# Patient Record
Sex: Male | Born: 1960 | Race: White | Hispanic: No | Marital: Married | Smoking: Never smoker
Health system: Southern US, Community
[De-identification: ages and names within clinical notes are randomized; demographics above are authoritative.]

## PROBLEM LIST (undated history)

## (undated) DIAGNOSIS — F410 Panic disorder [episodic paroxysmal anxiety] without agoraphobia: Secondary | ICD-10-CM

## (undated) HISTORY — PX: OTHER SURGICAL HISTORY: SHX169

## (undated) HISTORY — PX: KNEE ARTHROSCOPY W/ ACL RECONSTRUCTION: SHX1858

---

## 2002-05-28 ENCOUNTER — Emergency Department (HOSPITAL_COMMUNITY): Admission: EM | Admit: 2002-05-28 | Discharge: 2002-05-28 | Payer: Self-pay | Admitting: *Deleted

## 2002-05-28 ENCOUNTER — Encounter: Payer: Self-pay | Admitting: *Deleted

## 2003-01-08 ENCOUNTER — Encounter: Payer: Self-pay | Admitting: Orthopaedic Surgery

## 2003-01-08 ENCOUNTER — Ambulatory Visit (HOSPITAL_COMMUNITY): Admission: RE | Admit: 2003-01-08 | Discharge: 2003-01-08 | Payer: Self-pay | Admitting: Orthopaedic Surgery

## 2003-02-05 ENCOUNTER — Inpatient Hospital Stay (HOSPITAL_COMMUNITY): Admission: EM | Admit: 2003-02-05 | Discharge: 2003-02-06 | Payer: Self-pay | Admitting: Emergency Medicine

## 2003-02-05 ENCOUNTER — Encounter: Payer: Self-pay | Admitting: Emergency Medicine

## 2003-02-06 ENCOUNTER — Encounter: Payer: Self-pay | Admitting: Cardiology

## 2003-02-06 ENCOUNTER — Encounter: Payer: Self-pay | Admitting: Internal Medicine

## 2003-06-12 ENCOUNTER — Inpatient Hospital Stay (HOSPITAL_COMMUNITY): Admission: RE | Admit: 2003-06-12 | Discharge: 2003-06-13 | Payer: Self-pay | Admitting: Orthopaedic Surgery

## 2003-07-16 ENCOUNTER — Encounter: Payer: Self-pay | Admitting: Family Medicine

## 2003-07-16 ENCOUNTER — Ambulatory Visit (HOSPITAL_COMMUNITY): Admission: RE | Admit: 2003-07-16 | Discharge: 2003-07-16 | Payer: Self-pay | Admitting: Family Medicine

## 2003-11-20 ENCOUNTER — Ambulatory Visit (HOSPITAL_COMMUNITY): Admission: RE | Admit: 2003-11-20 | Discharge: 2003-11-20 | Payer: Self-pay | Admitting: Orthopaedic Surgery

## 2005-03-18 ENCOUNTER — Ambulatory Visit (HOSPITAL_COMMUNITY): Admission: RE | Admit: 2005-03-18 | Discharge: 2005-03-18 | Payer: Self-pay | Admitting: Internal Medicine

## 2005-03-18 ENCOUNTER — Ambulatory Visit: Payer: Self-pay | Admitting: Internal Medicine

## 2009-09-25 ENCOUNTER — Ambulatory Visit (HOSPITAL_COMMUNITY): Admission: RE | Admit: 2009-09-25 | Discharge: 2009-09-25 | Payer: Self-pay | Admitting: Family Medicine

## 2011-04-24 NOTE — Consult Note (Signed)
NAME:  Mario Meyer, Mario Meyer                          ACCOUNT NO.:  [id number]   MEDICAL RECORD NO.:  [id number]                   PATIENT TYPE:  INP   LOCATION:  1829                                 FACILITY:  [facility]   PHYSICIAN:  Olga Millers, M.D. [facility]            DATE OF BIRTH:  04-02-1961   DATE OF CONSULTATION:  02/05/2003  DATE OF DISCHARGE:                                   CONSULTATION   The patient is a 50 year old male whom I am asked to evaluate for chest  pain.  He has no prior cardiac history.  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  He denies any exertional chest pain, dyspnea on  exertion, orthopnea, PND, pedal edema, palpitations, or syncope.  The  patient states that yesterday morning at approximately 3 a.m., he developed  left-sided chest pain in the left breast and shoulder area.  The pain was  described as a sharp sensation without shortness of breath or diaphoresis.  There was nausea but no vomiting.  The pain was not pleuritic or positional  and not related to food.  It was not related to exertion.  It has been  continuous since that time and is now 24 hours in duration.  Because of  these symptoms, he presented to the emergency room and was admitted by the  teaching service.  We were asked to further evaluate.   MEDICATIONS PRIOR TO ADMISSION:  None.   ALLERGIES:  No known drug allergies.   SOCIAL HISTORY:  He does not smoke, nor does he consume alcohol.   FAMILY HISTORY:  Negative for coronary artery disease.  His mother does have  colon cancer.   PAST MEDICAL HISTORY:  There is no diabetes mellitus or hypertension.  There  is a history of mild hyperlipidemia by his report, but he does not have  numbers.  He has had prior knee surgery.   REVIEW OF SYSTEMS:  He denies any headaches or fevers or chills.  There is  no productive cough or hemoptysis.  There is no dysphagia, odynophagia,  melena, or hematochezia.  There is no dysuria or  hematuria.  There is no  seizure activity.  There is no orthopnea, PND, or pedal edema.  The  remainder of the Review of Systems is negative.   PHYSICAL EXAMINATION:  VITAL SIGNS:  Blood pressure 123/63 in the right arm  with a pulse of 72.  In the left, it is 107/57 with a pulse of 77.  He is  afebrile.  GENERAL:  He is well developed, well nourished in no acute distress.  He  does not appear to be depressed.  He does not have peripheral clubbing.  HEENT:  Unremarkable with normal eyelids.  NECK:  Supple with normal upstroke bilaterally and no bruits noted.  There  is no jugular venous distention or thyromegaly.  CHEST:  Clear to auscultation.  Normal expansion.  CARDIOVASCULAR:  Regular rate and rhythm with a normal S1 and S2.  There are  no murmurs, gallops, or rubs noted.  Of note, he is tender to palpation over  the left chest and shoulder area, and he states this is reproducing the pain  that he presented with.  ABDOMEN:  Nontender, nondistended.  Positive bowel sounds.  No  hepatosplenomegaly and no mass appreciated.  There is no abdominal bruit.  He has 2+ femoral pulses bilaterally with no bruits.  EXTREMITIES:  No edema, and I can palpate no cords.  He has 2+ dorsalis  pedis pulses bilaterally.  NEUROLOGIC:  Grossly intact.   LABORATORY DATA:  Electrocardiogram shows a normal sinus rhythm at a rate of  71.  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   His chest x-ray shows no edema.   His D-dimer is negative.  His initial enzymes are negative.  His hemoglobin  and hematocrit are 16 and 46, respectively.  Potassium 3.9, BUN 18,  creatinine 1.2.   DIAGNOSES:  1. Atypical chest pain.  2. History of mild hyperlipidemia.   PLAN:  The patient presents with chest pain that is very atypical for  ischemia.  It was reproduced with palpation of the chest, and I think it is  most consistent with musculoskeletal pain.  His initial set of  enzymes are  negative, and we are awaiting followup enzymes.  If they are negative, we  will plan an outpatient Cardiolite for risk stratification.  He may also  benefit from a nonsteroidal.  As noted, his D-dimer is negative.                                                Olga Millers, M.D. [facility]    BC/MEDQ  D:  02/05/2003  T:  02/05/2003  Job:  [id number]

## 2011-04-24 NOTE — Op Note (Signed)
NAME:  Mario Meyer, Mario Meyer                          ACCOUNT NO.:  [id number]   MEDICAL RECORD NO.:  [id number]                   PATIENT TYPE:  AMB   LOCATION:  DAY                                  FACILITY:  [facility]   PHYSICIAN:  J. Darreld Mclean, M.D.              DATE OF BIRTH:  [birth year]   DATE OF PROCEDURE:  DATE OF DISCHARGE:                                 OPERATIVE REPORT   PREOPERATIVE DIAGNOSIS:  Tear of rotator cuff on the right.   POSTOPERATIVE DIAGNOSIS:  Tear of rotator cuff on the right.   PROCEDURE:  Neer acromioplasty, rotator cuff repair, right.   SURGEON:  J. Darreld Mclean, M.D.   ASSISTANT:  Candace Cruise, P.A.   ANESTHESIA:  General.   INDICATIONS FOR PROCEDURE:  The patient has had pain and tenderness to his  shoulder since October, 2003.  MRI in February showed an infraspinatus  tendon tear.  The patient has had continued pain and continued tenderness  and is not getting any better, and surgery is now recommended.   PHYSICIAN'S ASSISTANT:  The use of a physician's assistant was indicated and  medically necessary, as I am the only orthopedic surgeon on staff.  There is  no house staff, and I needed someone to help me with manipulation of the arm  and help with the placement of the sutures.   DESCRIPTION OF PROCEDURE:  The patient was given general anesthesia.  He was  placed on the operating room table and placed in the semi barber position.  He was prepped and draped in the usual fashion.  Before doing anything else,  we verified that we were doing [name] and doing his right shoulder.   An incision was made between the acromion and the coracoid with careful  dissection, and the deltoid was exposed.  A suture was placed 5 cm below the  [facility] joint to avoid any possible injury to the axillary nerve.  A muscle-  splitting incision was made very carefully atraumatically.  The  coracoacromial ligament was identified and cut.  The shoulder was tight, but  we  were able to visualize.  The patient did not have very much downsloping  of the acromion clinically as noted on the MRI, but clinically it did not  seem to be that bad.  We used the broad-based osteotome and removed the part  of the acromion as described by Neer and then used a power rasp to smooth  down the area.  There was no obvious full-thickness tear.  The rotator cuff  was opened, and there was a tear on the undersurface of the infraspinatus.  This was then brought forward, sharply debrided, and then repaired in a  modified McLaughlin with alternating #1 Surgilon suture.  Good repair was  obtained.  The patient had a good range of motion.  The deltoid was  reapproximated with an interrupted figure-of-eight chromic.  The  subcutaneous tissue was closed using 2-0 plain and then a running  subcuticular 3-0 nylon with Steri-Strips applied.  A sterile bulky dressing  applied.   The patient tolerated the procedure well and went to the recovery room in  good condition.  He was admitted for pain control with a PCA pump.                                               Teola Bradley, M.D.    JWK/MEDQ  D:  06/12/2003  T:  06/12/2003  Job:  [id number]

## 2011-04-24 NOTE — Op Note (Signed)
NAME:  Mario Meyer, CID                ACCOUNT NO.:  [id number]   MEDICAL RECORD NO.:  [id number]          PATIENT TYPE:  AMB   LOCATION:  DAY                           FACILITY:  [facility]   PHYSICIAN:  R. Roetta Sessions, M.D. DATE OF BIRTH:  1961/08/21   DATE OF PROCEDURE:  03/18/2005  DATE OF DISCHARGE:                                 OPERATIVE REPORT   PROCEDURE PERFORMED:  Colonoscopy, high risk screening.   INDICATIONS FOR PROCEDURE:  Mr. Piscopo is a 50 year old gentleman with no  bowel symptoms and no prior colon imaging who was referred courtesy of Dr.  Jorene Guest in [place] for colorectal cancer screening.  Family history is  significant in that his mother was diagnosed with colorectal cancer at age  62 and ultimately died of metastatic colorectal cancer.  Colonoscopy is now  being done as a high risk screening maneuver.  This approach has been  discussed with the patient at length.  Potential risks, benefits and  alternatives have been reviewed, questions answered.   PROCEDURE NOTE:  Oxygen saturations, blood pressure, pulse and respirations  were monitored throughout the entire procedure.  Conscious sedation was  Versed 5 mg IV, Demerol 100 mg IV in divided doses.   INSTRUMENT USED:  Olympus video chip system.   FINDINGS:  Digital rectal exam revealed no abnormalities.   ENDOSCOPIC FINDINGS:  Prep was good.   Rectum:  Examination of rectal mucosa including retroflex view of the anal  verge revealed no abnormalities.  Colon:  Colonic mucosa was surveyed from the rectosigmoid junction.  The  left, transverse, right colon, area of appendiceal orifice, ileocecal valve  and cecum.  These structures were well seen and photographed for the record.  From this level the scope was slowly withdrawn.  All previously mentioned  mucosal surfaces were again seen.  The colonic mucosa appeared normal.  The  patient tolerated the procedure well and was reacted in endoscopy.   IMPRESSION:   Normal rectum,  normal colon.   RECOMMENDATIONS:  Repeat screening colonoscopy in five years.    RMR/MEDQ  D:  03/18/2005  T:  03/18/2005  Job:  [id number]   cc:   Dr. Darrow Bussing, [place], [place]

## 2011-04-24 NOTE — Discharge Summary (Signed)
NAME:  Mario Meyer, Mario Meyer NO.:  [id number]   MEDICAL RECORD NO.:  [id number]                   PATIENT TYPE:  INP   LOCATION:  3731                                 FACILITY:  [facility]   PHYSICIAN:  Thayer Headings, M.D.               DATE OF BIRTH:  [birth year]   DATE OF ADMISSION:  02/05/2003  DATE OF DISCHARGE:  02/06/2003                                 DISCHARGE SUMMARY   PRIMARY CARE PHYSICIAN:  Patient's primary care physician is at [facility].   CONSULTATIONS:  Dr. Olga Millers of [facility].   DISCHARGE DIAGNOSES:  1. Chest pain, noncardiac in etiology, presumed likely secondary to     esophageal or musculoskeletal disease.  2. Borderline low TSH, thyroid stimulating hormone.   DISCHARGE MEDICATIONS:  1. Protonix 40 mg p.o. daily.  2. Aspirin 325 mg p.o. daily.  3. Naproxen 500 mg p.o. b.i.d. p.r.n. pain.  4. Sublingual nitroglycerin 0.4 mg p.o. SL p.r.n. chest pain.   DISPOSITION:  The patient was discharged home in good condition on February 06, 2003.  He will have an exercise Cardiolite study on Friday, March 5 at 12:30  p.m. with [facility].  He will also have a follow up appointment  with Dr. Olga Millers on March 24 at 10:00 a.m.  The patient was also  instructed to contact [facility] for a follow up  appointment in the next several weeks for hospital follow up and to follow  his borderline low TSH.   PROCEDURE:  1. The patient had an electrocardiogram performed on admission which     revealed normal sinus rhythm with a left posterior fascicular block.  2. The patient had an AP portable chest x-ray performed which revealed     borderline cardiomegaly without edema and low lung volume but no acute     infiltrate.  3. The patient had a PA and lateral chest x-ray performed which revealed     that the heart size was at the upper limits of normal but was otherwise      unremarkable.  4. The patient had a transthoracic echocardiogram, the results of which are     pending at the time of dictation.   CONSULTATIONS:  A cardiology consult was obtained.  The consultant was Dr.  Olga Millers of [facility].  No other consultations were obtained  during the hospitalization.   HISTORY AND PHYSICAL:  The patient is a 50 year old white male with no known  cardiac risk factors who presented to [facility] ER after approximately a 24  hour history of substernal chest pain which was sharp and radiating to the  left shoulder which began about 3:00 a.m. on the morning of admission while  the patient was working but at the time it felt like indigestion.  It did  not resolve spontaneously and it  waxed and waned over the next 24 hours  without a complete resolution.  Just prior to admission the patient had  significant worsening of his chest pain which was unaccompanied by nausea,  vomiting, shortness of breath or diaphoresis.  EMS was called and gave  sublingual nitroglycerin x2, also aspirin and morphine for pain control and  the patient's pain diminished from a 10/10 to approximately a 5/10.  On  arriving to [facility] ER the patient was given sublingual nitroglycerin  again and started on a nitroglycerin drip with further improvement of his  chest pain to approximately a 3/10.   PAST MEDICAL HISTORY:  Noncontributory.   MEDICATIONS:  The patient was on no medications at the time of admission.   SOCIAL HISTORY:  He denied smoking, denied alcohol or drug abuse.   FAMILY HISTORY:  Patient's family history is negative for coronary artery  disease.   REVIEW OF SYSTEMS:  As per HPI and remarkable otherwise only for some left  hand pain.   PHYSICAL EXAMINATION:  VITALS:  Pulse 77, blood pressure 107/57, temperature  97.2, respirations 19.  He was sating 95% on room air.  GENERAL:  He was in no acute distress.  HEENT:  Within normal limits.  NECK:   Negative for bruits or JVD.  LUNGS:  Clear breath sounds bilaterally.  CARDIAC:  Regular rate and rhythm, without murmurs, rubs or gallops.  EXTREMITIES:  Peripheral pulses were 2+.  He had no clubbing, cyanosis or  edema of his extremities.  ABDOMEN:  Benign.  NEUROLOGIC:  Nonfocal.   LABORATORY DATA:  EKG at the time of admission showed normal sinus rhythm  with right axis deviation and a left posterior fascicular block but no acute  ischemic changes.  The patient had an initial set of cardiac enzymes which  were negative for ischemia or infarction however, the decision was made to  admit the patient with a diagnosis of chest pain and rule him out for a  myocardial infarction.   Admission laboratory studies revealed a sodium of 139, potassium 3.9,  chloride 107, bicarbonate 21, BUN 18, creatinine 1.2, glucose 104, white  blood cell count was 7.9, hemoglobin 15.3, hematocrit 43.8, platelets 248,  absolute neutrophil count was 3.9 and an MCV was 85.1.  His first set of  enzymes revealed a CK of 155, MB of  0.8, relative index 0.5 and a troponin  of 0.1.  A urine drug screen at that time was negative except for opioids  which had been received in the ER.   HOSPITAL COURSE:  1. CHEST PAIN, RULE OUT MYOCARDIAL INFARCTION.  The patient was admitted to     telemetry bed and monitoring.  He had cardiac enzymes x3 which were all     negative.  He had only one further episode of chest pain which was     relieved quickly by sublingual nitroglycerin and not associated with     shortness of breath, nausea, vomiting, diaphoresis.  The patient was     started on an aspirin and p.r.n. nitroglycerin and a cardiology consult     was obtained who felt that the patient's chest pain was not cardiac in     origin and likely represented musculoskeletal chest pain however, they     did wish to schedule the patient for an outpatient Cardiolite study for    cardiac risk factor stratification.  A fasting  lipid panel was obtained     however, the results of this  are pending at the time of dictation.  A     transthoracic echocardiogram was also obtained, report of which is     pending at the time of this dictation.  On hospital day number two the     patient was cleared by the cardiology consultants for discharge.  It was     again felt that the patient's chest pain was not cardiac in nature given     his lack of ischemic changes on his EKG and his three negative sets of     enzymes as well as the patient's lack of known cardiac risk factors.  2. BORDERLINE LOW TSH.  As per the admission, work up laboratory study     obtained revealed a thyroid stimulating hormone of 0.348 which is just     below the lower limits of normal of 0.35 however, the patient was not     clinically hyperthyroid and we will leave this to the primary care     physician to follow.   Discharge laboratory studies are as follows:  The patient's final set of  cardiac enzymes showed a CK of 161, an MB of 0.9 with a relative index of  0.6 and a troponin I that was less than 0.01.  Urinalysis was within normal  limits.  TSH was  0.348 which is just below the lower limit of normal.  A D-  dimer was negative at less than 0.22.  A triglyceride level was checked  which was high at 399.  HDL cholesterol normal at 54 and these are the final  laboratory results that are available at the time of this dictation.     Donnald Garre, M.D.    CL/MEDQ  D:  02/06/2003  T:  02/06/2003  Job:  [id number]   cc:   Olga Millers, M.D. [facility]   [facility]

## 2011-04-24 NOTE — H&P (Signed)
NAME:  Mario Meyer, Mario Meyer                          ACCOUNT NO.:  [id number]   MEDICAL RECORD NO.:  [id number]                   PATIENT TYPE:  AMB   LOCATION:  DAY                                  FACILITY:  [facility]   PHYSICIAN:  J. Darreld Mclean, M.D.              DATE OF BIRTH:  Jul 29, 1961   DATE OF ADMISSION:  DATE OF DISCHARGE:                                HISTORY & PHYSICAL   CHIEF COMPLAINT:  Right shoulder pain.   HISTORY OF PRESENT ILLNESS:  The patient is a 50 year old male with pain and  tenderness in his right shoulder.  He has a rotator cuff tear on the right.  He has gone through a long course of physical therapy.  I first saw him in  the office in January 2004, with complaints of pain and tenderness to the  right shoulder.  He said he had trouble beginning in October 2003, which  progressively got worse.  Obtained MRI of the shoulder on the right on  January 08, 2003, showing infraspinatus injury with edema and infraspinatus  muscle and tendon with a tear, infraspinatus tendon with no full-thickness  area.  The labrum looked normal, and had downsloping acromion and bursitis  was present.  The patient was treated conservatively, given Vicodin 5 and  Vioxx 50.  He went through a course of physical therapy and several  treatments, but his pain still continued.  At one time we talked about the  possibility of surgery.  He had an episode of chest pain in late April early  May.  He was evaluated for that and it was felt not to be due to his heart,  but to other reasons.  I injected his shoulder in May, pain continued.  He  is still having pain, still having tenderness, and desired to go ahead and  have surgery on the shoulder at this point.  Surgery is now scheduled for  June 12, 2003.   CURRENT MEDICATIONS:  1. Vioxx 50 mg.  2. Vicodin 5/500 mg.   ALLERGIES:  None.   REVIEW OF SYSTEMS:  Denies lung disease, kidney disease, hypertension,  diabetes, TB, rheumatic fever,  cancer, polio, ulcer disease, circulatory  problems.   HABITS:  He does not smoke or drink.   SOCIAL HISTORY:  He is a Engineer, maintenance ([employer]).  He is followed by [facility].  The patient is married, lives in [place], [place].   PAST SURGICAL HISTORY:  1. He has had surgery on his left foot.  2. Surgery on his right knee by me.  3. Cystic lesion on the left foot in 1999.  4. Dr. Romeo Apple did an ACL repair on the right knee in 2000.   PHYSICAL EXAMINATION:  VITAL SIGNS:  Blood pressure is 120/72, pulse 68,  respirations 16, afebrile, height 6 feet 1 inch, weight 204.  GENERAL:  The patient is alert, cooperative, and oriented.  HEENT:  Negative.  NECK:  Supple.  LUNGS:  Clear to P&A.  HEART:  Regular without murmur heard.  EXTREMITIES:  Right shoulder with decreased range of motion, pain, and  tenderness.  Internal rotation is full.  External rotation is only 5  degrees.  Abduction is 60 degrees with pain.  Forward flexion is 110.  Other  extremities within normal limits.  CNS:  Intact.  SKIN:  Intact.   IMPRESSION:  Rotator cuff tear on the right.   PLAN:  Repair of the rotator cuff on the right.  Discussed with the patient  the planned procedure an imponderables, and he appears to understand and  agrees with procedure as outlined.  Labs are pending.                                               Teola Bradley, M.D.    JWK/MEDQ  D:  06/08/2003  T:  06/08/2003  Job:  [id number]

## 2011-04-24 NOTE — Discharge Summary (Signed)
   NAME:  WING, SCHOCH                          ACCOUNT NO.:  [id number]   MEDICAL RECORD NO.:  [id number]                   PATIENT TYPE:  INP   LOCATION:  A332                                 FACILITY:  [facility]   PHYSICIAN:  J. Darreld Mclean, M.D.              DATE OF BIRTH:  Jun 08, 1961   DATE OF ADMISSION:  06/12/2003  DATE OF DISCHARGE:  06/13/2003                                 DISCHARGE SUMMARY   DISCHARGE DIAGNOSIS:  Rotator cuff tear on the right.   PROCEDURE PERFORMED:  Repair of rotator cuff on the right.   DISCHARGE STATUS:  Improved.   DISPOSITION:  Home.   DISCHARGE MEDICATIONS:  Tylox.   FOLLOW UP:  He is to be seen in the office in two weeks.  Keep shoulder  mobilizer on, elevate, sleep in semiupright position.  Keep the shoulder  wound dry.   HOSPITAL COURSE:  The patient was admitted after surgery, after undergoing  the above-mentioned procedure.  He tolerated this well and was on a PCA  pump.  The morning after surgery, he was doing well in very little pain.  He  was discharged home on the Tylox.  Instructions were given.  Care of the  wound dressing was given.   FOLLOW UP:  If he has any difficulties, he is to contact myself at the  office or hospital.   LABORATORY DATA:  Within normal limits.                                               Teola Bradley, M.D.    JWK/MEDQ  D:  06/19/2003  T:  06/19/2003  Job:  [id number]

## 2012-01-03 ENCOUNTER — Encounter (HOSPITAL_COMMUNITY): Payer: Self-pay | Admitting: *Deleted

## 2012-01-03 ENCOUNTER — Emergency Department (HOSPITAL_COMMUNITY)
Admission: EM | Admit: 2012-01-03 | Discharge: 2012-01-03 | Disposition: A | Discharge status: Home / Self Care | Payer: 59 | Attending: Emergency Medicine | Admitting: Emergency Medicine

## 2012-01-03 ENCOUNTER — Emergency Department (HOSPITAL_COMMUNITY): Payer: 59

## 2012-01-03 DIAGNOSIS — J3489 Other specified disorders of nose and nasal sinuses: Secondary | ICD-10-CM | POA: Insufficient documentation

## 2012-01-03 DIAGNOSIS — R059 Cough, unspecified: Secondary | ICD-10-CM | POA: Insufficient documentation

## 2012-01-03 DIAGNOSIS — M549 Dorsalgia, unspecified: Secondary | ICD-10-CM | POA: Insufficient documentation

## 2012-01-03 DIAGNOSIS — R071 Chest pain on breathing: Secondary | ICD-10-CM | POA: Insufficient documentation

## 2012-01-03 DIAGNOSIS — R05 Cough: Secondary | ICD-10-CM

## 2012-01-03 MED ORDER — HYDROCODONE-ACETAMINOPHEN 5-325 MG PO TABS: 1.0000 | ORAL_TABLET | ORAL | Status: AC | PRN

## 2012-01-03 MED ORDER — HYDROCODONE-ACETAMINOPHEN 5-325 MG PO TABS
1.0000 | ORAL_TABLET | Freq: Once | ORAL | Status: AC
  Administered 2012-01-03: 1 via ORAL
  Filled 2012-01-03: qty 1

## 2012-01-03 MED ORDER — PREDNISONE 10 MG PO TABS: 20.0000 mg | ORAL_TABLET | Freq: Every day | ORAL | Status: AC

## 2012-01-03 MED ORDER — PREDNISONE 20 MG PO TABS
60.0000 mg | ORAL_TABLET | Freq: Once | ORAL | Status: AC
  Administered 2012-01-03: 60 mg via ORAL
  Filled 2012-01-03: qty 3

## 2012-01-03 NOTE — ED Notes (Addendum)
Pt c/o cough and pain in the right side of his chest and his back. Pt just got over the flu, concerned he may have pneumonia.

## 2012-01-03 NOTE — ED Notes (Signed)
Pt taken to xray 

## 2012-01-04 NOTE — ED Provider Notes (Signed)
History     CSN: [id number]  Arrival date & time 01/03/12  0014   First MD Initiated Contact with Patient 01/03/12 0056      Chief Complaint  Patient presents with  . Cough  . Nasal Congestion  . Rib Injury  . Back Pain    (Consider location/radiation/quality/duration/timing/severity/associated sxs/prior treatment) HPI Mario Meyer is a 51 y.o. male who presents to the Emergency Department complaining of persistent cough x 2 weeks. He had the flu 3-4 weeks ago and has had a lingering cough since. He has right sided chest discomfort associated with the cough. He denies fever, chills, shortness of breath, nausea, vomiting,. He has tried OTC cough medicine and tylenol with no relief.  History reviewed. No pertinent past medical history.  Past Surgical History  Procedure Date  . Rotator cuff surgery     History reviewed. No pertinent family history.  History  Substance Use Topics  . Smoking status: Never Smoker   . Smokeless tobacco: Not on file  . Alcohol Use: No      Review of Systems 10 Systems reviewed and are negative for acute change except as noted in the HPI. Allergies  Review of patient's allergies indicates no known allergies.  Home Medications   Current Outpatient Rx  Name Route Sig Dispense Refill  . HYDROCODONE-ACETAMINOPHEN 5-325 MG PO TABS Oral Take 1 tablet by mouth every 4 (four) hours as needed for pain. 15 tablet 0  . PREDNISONE 10 MG PO TABS Oral Take 2 tablets (20 mg total) by mouth daily. 10 tablet 0    BP 137/79  Pulse 65  Temp(Src) 97.8 F (36.6 C) (Oral)  Resp 16  Ht 6\' 1"  (1.854 m)  Wt 210 lb (95.255 kg)  BMI 27.71 kg/m2  SpO2 97%  Physical Exam  Nursing note and vitals reviewed. Constitutional: He is oriented to person, place, and time. He appears well-developed and well-nourished. No distress.  HENT:  Head: Normocephalic.  Right Ear: External ear normal.  Left Ear: External ear normal.  Mouth/Throat: Oropharynx is clear  and moist.  Eyes: EOM are normal. Pupils are equal, round, and reactive to light.  Neck: Normal range of motion. Neck supple.  Cardiovascular: Normal rate, normal heart sounds and intact distal pulses.   Pulmonary/Chest: Effort normal and breath sounds normal. He has no wheezes. He has no rales. He exhibits tenderness.       Cough is coarse. Mild right sided chest wall pain  to palpation.  Abdominal: Soft. Bowel sounds are normal.  Musculoskeletal: Normal range of motion.  Neurological: He is alert and oriented to person, place, and time. He has normal reflexes.  Skin: Skin is warm and dry.    ED Course  Procedures (including critical care time)  Labs Reviewed - No data to display Dg Chest 2 View  01/03/2012  *RADIOLOGY REPORT*  Clinical Data: Cough.  Right chest pain.  CHEST - 2 VIEW  Comparison:  09/25/2009  Findings:  The heart size and mediastinal contours are within normal limits.  Both lungs are clear.  The visualized skeletal structures are unremarkable.  IMPRESSION: No active cardiopulmonary disease.  Original Report Authenticated By: Danae Orleans, M.D.     1. Cough       MDM  Persistent cough x 2 weeks. Initiated steroid therapy. Given analgesic.Pt stable in ED with no significant deterioration in condition.The patient appears reasonably screened and/or stabilized for discharge and I doubt any other medical condition or other [facility] requiring  further screening, evaluation, or treatment in the ED at this time prior to discharge.  MDM Reviewed: nursing note and vitals Interpretation: x-ray           Nicoletta Dress. Colon Branch, MD 01/05/12 1610

## 2012-05-11 ENCOUNTER — Other Ambulatory Visit (HOSPITAL_COMMUNITY): Payer: Self-pay | Admitting: Orthopaedic Surgery

## 2012-05-11 DIAGNOSIS — R52 Pain, unspecified: Secondary | ICD-10-CM

## 2012-05-18 ENCOUNTER — Ambulatory Visit (HOSPITAL_COMMUNITY): Payer: 59

## 2012-05-18 ENCOUNTER — Ambulatory Visit (HOSPITAL_COMMUNITY)
Admission: RE | Admit: 2012-05-18 | Discharge: 2012-05-18 | Disposition: A | Discharge status: Home / Self Care | Payer: 59 | Source: Ambulatory Visit | Attending: Orthopaedic Surgery | Admitting: Orthopaedic Surgery

## 2012-05-18 DIAGNOSIS — M67919 Unspecified disorder of synovium and tendon, unspecified shoulder: Secondary | ICD-10-CM | POA: Insufficient documentation

## 2012-05-18 DIAGNOSIS — M719 Bursopathy, unspecified: Secondary | ICD-10-CM | POA: Insufficient documentation

## 2012-05-18 DIAGNOSIS — M25519 Pain in unspecified shoulder: Secondary | ICD-10-CM | POA: Insufficient documentation

## 2012-05-18 DIAGNOSIS — R52 Pain, unspecified: Secondary | ICD-10-CM

## 2012-10-06 ENCOUNTER — Ambulatory Visit (HOSPITAL_COMMUNITY)
Admission: RE | Admit: 2012-10-06 | Discharge: 2012-10-06 | Disposition: A | Discharge status: Home / Self Care | Payer: 59 | Source: Ambulatory Visit | Attending: Family Medicine | Admitting: Family Medicine

## 2012-10-06 ENCOUNTER — Other Ambulatory Visit: Payer: Self-pay | Admitting: Family Medicine

## 2012-10-06 DIAGNOSIS — R0600 Dyspnea, unspecified: Secondary | ICD-10-CM

## 2012-10-06 DIAGNOSIS — R05 Cough: Secondary | ICD-10-CM | POA: Insufficient documentation

## 2012-10-06 DIAGNOSIS — R0989 Other specified symptoms and signs involving the circulatory and respiratory systems: Secondary | ICD-10-CM | POA: Insufficient documentation

## 2012-10-06 DIAGNOSIS — R0609 Other forms of dyspnea: Secondary | ICD-10-CM | POA: Insufficient documentation

## 2012-10-06 DIAGNOSIS — R059 Cough, unspecified: Secondary | ICD-10-CM | POA: Insufficient documentation

## 2013-06-19 ENCOUNTER — Emergency Department (HOSPITAL_COMMUNITY)
Admission: EM | Admit: 2013-06-19 | Discharge: 2013-06-19 | Disposition: A | Discharge status: Home / Self Care | Payer: 59 | Attending: Emergency Medicine | Admitting: Emergency Medicine

## 2013-06-19 ENCOUNTER — Emergency Department (HOSPITAL_COMMUNITY): Payer: 59

## 2013-06-19 ENCOUNTER — Encounter (HOSPITAL_COMMUNITY): Payer: Self-pay | Admitting: Emergency Medicine

## 2013-06-19 DIAGNOSIS — R0789 Other chest pain: Secondary | ICD-10-CM | POA: Insufficient documentation

## 2013-06-19 DIAGNOSIS — Z8659 Personal history of other mental and behavioral disorders: Secondary | ICD-10-CM | POA: Insufficient documentation

## 2013-06-19 HISTORY — DX: Panic disorder (episodic paroxysmal anxiety): F41.0

## 2013-06-19 LAB — BASIC METABOLIC PANEL
BUN: 17 mg/dL (ref 6–23)
CO2: 25 mEq/L (ref 19–32)
Calcium: 9.7 mg/dL (ref 8.4–10.5)
Chloride: 104 mEq/L (ref 96–112)
Creatinine, Ser: 0.75 mg/dL (ref 0.50–1.35)
GFR calc Af Amer: >90 mL/min (ref >90)
GFR calc non Af Amer: >90 mL/min (ref >90)
Glucose, Bld: 99 mg/dL (ref 70–99)
Potassium: 3.9 mEq/L (ref 3.5–5.1)
Sodium: 139 mEq/L (ref 135–145)

## 2013-06-19 LAB — CBC
HCT: 45.5 % (ref 39.0–52.0)
Hemoglobin: 16 g/dL (ref 13.0–17.0)
MCH: 28.8 pg (ref 26.0–34.0)
MCHC: 35.2 g/dL (ref 30.0–36.0)
MCV: 82 fL (ref 78.0–100.0)
Platelets: 265 10*3/uL (ref 150–400)
RBC: 5.55 MIL/uL (ref 4.22–5.81)
RDW: 13 % (ref 11.5–15.5)
WBC: 9.6 10*3/uL (ref 4.0–10.5)

## 2013-06-19 LAB — TROPONIN I: Troponin I: <0.3 ng/mL (ref <0.30)

## 2013-06-19 NOTE — ED Notes (Signed)
Patient reports is feeling better and ready to go home.

## 2013-06-19 NOTE — ED Notes (Signed)
Patient complaining of left-sided chest pain, jaw pain and tingling, and numbness and tingling to left arm. Patient states, "I just haven't been feeling good for a couple days."

## 2013-06-19 NOTE — ED Provider Notes (Signed)
History    CSN: [id number] Arrival date & time 06/19/13  8295  First MD Initiated Contact with Patient 06/19/13 [phone]     Chief Complaint  Patient presents with  . Chest Pain   (Consider location/radiation/quality/duration/timing/severity/associated sxs/prior Treatment) HPI HPI Comments: SERIGNE KUBICEK is a 52 y.o. male who presents to the Emergency Department complaining of chest discomfort that began at work tonight. He was recently promised a job at work and they gave the job away to a younger man with less experience. He was thinking about it all night while at work. He began to experience chest tightness. It was not associated with nausea or shortness of breath. Since arrival in the ER the tightness has left his chest.   PCP Dr. Gerda Diss  Past Medical History  Diagnosis Date  . Panic attack    Past Surgical History  Procedure Laterality Date  . Rotator cuff surgery    . Knee arthroscopy w/ acl reconstruction     History reviewed. No pertinent family history. History  Substance Use Topics  . Smoking status: Never Smoker   . Smokeless tobacco: Not on file  . Alcohol Use: No    Review of Systems  Respiratory: Positive for chest tightness.     Allergies  Review of patient's allergies indicates no known allergies.  Home Medications  No current outpatient prescriptions on file. BP 145/82  Pulse 80  Temp(Src) 97.9 F (36.6 C) (Oral)  Resp 23  Ht 6\' 1"  (1.854 m)  Wt 200 lb (90.719 kg)  BMI 26.39 kg/m2  SpO2 95% Physical Exam  Nursing note and vitals reviewed. Constitutional: He appears well-developed and well-nourished.  Awake, alert, nontoxic appearance.  HENT:  Head: Normocephalic and atraumatic.  Eyes: EOM are normal. Pupils are equal, round, and reactive to light.  Neck: Normal range of motion. Neck supple.  Cardiovascular: Normal rate and intact distal pulses.   Pulmonary/Chest: Effort normal and breath sounds normal. He exhibits no tenderness.   Abdominal: Soft. Bowel sounds are normal. There is no tenderness. There is no rebound.  Musculoskeletal: He exhibits no tenderness.  Baseline ROM, no obvious new focal weakness.  Neurological:  Mental status and motor strength appears baseline for patient and situation.  Skin: No rash noted.  Psychiatric: He has a normal mood and affect.    ED Course  Procedures (including critical care time) Results for orders placed during the hospital encounter of 06/19/13  CBC      Result Value Range   WBC 9.6  4.0 - 10.5 K/uL   RBC 5.55  4.22 - 5.81 MIL/uL   Hemoglobin 16.0  13.0 - 17.0 g/dL   HCT 08.6  57.8 - 46.9 %   MCV 82.0  78.0 - 100.0 fL   MCH 28.8  26.0 - 34.0 pg   MCHC 35.2  30.0 - 36.0 g/dL   RDW 62.9  52.8 - 41.3 %   Platelets 265  150 - 400 K/uL  BASIC METABOLIC PANEL      Result Value Range   Sodium 139  135 - 145 mEq/L   Potassium 3.9  3.5 - 5.1 mEq/L   Chloride 104  96 - 112 mEq/L   CO2 25  19 - 32 mEq/L   Glucose, Bld 99  70 - 99 mg/dL   BUN 17  6 - 23 mg/dL   Creatinine, Ser 2.44  0.50 - 1.35 mg/dL   Calcium 9.7  8.4 - 01.0 mg/dL   GFR calc non  Af Amer >90  >90 mL/min   GFR calc Af Amer >90  >90 mL/min  TROPONIN I      Result Value Range   Troponin I <0.30  <0.30 ng/mL   Dg Chest Portable 1 View  06/19/2013   *RADIOLOGY REPORT*  Clinical Data: Left-sided chest pain, jaw pain and tingling. Numbness and tingling in the left arm.  PORTABLE CHEST - 1 VIEW  Comparison: Chest radiograph performed 10/06/2012  Findings: The lungs are well-aerated.  Mild vascular congestion is noted.  There is no evidence of focal opacification, pleural effusion or pneumothorax.  The cardiomediastinal silhouette is borderline enlarged.  No acute osseous abnormalities are seen.  IMPRESSION: Mild vascular congestion and borderline cardiomegaly noted; no acute cardiopulmonary process seen.   Original Report Authenticated By: Tonia Ghent, M.D.   1. Chest discomfort     MDM  Patient with  chest tightness while at work. Labs are normal, Troponin is normal. EKG and chest xray are normal. Reviewed results with the patient. Pt stable in ED with no significant deterioration in condition.The patient appears reasonably screened and/or stabilized for discharge and I doubt any other medical condition or other [facility] requiring further screening, evaluation, or treatment in the ED at this time prior to discharge.  MDM Reviewed: nursing note and vitals Interpretation: labs, ECG and x-ray     Nicoletta Dress. Colon Branch, MD 06/19/13 [phone]

## 2013-06-19 NOTE — ED Provider Notes (Signed)
Date: 06/19/2013   0444  Rate: 72  Rhythm: normal sinus rhythm  QRS Axis: normal  Intervals: normal  ST/T Wave abnormalities: normal  Conduction Disutrbances: none  Narrative Interpretation: unremarkable     Nicoletta Dress. Colon Branch, MD 06/19/13 [phone]

## 2013-07-12 ENCOUNTER — Telehealth: Payer: Self-pay | Admitting: Family Medicine

## 2013-07-12 NOTE — Telephone Encounter (Signed)
Patient is calling to see if we can look at his ER report since he went to [facility] and just prescribe him something     [employer] Mario Meyer

## 2013-07-12 NOTE — Telephone Encounter (Signed)
Needs appt for tomorrow. [facility]

## 2013-07-12 NOTE — Telephone Encounter (Signed)
Left message to return call 

## 2013-07-21 NOTE — Telephone Encounter (Addendum)
Advised patient that he would need an office visit before prescribing meds.  Patient verbalized understanding.

## 2014-07-10 ENCOUNTER — Telehealth: Payer: Self-pay | Admitting: *Deleted

## 2014-07-10 NOTE — Telephone Encounter (Signed)
Pt called wanting to be seen for a red spot on his thigh per pt it has been there for 2 weeks, pt said he has had lime diease before in the past. Pt said he pulled a tick off about a month ago, pt said he feels drained. Does he need to be put in a same day appt. Please advise [phone]

## 2014-07-10 NOTE — Telephone Encounter (Signed)
Left message to return call 

## 2014-07-10 NOTE — Telephone Encounter (Signed)
Nurses patient either needs to be seen at the end of today or the same day slot for tomorrow

## 2014-07-11 NOTE — Telephone Encounter (Signed)
Left message to return call 

## 2014-07-12 ENCOUNTER — Ambulatory Visit (INDEPENDENT_AMBULATORY_CARE_PROVIDER_SITE_OTHER): Payer: 59 | Admitting: Family Medicine

## 2014-07-12 ENCOUNTER — Encounter: Payer: Self-pay | Admitting: Family Medicine

## 2014-07-12 VITALS — BP 122/88 | Temp 98.1°F | Ht 73.0 in | Wt 219.0 lb

## 2014-07-12 DIAGNOSIS — T148 Other injury of unspecified body region: Secondary | ICD-10-CM

## 2014-07-12 DIAGNOSIS — L259 Unspecified contact dermatitis, unspecified cause: Secondary | ICD-10-CM

## 2014-07-12 DIAGNOSIS — W57XXXA Bitten or stung by nonvenomous insect and other nonvenomous arthropods, initial encounter: Secondary | ICD-10-CM

## 2014-07-12 DIAGNOSIS — B356 Tinea cruris: Secondary | ICD-10-CM

## 2014-07-12 MED ORDER — DOXYCYCLINE HYCLATE 100 MG PO CAPS: 100.0000 mg | ORAL_CAPSULE | Freq: Two times a day (BID) | ORAL | Status: DC

## 2014-07-12 MED ORDER — KETOCONAZOLE 2 % EX CREA: 1.0000 "application " | TOPICAL_CREAM | Freq: Two times a day (BID) | CUTANEOUS | Status: AC

## 2014-07-12 MED ORDER — TRIAMCINOLONE ACETONIDE 0.1 % EX CREA: TOPICAL_CREAM | CUTANEOUS | Status: DC

## 2014-07-12 NOTE — Progress Notes (Signed)
   Subjective:    Patient ID: Mario Meyer, male    DOB: [birth year], 53 y.o.   MRN: [id number]  HPI Patient removed a tick from the right side of his groin about 3 weeks ago. He has a red spot from where the tick was.   He also has an itchy rash with small red bumps on his lower back that started around the same time. The rash gets worst with heat. He has used Benadryl cream w/ no relief.   No other s/s. PMH benign  Review of Systems No fevers no vomiting or diarrhea no sweats chills or other symptoms    Objective:   Physical Exam  He does have a ear edematous rash with central clearing and a little bit of scaling around the edges in the groin region this could be tinea rash could also be a tick related rash. Small area where the bite occurred on other part of body was noted but it was noninfected. Also he has an excoriated rash on his bottom that appears to be mainly irritation.      Assessment & Plan:  Contact dermatitis on the box steroid cream should help this  Possible tinea versus tick related doxycycline twice a day 10 days plus also Nizoral on a regular basis. Should gradually get better over the next 2 weeks

## 2014-07-12 NOTE — Telephone Encounter (Signed)
Office visit scheduled.

## 2014-07-12 NOTE — Telephone Encounter (Signed)
Office visit scheduled with Dr. Nicki Reaper today

## 2014-07-26 ENCOUNTER — Telehealth: Payer: Self-pay | Admitting: Family Medicine

## 2014-07-26 ENCOUNTER — Other Ambulatory Visit: Payer: Self-pay | Admitting: *Deleted

## 2014-07-26 MED ORDER — CLOTRIMAZOLE-BETAMETHASONE 1-0.05 % EX CREA: 1.0000 "application " | TOPICAL_CREAM | Freq: Two times a day (BID) | CUTANEOUS | Status: DC

## 2014-07-26 NOTE — Telephone Encounter (Signed)
Last seen 07/12/14 and prescribed ketoconazole (NIZORAL) 2 % cream 30 g  Apply 1 application topically 2 (two) times daily. - Topical triamcinolone cream (KENALOG) 0.1 % 45 g  Apply bid prn itching to buttocks.

## 2014-07-26 NOTE — Telephone Encounter (Signed)
Med sent to pharm. Pt notified on voicemail.  

## 2014-07-26 NOTE — Telephone Encounter (Signed)
Patient said that his rashes have not improved at all and in fact one may be worse. He wants to know if we can call something in stronger to help?   [employer]

## 2014-07-26 NOTE — Telephone Encounter (Signed)
lotrisone 60 g apply bid affected area one ref

## 2014-09-11 ENCOUNTER — Telehealth: Payer: Self-pay | Admitting: Family Medicine

## 2014-09-11 NOTE — Telephone Encounter (Signed)
Nurses, please do some background work to try to find out what that cream was then we can refill it depending on what it was. May need to check his paper chart and or electronic chart

## 2014-09-11 NOTE — Telephone Encounter (Signed)
According to Med list it was Lotrisone

## 2014-09-11 NOTE — Telephone Encounter (Signed)
Patient said that at one time we called in a cream for a rash that showed up on his behind. He said Dr. Nicki Reaper told him it was probably just a heat rash. He wants to know if we can call in the same cream that was sent in for him when he was seen for this.  [employer]

## 2014-09-12 MED ORDER — CLOTRIMAZOLE-BETAMETHASONE 1-0.05 % EX CREA: 1.0000 "application " | TOPICAL_CREAM | Freq: Two times a day (BID) | CUTANEOUS | Status: DC

## 2014-09-12 NOTE — Telephone Encounter (Signed)
Rx sent electronically to pharmacy. Patient notified. 

## 2014-09-12 NOTE — Telephone Encounter (Signed)
May refill 30 g tube ,use bid prn , 2 refills

## 2014-12-19 ENCOUNTER — Ambulatory Visit (INDEPENDENT_AMBULATORY_CARE_PROVIDER_SITE_OTHER): Payer: 59 | Admitting: Family Medicine

## 2014-12-19 ENCOUNTER — Encounter: Payer: Self-pay | Admitting: Family Medicine

## 2014-12-19 VITALS — BP 118/68 | Ht 73.0 in | Wt 226.0 lb

## 2014-12-19 DIAGNOSIS — R197 Diarrhea, unspecified: Secondary | ICD-10-CM

## 2014-12-19 DIAGNOSIS — Z0000 Encounter for general adult medical examination without abnormal findings: Secondary | ICD-10-CM

## 2014-12-19 NOTE — Progress Notes (Signed)
   Subjective:    Patient ID: Mario Meyer, male    DOB: 03/11/1961, 54 y.o.   MRN: [id number]  HPI The patient comes in today for a wellness visit. A review of their health history was completed.    A review of medications was also completed.  Any needed refills: No  Eating habits: Health conscious  Falls/  MVA accidents in past few months: No  Regular exercise: Walking, working, Programmer, systems pt sees on regular basis: No  Preventative health issues were discussed.   Additional concerns: No  He does a lot of training with dogs. He is concerned about the possibility of acquiring worms he would like his stool tests for worms  Review of Systems  Constitutional: Negative for fever, activity change and appetite change.  HENT: Negative for congestion and rhinorrhea.   Eyes: Negative for discharge.  Respiratory: Negative for cough and wheezing.   Cardiovascular: Negative for chest pain.  Gastrointestinal: Negative for vomiting, abdominal pain and blood in stool.  Genitourinary: Negative for frequency and difficulty urinating.  Musculoskeletal: Negative for neck pain.  Skin: Negative for rash.  Allergic/Immunologic: Negative for environmental allergies and food allergies.  Neurological: Negative for weakness and headaches.  Psychiatric/Behavioral: Negative for agitation.       Objective:   Physical Exam  Constitutional: He appears well-developed and well-nourished.  HENT:  Head: Normocephalic and atraumatic.  Right Ear: External ear normal.  Left Ear: External ear normal.  Nose: Nose normal.  Mouth/Throat: Oropharynx is clear and moist.  Eyes: EOM are normal. Pupils are equal, round, and reactive to light.  Neck: Normal range of motion. Neck supple. No thyromegaly present.  Cardiovascular: Normal rate, regular rhythm and normal heart sounds.   No murmur heard. Pulmonary/Chest: Effort normal and breath sounds normal. No respiratory distress. He has no wheezes.    Abdominal: Soft. Bowel sounds are normal. He exhibits no distension and no mass. There is no tenderness.  Genitourinary: Penis normal.  Musculoskeletal: Normal range of motion. He exhibits no edema.  Lymphadenopathy:    He has no cervical adenopathy.  Neurological: He is alert. He exhibits normal muscle tone.  Skin: Skin is warm and dry. No erythema.  Psychiatric: He has a normal mood and affect. His behavior is normal. Judgment normal.   Prostate normal Toenails- fungal      Assessment & Plan:  Wellness exam was completed overall patient doing fine. He will do lab work. Safety measures dietary measures discussed. The importance of exercise and discuss. Also the importance of getting a colonoscopy. He has had his previous colonoscopy completed and he is understanding that he needs to go ahead and repeat he has a family history of colon cancer  ONP 1  Lab work was recommended. He will do this.

## 2014-12-23 LAB — BASIC METABOLIC PANEL
BUN: 15 mg/dL (ref 6–23)
CO2: 26 mEq/L (ref 19–32)
Calcium: 9.1 mg/dL (ref 8.4–10.5)
Chloride: 108 mEq/L (ref 96–112)
Creat: 0.87 mg/dL (ref 0.50–1.35)
Glucose, Bld: 92 mg/dL (ref 70–99)
Potassium: 4.8 mEq/L (ref 3.5–5.3)
Sodium: 143 mEq/L (ref 135–145)

## 2014-12-23 LAB — LIPID PANEL
Cholesterol: 215 mg/dL — ABNORMAL HIGH (ref 0–200)
HDL: 63 mg/dL (ref >39)
LDL Cholesterol: 128 mg/dL — ABNORMAL HIGH (ref 0–99)
Total CHOL/HDL Ratio: 3.4 Ratio
Triglycerides: 118 mg/dL (ref <150)
VLDL: 24 mg/dL (ref 0–40)

## 2014-12-24 LAB — PSA: PSA: 2.2 ng/mL (ref <=4.00)

## 2014-12-25 LAB — OVA AND PARASITE EXAMINATION: OP: NONE SEEN

## 2015-03-27 ENCOUNTER — Ambulatory Visit (INDEPENDENT_AMBULATORY_CARE_PROVIDER_SITE_OTHER): Payer: 59 | Admitting: Family Medicine

## 2015-03-27 ENCOUNTER — Encounter: Payer: Self-pay | Admitting: Family Medicine

## 2015-03-27 VITALS — BP 122/80 | Temp 98.0°F | Ht 73.0 in | Wt 229.4 lb

## 2015-03-27 DIAGNOSIS — J329 Chronic sinusitis, unspecified: Secondary | ICD-10-CM

## 2015-03-27 DIAGNOSIS — J4521 Mild intermittent asthma with (acute) exacerbation: Secondary | ICD-10-CM | POA: Diagnosis not present

## 2015-03-27 DIAGNOSIS — J683 Other acute and subacute respiratory conditions due to chemicals, gases, fumes and vapors: Secondary | ICD-10-CM

## 2015-03-27 MED ORDER — CLARITHROMYCIN 500 MG PO TABS: 500.0000 mg | ORAL_TABLET | Freq: Two times a day (BID) | ORAL | Status: DC

## 2015-03-27 MED ORDER — ALBUTEROL SULFATE HFA 108 (90 BASE) MCG/ACT IN AERS: 2.0000 | INHALATION_SPRAY | Freq: Four times a day (QID) | RESPIRATORY_TRACT | Status: DC | PRN

## 2015-03-27 MED ORDER — PREDNISONE 20 MG PO TABS: ORAL_TABLET | ORAL | Status: DC

## 2015-03-27 NOTE — Progress Notes (Signed)
   Subjective:    Patient ID: Mario Meyer, male    DOB: 1961/04/21, 54 y.o.   MRN: [id number]  Cough This is a new problem. The current episode started in the past 7 days. The problem has been gradually worsening. The problem occurs constantly. The cough is productive of sputum. Associated symptoms include chest pain, a fever, headaches, a sore throat, shortness of breath and wheezing. Associated symptoms comments: Runny nose. Nothing aggravates the symptoms. Treatments tried: Mucinex. The treatment provided no relief.   No other concerns at this time.    Bronchial cong and yell disch  Hx of inhaler [place] e in the past   Review of Systems  Constitutional: Positive for fever.  HENT: Positive for sore throat.   Respiratory: Positive for cough, shortness of breath and wheezing.   Cardiovascular: Positive for chest pain.  Neurological: Positive for headaches.       Objective:   Physical Exam Alert mild malaise. Vital stable HEENT moderate his congestion pharynx normal lungs bilateral wheezes intermittent cough during exam heart regular in rhythm.       Assessment & Plan:  Impression rhinosinusitis/bronchitis with exacerbation of reactive airways plan prednisone taper albuterol when necessary. Antibiotics prescribed. Symptomatic care discussed WSL

## 2015-08-27 ENCOUNTER — Telehealth: Payer: Self-pay | Admitting: Internal Medicine

## 2015-08-27 NOTE — Telephone Encounter (Signed)
Dr Wolfgang Phoenix told patient he is due his next colonoscopy. Last one done in 2006. Patient can be reached at [phone]

## 2015-08-28 NOTE — Telephone Encounter (Signed)
LMOM to call.

## 2015-08-29 ENCOUNTER — Other Ambulatory Visit: Payer: Self-pay

## 2015-08-29 DIAGNOSIS — Z1211 Encounter for screening for malignant neoplasm of colon: Secondary | ICD-10-CM

## 2015-08-29 NOTE — Telephone Encounter (Signed)
Gastroenterology Pre-Procedure Review  Request Date: 08/27/2015 Requesting Physician: Dr. Wolfgang Phoenix  PATIENT REVIEW QUESTIONS: The patient responded to the following health history questions as indicated:    Pt's last colonoscopy was 03/18/2005 by Dr. Gala Romney He has a family hx of colon cancer ( father diet at age 53 from metastatic colerectal cancer  1. Diabetes Melitis: no 2. Joint replacements in the past 12 months: no 3. Major health problems in the past 3 months: no 4. Has an artificial valve or MVP: no 5. Has a defibrillator: no 6. Has been advised in past to take antibiotics in advance of a procedure like teeth cleaning: no    MEDICATIONS & ALLERGIES:    Patient reports the following regarding taking any blood thinners:   Plavix? no Aspirin? no Coumadin? no  Patient confirms/reports the following medications:  Current Outpatient Prescriptions  Medication Sig Dispense Refill  . albuterol (PROVENTIL HFA;VENTOLIN HFA) 108 (90 BASE) MCG/ACT inhaler Inhale 2 puffs into the lungs 4 (four) times daily as needed for wheezing or shortness of breath. (Patient not taking: Reported on 08/28/2015) 1 Inhaler 0  . clarithromycin (BIAXIN) 500 MG tablet Take 1 tablet (500 mg total) by mouth 2 (two) times daily. For 10 days (Patient not taking: Reported on 08/28/2015) 20 tablet 0  . predniSONE (DELTASONE) 20 MG tablet Take 3 tabs qd x 3 days, 2 tabs qd x 3 days, 1 tab qd x 2 days (Patient not taking: Reported on 08/28/2015) 17 tablet 0   No current facility-administered medications for this visit.    Patient confirms/reports the following allergies:  No Known Allergies  No orders of the defined types were placed in this encounter.    AUTHORIZATION INFORMATION Primary Insurance:   ID #:   Group #:  Pre-Cert / Auth required:  Pre-Cert / Auth #:   Secondary Insurance:  ID #:   Group #:  Pre-Cert / Auth required:  Pre-Cert / Auth #:   SCHEDULE INFORMATION: Procedure has been scheduled as  follows:  Date: 10/04/2015              Time: 7:30 PM Location: [facility] Short Stay  This Gastroenterology Pre-Precedure Review Form is being routed to the following provider(s): R. Garfield Cornea, MD

## 2015-08-30 MED ORDER — PEG 3350-KCL-NA BICARB-NACL 420 G PO SOLR: 4000.0000 mL | ORAL | Status: DC

## 2015-08-30 NOTE — Addendum Note (Signed)
Addended by: Everardo All on: 08/30/2015 11:38 AM   Modules accepted: Orders

## 2015-08-30 NOTE — Telephone Encounter (Signed)
Rx was sent to the pharmacy and instructions mailed to pt.  

## 2015-08-30 NOTE — Telephone Encounter (Signed)
Appropriate.

## 2015-09-12 ENCOUNTER — Telehealth: Payer: Self-pay

## 2015-09-12 NOTE — Telephone Encounter (Signed)
I called [employer] @ [phone] and spoke to [facility] who said a PA is required for the screening colonoscopy as outpatient. Reference # [id number].

## 2015-09-19 NOTE — Telephone Encounter (Signed)
PA for colonoscopy approved and scanned into epic.

## 2015-10-02 ENCOUNTER — Telehealth: Payer: Self-pay

## 2015-10-02 NOTE — Telephone Encounter (Signed)
I called pt and left Vm for a return call to update meds prior to procedure on 10/04/2015.

## 2015-10-03 NOTE — Telephone Encounter (Signed)
FYI to Walden Field, NP in Anna's absence.

## 2015-10-03 NOTE — Telephone Encounter (Signed)
Pt called and has not had any changes in his meds.

## 2015-10-04 ENCOUNTER — Encounter (HOSPITAL_COMMUNITY)
Admission: RE | Disposition: A | Discharge status: Home / Self Care | Payer: Self-pay | Source: Ambulatory Visit | Attending: Internal Medicine

## 2015-10-04 ENCOUNTER — Ambulatory Visit (HOSPITAL_COMMUNITY)
Admission: RE | Admit: 2015-10-04 | Discharge: 2015-10-04 | Disposition: A | Discharge status: Home / Self Care | Payer: 59 | Source: Ambulatory Visit | Attending: Internal Medicine | Admitting: Internal Medicine

## 2015-10-04 ENCOUNTER — Encounter (HOSPITAL_COMMUNITY): Payer: Self-pay | Admitting: *Deleted

## 2015-10-04 DIAGNOSIS — Z86010 Personal history of colonic polyps: Secondary | ICD-10-CM | POA: Insufficient documentation

## 2015-10-04 DIAGNOSIS — Z1211 Encounter for screening for malignant neoplasm of colon: Secondary | ICD-10-CM | POA: Insufficient documentation

## 2015-10-04 DIAGNOSIS — D125 Benign neoplasm of sigmoid colon: Secondary | ICD-10-CM | POA: Insufficient documentation

## 2015-10-04 DIAGNOSIS — Z800 Family history of malignant neoplasm of digestive organs: Secondary | ICD-10-CM | POA: Diagnosis not present

## 2015-10-04 DIAGNOSIS — K621 Rectal polyp: Secondary | ICD-10-CM | POA: Diagnosis not present

## 2015-10-04 HISTORY — PX: COLONOSCOPY: SHX5424

## 2015-10-04 SURGERY — COLONOSCOPY
Anesthesia: Moderate Sedation

## 2015-10-04 MED ORDER — ONDANSETRON HCL 4 MG/2ML IJ SOLN
INTRAMUSCULAR | Status: DC | PRN
  Administered 2015-10-04: 4 mg via INTRAVENOUS

## 2015-10-04 MED ORDER — MEPERIDINE HCL 100 MG/ML IJ SOLN
INTRAMUSCULAR | Status: AC
  Filled 2015-10-04: qty 2

## 2015-10-04 MED ORDER — MEPERIDINE HCL 100 MG/ML IJ SOLN
INTRAMUSCULAR | Status: DC | PRN
  Administered 2015-10-04 (×2): 25 mg via INTRAVENOUS
  Administered 2015-10-04: 50 mg via INTRAVENOUS

## 2015-10-04 MED ORDER — MIDAZOLAM HCL 5 MG/5ML IJ SOLN
INTRAMUSCULAR | Status: DC | PRN
Start: 2015-10-04 — End: 2015-10-04
  Administered 2015-10-04: 2 mg via INTRAVENOUS
  Administered 2015-10-04: 1 mg via INTRAVENOUS
  Administered 2015-10-04: 2 mg via INTRAVENOUS
  Administered 2015-10-04: 1 mg via INTRAVENOUS

## 2015-10-04 MED ORDER — ONDANSETRON HCL 4 MG/2ML IJ SOLN
INTRAMUSCULAR | Status: AC
  Filled 2015-10-04: qty 2

## 2015-10-04 MED ORDER — SODIUM CHLORIDE 0.9 % IV SOLN
INTRAVENOUS | Status: DC
  Administered 2015-10-04: 1000 mL via INTRAVENOUS

## 2015-10-04 MED ORDER — STERILE WATER FOR IRRIGATION IR SOLN
Status: DC | PRN
  Administered 2015-10-04: 08:00:00

## 2015-10-04 MED ORDER — MIDAZOLAM HCL 5 MG/5ML IJ SOLN
INTRAMUSCULAR | Status: AC
  Filled 2015-10-04: qty 10

## 2015-10-04 NOTE — H&P (Signed)
@LOGO @   Primary Care Physician:  Sallee Lange, MD Primary Gastroenterologist:  Dr. Gala Romney  Pre-Procedure History & Physical: HPI:  Mario Meyer is a 54 y.o. male is here for a screening colonoscopy. Mother passed away with colon cancer in her 56s. No bowel symptoms. Negative colonoscopy 10 years ago.  Past Medical History  Diagnosis Date  . Panic attack     Past Surgical History  Procedure Laterality Date  . Rotator cuff surgery    . Knee arthroscopy w/ acl reconstruction      Prior to Admission medications   Medication Sig Start Date End Date Taking? Authorizing Provider  polyethylene glycol-electrolytes (TRILYTE) 420 G solution Take 4,000 mLs by mouth as directed. 08/30/15  Yes Daneil Dolin, MD  albuterol (PROVENTIL HFA;VENTOLIN HFA) 108 (90 BASE) MCG/ACT inhaler Inhale 2 puffs into the lungs 4 (four) times daily as needed for wheezing or shortness of breath. Patient not taking: Reported on 08/28/2015 03/27/15   Mikey Kirschner, MD  clarithromycin (BIAXIN) 500 MG tablet Take 1 tablet (500 mg total) by mouth 2 (two) times daily. For 10 days Patient not taking: Reported on 08/28/2015 03/27/15   Mikey Kirschner, MD  predniSONE (DELTASONE) 20 MG tablet Take 3 tabs qd x 3 days, 2 tabs qd x 3 days, 1 tab qd x 2 days Patient not taking: Reported on 08/28/2015 03/27/15   Mikey Kirschner, MD    Allergies as of 08/29/2015  . (No Known Allergies)    History reviewed. No pertinent family history.  Social History   Social History  . Marital Status: Married    Spouse Name: N/A  . Number of Children: N/A  . Years of Education: N/A   Occupational History  . Not on file.   Social History Main Topics  . Smoking status: Never Smoker   . Smokeless tobacco: Not on file  . Alcohol Use: No  . Drug Use: No  . Sexual Activity: Not on file   Other Topics Concern  . Not on file   Social History Narrative    Review of Systems: See HPI, otherwise negative ROS  Physical  Exam: BP 127/89 mmHg  Pulse 63  Temp(Src) 97.6 F (36.4 C) (Oral)  Resp 13  Ht 6\' 1"  (1.854 m)  Wt 220 lb (99.791 kg)  BMI 29.03 kg/m2  SpO2 97% General:   Alert,  Well-developed, well-nourished, pleasant and cooperative in NAD Head:  Normocephalic and atraumatic. Eyes:  Sclera clear, no icterus.   Conjunctiva pink. Ears:  Normal auditory acuity. Nose:  No deformity, discharge,  or lesions. Mouth:  No deformity or lesions, dentition normal. Neck:  Supple; no masses or thyromegaly. Lungs:  Clear throughout to auscultation.   No wheezes, crackles, or rhonchi. No acute distress. Heart:  Regular rate and rhythm; no murmurs, clicks, rubs,  or gallops. Abdomen:  Soft, nontender and nondistended. No masses, hepatosplenomegaly or hernias noted. Normal bowel sounds, without guarding, and without rebound.   Msk:  Symmetrical without gross deformities. Normal posture. Pulses:  Normal pulses noted. Extremities:  Without clubbing or edema.  Impression/Plan: Mario Meyer is now here to undergo a screening colonoscopy.  High-risk screening examination. Risks, benefits, limitations, imponderables and alternatives regarding colonoscopy have been reviewed with the patient. Questions have been answered. All parties agreeable.     Notice:  This dictation was prepared with Dragon dictation along with smaller phrase technology. Any transcriptional errors that result from this process are unintentional and may not be  corrected upon review.

## 2015-10-04 NOTE — Op Note (Signed)
[facility] [address] [place], [address]   COLONOSCOPY PROCEDURE REPORT  PATIENT: Warnell, Rasnic  MR#: [id number] BIRTHDATE: 1961-08-02 , 54  yrs. old GENDER: male ENDOSCOPIST: R.  Garfield Cornea, MD FACP [facility] REFERRED RF:[facility] Wolfgang Phoenix, M.D. PROCEDURE DATE:  Nov 01, 2015 PROCEDURE:   Colonoscopy with snare polypectomy INDICATIONS:High risk colorectal cancer screening (mother with colon cancer). MEDICATIONS: Versed 6 mg IV and Demerol 100 mg IV in divided doses. Zofran 4 mg IV. ASA CLASS:       Class II  CONSENT: The risks, benefits, alternatives and imponderables including but not limited to bleeding, perforation as well as the possibility of a missed lesion have been reviewed.  The potential for biopsy, lesion removal, etc. have also been discussed. Questions have been answered.  All parties agreeable.  Please see the history and physical in the medical record for more information.  DESCRIPTION OF PROCEDURE:   After the risks benefits and alternatives of the procedure were thoroughly explained, informed consent was obtained.  The digital rectal exam revealed no abnormalities of the rectum.   The EC-[id number] ([id number])  endoscope was introduced through the anus and advanced to the cecum, which was identified by both the appendix and ileocecal valve. No adverse events experienced.   The quality of the prep was adequate  The instrument was then slowly withdrawn as the colon was fully examined. Estimated blood loss is zero unless otherwise noted in this procedure report.      COLON FINDINGS: (1) 5 mm polyp in the rectum at 8 cm from anal verge; otherwise, normal-appearing rectal mucosa.  (1) 8 mm pedunculated polyp in the midsigmoid segment; otherwise, the remainder of the colonic mucosa appeared normal.  The above-mentioned polyps were cold snare and hot snare removed, respectively.  Retroflexion was performed. .  Withdrawal time=  .  The scope was  withdrawn and the procedure completed. COMPLICATIONS: There were no immediate complications. EBL 2 mL ENDOSCOPIC IMPRESSION: Rectal and colonic polyps?"status post removal as described above.  RECOMMENDATIONS: Follow-up pathology.  eSigned:  R. Garfield Cornea, MD Rosalita Chessman [facility] November 01, 2015 8:31 AM   cc:  CPT CODES: ICD CODES:  The ICD and CPT codes recommended by this software are interpretations from the data that the clinical staff has captured with the software.  The verification of the translation of this report to the ICD and CPT codes and modifiers is the sole responsibility of the health care institution and practicing physician where this report was generated.  [employer] will not be held responsible for the validity of the ICD and CPT codes included on this report.  AMA assumes no liability for data contained or not contained herein. CPT is a Designer, television/film set of the [employer].  PATIENT NAME:  Nathyn, Luiz MR#: [id number]

## 2015-10-04 NOTE — Discharge Instructions (Signed)
°Colonoscopy °Discharge Instructions ° °Read the instructions outlined below and refer to this sheet in the next few weeks. These discharge instructions provide you with general information on caring for yourself after you leave the hospital. Your doctor may also give you specific instructions. While your treatment has been planned according to the most current medical practices available, unavoidable complications occasionally occur. If you have any problems or questions after discharge, call Dr. Rourk at [phone]. °ACTIVITY °· You may resume your regular activity, but move at a slower pace for the next 24 hours.  °· Take frequent rest periods for the next 24 hours.  °· Walking will help get rid of the air and reduce the bloated feeling in your belly (abdomen).  °· No driving for 24 hours (because of the medicine (anesthesia) used during the test).   °· Do not sign any important legal documents or operate any machinery for 24 hours (because of the anesthesia used during the test).  °NUTRITION °· Drink plenty of fluids.  °· You may resume your normal diet as instructed by your doctor.  °· Begin with a light meal and progress to your normal diet. Heavy or fried foods are harder to digest and may make you feel sick to your stomach (nauseated).  °· Avoid alcoholic beverages for 24 hours or as instructed.  °MEDICATIONS °· You may resume your normal medications unless your doctor tells you otherwise.  °WHAT YOU CAN EXPECT TODAY °· Some feelings of bloating in the abdomen.  °· Passage of more gas than usual.  °· Spotting of blood in your stool or on the toilet paper.  °IF YOU HAD POLYPS REMOVED DURING THE COLONOSCOPY: °· No aspirin products for 7 days or as instructed.  °· No alcohol for 7 days or as instructed.  °· Eat a soft diet for the next 24 hours.  °FINDING OUT THE RESULTS OF YOUR TEST °Not all test results are available during your visit. If your test results are not back during the visit, make an appointment  with your caregiver to find out the results. Do not assume everything is normal if you have not heard from your caregiver or the medical facility. It is important for you to follow up on all of your test results.  °SEEK IMMEDIATE MEDICAL ATTENTION IF: °· You have more than a spotting of blood in your stool.  °· Your belly is swollen (abdominal distention).  °· You are nauseated or vomiting.  °· You have a temperature over 101.  °· You have abdominal pain or discomfort that is severe or gets worse throughout the day.  ° °Polyp information provided ° °Further recommendations to follow pending review of pathology report ° °Colon Polyps °Polyps are lumps of extra tissue growing inside the body. Polyps can grow in the large intestine (colon). Most colon polyps are noncancerous (benign). However, some colon polyps can become cancerous over time. Polyps that are larger than a pea may be harmful. To be safe, caregivers remove and test all polyps. °CAUSES  °Polyps form when mutations in the genes cause your cells to grow and divide even though no more tissue is needed. °RISK FACTORS °There are a number of risk factors that can increase your chances of getting colon polyps. They include: °· Being older than 50 years. °· Family history of colon polyps or colon cancer. °· Long-term colon diseases, such as colitis or Crohn disease. °· Being overweight. °· Smoking. °· Being inactive. °· Drinking too much alcohol. °SYMPTOMS  °  Most small polyps do not cause symptoms. If symptoms are present, they may include: °· Blood in the stool. The stool may look dark red or black. °· Constipation or diarrhea that lasts longer than 1 week. °DIAGNOSIS °People often do not know they have polyps until their caregiver finds them during a regular checkup. Your caregiver can use 4 tests to check for polyps: °· Digital rectal exam. The caregiver wears gloves and feels inside the rectum. This test would find polyps only in the rectum. °· Barium enema.  The caregiver puts a liquid called barium into your rectum before taking X-rays of your colon. Barium makes your colon look white. Polyps are dark, so they are easy to see in the X-ray pictures. °· Sigmoidoscopy. A thin, flexible tube (sigmoidoscope) is placed into your rectum. The sigmoidoscope has a light and tiny camera in it. The caregiver uses the sigmoidoscope to look at the last third of your colon. °· Colonoscopy. This test is like sigmoidoscopy, but the caregiver looks at the entire colon. This is the most common method for finding and removing polyps. °TREATMENT  °Any polyps will be removed during a sigmoidoscopy or colonoscopy. The polyps are then tested for cancer. °PREVENTION  °To help lower your risk of getting more colon polyps: °· Eat plenty of fruits and vegetables. Avoid eating fatty foods. °· Do not smoke. °· Avoid drinking alcohol. °· Exercise every day. °· Lose weight if recommended by your caregiver. °· Eat plenty of calcium and folate. Foods that are rich in calcium include milk, cheese, and broccoli. Foods that are rich in folate include chickpeas, kidney beans, and spinach. °HOME CARE INSTRUCTIONS °Keep all follow-up appointments as directed by your caregiver. You may need periodic exams to check for polyps. °SEEK MEDICAL CARE IF: °You notice bleeding during a bowel movement. °  °This information is not intended to replace advice given to you by your health care provider. Make sure you discuss any questions you have with your health care provider. °  °Document Released: 08/19/2004 Document Revised: 12/14/2014 Document Reviewed: 02/02/2012 °Elsevier Interactive Patient Education ©2016 Elsevier Inc. ° °

## 2015-10-08 ENCOUNTER — Encounter: Payer: Self-pay | Admitting: Internal Medicine

## 2015-10-09 ENCOUNTER — Encounter (HOSPITAL_COMMUNITY): Payer: Self-pay | Admitting: Internal Medicine

## 2016-03-03 ENCOUNTER — Telehealth: Payer: Self-pay | Admitting: Family Medicine

## 2016-03-03 MED ORDER — OSELTAMIVIR PHOSPHATE 75 MG PO CAPS: 75.0000 mg | ORAL_CAPSULE | Freq: Two times a day (BID) | ORAL | Status: DC

## 2016-03-03 NOTE — Telephone Encounter (Signed)
Pt's son was seen last week and was diagnosed with the flu. Pt is now showing symptoms and wants to know if something can be called in.     [place]

## 2016-03-03 NOTE — Telephone Encounter (Signed)
Patient states that he has a sore throat, runny nose, body aches and a cough. No fever, sob or wheezing presently. Onset 1 day ago. Patient states that he has been taking OTC cold meds with no relief.

## 2016-03-03 NOTE — Telephone Encounter (Signed)
Notified patient Tamiflu 75 mg 1 twice a day for 5 days take with a snack if unable to tolerate the medication then stop the medicine. Warning signs regarding flu should be discussed with the patient regarding if difficulty breathing severe disorientation or getting worse then obviously needs to be seen. Patient verbalized understanding and med was sent to pharmacy.

## 2016-03-03 NOTE — Telephone Encounter (Signed)
Tamiflu 75 mg 1 twice a day for 5 days take with a snack if unable to tolerate the medication then stop the medicine. Warning signs regarding flu should be discussed with the patient regarding if difficulty breathing severe disorientation or getting worse then obviously needs to be seen

## 2016-03-06 ENCOUNTER — Telehealth: Payer: Self-pay | Admitting: Family Medicine

## 2016-03-06 MED ORDER — BENZONATATE 100 MG PO CAPS: 100.0000 mg | ORAL_CAPSULE | Freq: Four times a day (QID) | ORAL | Status: DC | PRN

## 2016-03-06 NOTE — Telephone Encounter (Signed)
Patient 's son was seen with flu last week and now needs something called in for cough to at [place]

## 2016-03-06 NOTE — Telephone Encounter (Signed)
Patient is having headaches, sore throat, body aches and coughing. Patient already had tamiflu prescribed. Needs something for his cough also.

## 2016-03-06 NOTE — Telephone Encounter (Signed)
Wife notified med sent to pharmacy.

## 2016-03-06 NOTE — Telephone Encounter (Signed)
Tessalon Perles 100 mg #30 one every 6 when necessary

## 2016-09-29 ENCOUNTER — Encounter: Payer: Self-pay | Admitting: Family Medicine

## 2016-09-29 ENCOUNTER — Ambulatory Visit (INDEPENDENT_AMBULATORY_CARE_PROVIDER_SITE_OTHER): Payer: 59 | Admitting: Family Medicine

## 2016-09-29 VITALS — BP 120/88 | Ht 71.5 in | Wt 232.0 lb

## 2016-09-29 DIAGNOSIS — Z79899 Other long term (current) drug therapy: Secondary | ICD-10-CM

## 2016-09-29 DIAGNOSIS — Z0000 Encounter for general adult medical examination without abnormal findings: Secondary | ICD-10-CM

## 2016-09-29 DIAGNOSIS — Z13220 Encounter for screening for lipoid disorders: Secondary | ICD-10-CM | POA: Diagnosis not present

## 2016-09-29 DIAGNOSIS — Z125 Encounter for screening for malignant neoplasm of prostate: Secondary | ICD-10-CM | POA: Diagnosis not present

## 2016-09-29 MED ORDER — KETOCONAZOLE 2 % EX CREA: 1.0000 "application " | TOPICAL_CREAM | Freq: Two times a day (BID) | CUTANEOUS | 3 refills | Status: DC | PRN

## 2016-09-29 NOTE — Progress Notes (Signed)
   Subjective:    Patient ID: Mario Meyer, male    DOB: 06/06/1961, 55 y.o.   MRN: [id number]  HPI The patient comes in today for a wellness visit.    A review of their health history was completed.  A review of medications was also completed.  Any needed refills:N/A  Eating habits: not good  Falls/  MVA accidents in past few months: none  Regular exercise: trying too  Specialist pt sees on regular basis:none   Preventative health issues were discussed.   Additional concerns: Rash on thighs   Last colonoscopy- 2016 Review of Systems  Constitutional: Negative for activity change, appetite change and fatigue.  HENT: Negative for congestion.   Respiratory: Negative for cough.   Cardiovascular: Negative for chest pain.  Gastrointestinal: Negative for abdominal pain.  Endocrine: Negative for polydipsia and polyphagia.  Neurological: Negative for weakness.  Psychiatric/Behavioral: Negative for confusion.   This patient does state he does try to exercise a little baby does try to watch his diet to some degree he knows he needs could do better. He also relates that he is motivated try to lose a little bit of weight. Does not smoke does not drink.    Objective:   Physical Exam  Constitutional: He appears well-developed and well-nourished.  HENT:  Head: Normocephalic and atraumatic.  Right Ear: External ear normal.  Left Ear: External ear normal.  Nose: Nose normal.  Mouth/Throat: Oropharynx is clear and moist.  Eyes: EOM are normal. Pupils are equal, round, and reactive to light.  Neck: Normal range of motion. Neck supple. No thyromegaly present.  Cardiovascular: Normal rate, regular rhythm and normal heart sounds.   No murmur heard. Pulmonary/Chest: Effort normal and breath sounds normal. No respiratory distress. He has no wheezes.  Abdominal: Soft. Bowel sounds are normal. He exhibits no distension and no mass. There is no tenderness.  Genitourinary: Penis normal.    Musculoskeletal: Normal range of motion. He exhibits no edema.  Lymphadenopathy:    He has no cervical adenopathy.  Neurological: He is alert. He exhibits normal muscle tone.  Skin: Skin is warm and dry. No erythema.  Psychiatric: He has a normal mood and affect. His behavior is normal. Judgment normal.    Prostate exam normal      Assessment & Plan:  Adult wellness-complete.wellness physical was conducted today. Importance of diet and exercise were discussed in detail. In addition to this a discussion regarding safety was also covered. We also reviewed over immunizations and gave recommendations regarding current immunization needed for age. In addition to this additional areas were also touched on including: Preventative health exams needed: Colonoscopy Up-to-date on colonoscopy Patient will try to work hard on watching how he eats and be more physically active try to lose weight He will do his lab work He also has a rash in the groin region he will try antifungal cream if that doesn't help he will call us we'll set him up with dermatology Follow-up in one year  Patient was advised yearly wellness exam

## 2016-10-01 LAB — LIPID PANEL
CHOLESTEROL TOTAL: 238 mg/dL — AB (ref 100–199)
Chol/HDL Ratio: 3.8 ratio units (ref 0.0–5.0)
HDL: 63 mg/dL (ref >39)
LDL CALC: 147 mg/dL — AB (ref 0–99)
TRIGLYCERIDES: 142 mg/dL (ref 0–149)
VLDL Cholesterol Cal: 28 mg/dL (ref 5–40)

## 2016-10-01 LAB — BASIC METABOLIC PANEL
BUN / CREAT RATIO: 16 (ref 9–20)
BUN: 14 mg/dL (ref 6–24)
CALCIUM: 9.7 mg/dL (ref 8.7–10.2)
CHLORIDE: 102 mmol/L (ref 96–106)
CO2: 25 mmol/L (ref 18–29)
Creatinine, Ser: 0.86 mg/dL (ref 0.76–1.27)
GFR, EST AFRICAN AMERICAN: 114 mL/min/{1.73_m2} (ref >59)
GFR, EST NON AFRICAN AMERICAN: 98 mL/min/{1.73_m2} (ref >59)
Glucose: 87 mg/dL (ref 65–99)
POTASSIUM: 4.8 mmol/L (ref 3.5–5.2)
SODIUM: 141 mmol/L (ref 134–144)

## 2016-10-01 LAB — HEPATIC FUNCTION PANEL
ALBUMIN: 4.4 g/dL (ref 3.5–5.5)
ALT: 15 IU/L (ref 0–44)
AST: 14 IU/L (ref 0–40)
Alkaline Phosphatase: 54 IU/L (ref 39–117)
BILIRUBIN TOTAL: 0.4 mg/dL (ref 0.0–1.2)
Bilirubin, Direct: 0.11 mg/dL (ref 0.00–0.40)
Total Protein: 7 g/dL (ref 6.0–8.5)

## 2016-10-01 LAB — PSA: Prostate Specific Ag, Serum: 2.5 ng/mL (ref 0.0–4.0)

## 2016-10-04 ENCOUNTER — Encounter: Payer: Self-pay | Admitting: Family Medicine

## 2016-10-04 DIAGNOSIS — E785 Hyperlipidemia, unspecified: Secondary | ICD-10-CM | POA: Insufficient documentation

## 2017-07-08 ENCOUNTER — Ambulatory Visit: Payer: 59 | Admitting: Family Medicine

## 2017-09-23 ENCOUNTER — Ambulatory Visit: Payer: Self-pay | Admitting: Family Medicine

## 2017-09-27 ENCOUNTER — Ambulatory Visit: Payer: Self-pay | Admitting: Nurse Practitioner

## 2017-09-28 ENCOUNTER — Ambulatory Visit (INDEPENDENT_AMBULATORY_CARE_PROVIDER_SITE_OTHER): Payer: 59

## 2017-09-28 ENCOUNTER — Ambulatory Visit (INDEPENDENT_AMBULATORY_CARE_PROVIDER_SITE_OTHER): Payer: 59 | Admitting: Orthopaedic Surgery

## 2017-09-28 ENCOUNTER — Encounter: Payer: Self-pay | Admitting: Orthopaedic Surgery

## 2017-09-28 VITALS — BP 132/73 | HR 54 | Temp 97.8°F | Ht 73.0 in | Wt 223.0 lb

## 2017-09-28 DIAGNOSIS — M5442 Lumbago with sciatica, left side: Secondary | ICD-10-CM

## 2017-09-28 MED ORDER — NAPROXEN 500 MG PO TABS: 500.0000 mg | ORAL_TABLET | Freq: Two times a day (BID) | ORAL | 5 refills | Status: DC

## 2017-09-28 MED ORDER — HYDROCODONE-ACETAMINOPHEN 5-325 MG PO TABS: ORAL_TABLET | ORAL | 0 refills | Status: DC

## 2017-09-28 NOTE — Progress Notes (Signed)
Subjective:    Patient ID: Mario Meyer, male    DOB: [birth year], 56 y.o.   MRN: [id number]  HPI He hurt his lower back with pain on the left side and left sided sciatica when doing limb work on a tree that fell from [facility] on 09-20-17.  He has tried ice, heat, rest, rubs and Advil with little help.  He did not fall when he hurt his back.  He has no weakness, no numbness, no bowel or bladder problem.   Review of Systems  HENT: Negative for congestion.   Respiratory: Negative for cough and shortness of breath.   Cardiovascular: Negative for chest pain and leg swelling.  Endocrine: Negative for cold intolerance.  Musculoskeletal: Positive for arthralgias and back pain.  Allergic/Immunologic: Negative for environmental allergies.   Past Medical History:  Diagnosis Date  . Panic attack     Past Surgical History:  Procedure Laterality Date  . COLONOSCOPY N/A 10/04/2015   Procedure: COLONOSCOPY;  Surgeon: Daneil Dolin, MD;  Location: [facility];  Service: Endoscopy;  Laterality: N/A;  7:30 AM  . KNEE ARTHROSCOPY W/ ACL RECONSTRUCTION    . rotator cuff surgery      No current outpatient prescriptions on file prior to visit.   No current facility-administered medications on file prior to visit.     Social History   Social History  . Marital status: Married    Spouse name: N/A  . Number of children: N/A  . Years of education: N/A   Occupational History  . Not on file.   Social History Main Topics  . Smoking status: Never Smoker  . Smokeless tobacco: Never Used  . Alcohol use No  . Drug use: No  . Sexual activity: Not on file   Other Topics Concern  . Not on file   Social History Narrative  . No narrative on file    Family History  Problem Relation Age of Onset  . Cancer Mother     BP 132/73   Pulse (!) 54   Temp 97.8 F (36.6 C)   Ht 6\' 1"  (1.854 m)   Wt 223 lb (101.2 kg)   BMI 29.42 kg/m      Objective:   Physical Exam    Constitutional: He is oriented to person, place, and time. He appears well-developed and well-nourished.  HENT:  Head: Normocephalic and atraumatic.  Eyes: Pupils are equal, round, and reactive to light. Conjunctivae and EOM are normal.  Neck: Normal range of motion. Neck supple.  Cardiovascular: Normal rate, regular rhythm and intact distal pulses.   Pulmonary/Chest: Effort normal.  Abdominal: Soft.  Musculoskeletal: He exhibits tenderness (Lower back is tender on the left, no spasm.  ROM forward 20 with pain, extension 5, lateral bend normal.  Reflexes normal.  Toe/heel gait normal.  SLR negative.).  Neurological: He is alert and oriented to person, place, and time. He has normal reflexes. He displays normal reflexes. No cranial nerve deficit. He exhibits normal muscle tone. Coordination normal.  Skin: Skin is warm and dry.  Psychiatric: He has a normal mood and affect. His behavior is normal. Judgment and thought content normal.  Vitals reviewed.   X-rays were done of the lumbar spine reported separately.      Assessment & Plan:   Encounter Diagnosis  Name Primary?  . Acute left-sided low back pain with left-sided sciatica Yes   Stay out of work next ten days.  Begin Naprosyn and pain medicine.  Precautions discussed.  Exercises given.  Return in three weeks.  I have reviewed the [place] web site prior to prescribing narcotic medicine for this patient.  Call if any problem.  Precautions discussed.   Electronically Signed Sanjuana Kava, MD 10/23/20188:45 AM

## 2017-09-28 NOTE — Patient Instructions (Addendum)
Out of work next ten days. Back Exercises If you have pain in your back, do these exercises 2-3 times each day or as told by your doctor. When the pain goes away, do the exercises once each day, but repeat the steps more times for each exercise (do more repetitions). If you do not have pain in your back, do these exercises once each day or as told by your doctor. Exercises Single Knee to Chest  Do these steps 3-5 times in a row for each leg: 1. Lie on your back on a firm bed or the floor with your legs stretched out. 2. Bring one knee to your chest. 3. Hold your knee to your chest by grabbing your knee or thigh. 4. Pull on your knee until you feel a gentle stretch in your lower back. 5. Keep doing the stretch for 10-30 seconds. 6. Slowly let go of your leg and straighten it.  Pelvic Tilt  Do these steps 5-10 times in a row: 1. Lie on your back on a firm bed or the floor with your legs stretched out. 2. Bend your knees so they point up to the ceiling. Your feet should be flat on the floor. 3. Tighten your lower belly (abdomen) muscles to press your lower back against the floor. This will make your tailbone point up to the ceiling instead of pointing down to your feet or the floor. 4. Stay in this position for 5-10 seconds while you gently tighten your muscles and breathe evenly.  Cat-Cow  Do these steps until your lower back bends more easily: 1. Get on your hands and knees on a firm surface. Keep your hands under your shoulders, and keep your knees under your hips. You may put padding under your knees. 2. Let your head hang down, and make your tailbone point down to the floor so your lower back is round like the back of a cat. 3. Stay in this position for 5 seconds. 4. Slowly lift your head and make your tailbone point up to the ceiling so your back hangs low (sags) like the back of a cow. 5. Stay in this position for 5 seconds.  Press-Ups  Do these steps 5-10 times in a row: 1. Lie  on your belly (face-down) on the floor. 2. Place your hands near your head, about shoulder-width apart. 3. While you keep your back relaxed and keep your hips on the floor, slowly straighten your arms to raise the top half of your body and lift your shoulders. Do not use your back muscles. To make yourself more comfortable, you may change where you place your hands. 4. Stay in this position for 5 seconds. 5. Slowly return to lying flat on the floor.  Bridges  Do these steps 10 times in a row: 1. Lie on your back on a firm surface. 2. Bend your knees so they point up to the ceiling. Your feet should be flat on the floor. 3. Tighten your butt muscles and lift your butt off of the floor until your waist is almost as high as your knees. If you do not feel the muscles working in your butt and the back of your thighs, slide your feet 1-2 inches farther away from your butt. 4. Stay in this position for 3-5 seconds. 5. Slowly lower your butt to the floor, and let your butt muscles relax.  If this exercise is too easy, try doing it with your arms crossed over your chest. Belly Crunches  Do these steps 5-10 times in a row: 1. Lie on your back on a firm bed or the floor with your legs stretched out. 2. Bend your knees so they point up to the ceiling. Your feet should be flat on the floor. 3. Cross your arms over your chest. 4. Tip your chin a little bit toward your chest but do not bend your neck. 5. Tighten your belly muscles and slowly raise your chest just enough to lift your shoulder blades a tiny bit off of the floor. 6. Slowly lower your chest and your head to the floor.  Back Lifts Do these steps 5-10 times in a row: 1. Lie on your belly (face-down) with your arms at your sides, and rest your forehead on the floor. 2. Tighten the muscles in your legs and your butt. 3. Slowly lift your chest off of the floor while you keep your hips on the floor. Keep the back of your head in line with the  curve in your back. Look at the floor while you do this. 4. Stay in this position for 3-5 seconds. 5. Slowly lower your chest and your face to the floor.  Contact a doctor if:  Your back pain gets a lot worse when you do an exercise.  Your back pain does not lessen 2 hours after you exercise. If you have any of these problems, stop doing the exercises. Do not do them again unless your doctor says it is okay. Get help right away if:  You have sudden, very bad back pain. If this happens, stop doing the exercises. Do not do them again unless your doctor says it is okay. This information is not intended to replace advice given to you by your health care provider. Make sure you discuss any questions you have with your health care provider. Document Released: 12/26/2010 Document Revised: 04/30/2016 Document Reviewed: 01/17/2015 Elsevier Interactive Patient Education  [employer].

## 2017-10-04 ENCOUNTER — Ambulatory Visit (INDEPENDENT_AMBULATORY_CARE_PROVIDER_SITE_OTHER): Payer: 59 | Admitting: Family Medicine

## 2017-10-04 ENCOUNTER — Encounter: Payer: Self-pay | Admitting: Family Medicine

## 2017-10-04 VITALS — BP 130/82 | Ht 73.0 in | Wt 221.0 lb

## 2017-10-04 DIAGNOSIS — Z125 Encounter for screening for malignant neoplasm of prostate: Secondary | ICD-10-CM | POA: Diagnosis not present

## 2017-10-04 DIAGNOSIS — Z13220 Encounter for screening for lipoid disorders: Secondary | ICD-10-CM | POA: Diagnosis not present

## 2017-10-04 DIAGNOSIS — Z131 Encounter for screening for diabetes mellitus: Secondary | ICD-10-CM | POA: Diagnosis not present

## 2017-10-04 DIAGNOSIS — Z0000 Encounter for general adult medical examination without abnormal findings: Secondary | ICD-10-CM

## 2017-10-04 MED ORDER — MOMETASONE FUROATE 0.1 % EX CREA: TOPICAL_CREAM | CUTANEOUS | 1 refills | Status: DC

## 2017-10-04 MED ORDER — SILDENAFIL CITRATE 20 MG PO TABS: ORAL_TABLET | ORAL | 0 refills | Status: DC

## 2017-10-04 NOTE — Progress Notes (Signed)
   Subjective:    Patient ID: Mario Meyer, male    DOB: August 15, 1961, 56 y.o.   MRN: [id number]  HPI  The patient comes in today for a wellness visit.    A review of their health history was completed.  A review of medications was also completed.  Any needed refills; none  Eating habits: trying to eat healthy  Falls/  MVA accidents in past few months: none  Regular exercise: yes- walking  Specialist pt sees on regular basis: saw back dr but doing better  Preventative health issues were discussed.   Additional concerns: rash on leg   Review of Systems  Constitutional: Negative for activity change, appetite change and fever.  HENT: Negative for congestion and rhinorrhea.   Eyes: Negative for discharge.  Respiratory: Negative for cough and wheezing.   Cardiovascular: Negative for chest pain.  Gastrointestinal: Negative for abdominal pain, blood in stool and vomiting.  Genitourinary: Negative for difficulty urinating and frequency.  Musculoskeletal: Negative for neck pain.  Skin: Negative for rash.  Allergic/Immunologic: Negative for environmental allergies and food allergies.  Neurological: Negative for weakness and headaches.  Psychiatric/Behavioral: Negative for agitation.       Objective:   Physical Exam  Constitutional: He appears well-developed and well-nourished.  HENT:  Head: Normocephalic and atraumatic.  Right Ear: External ear normal.  Left Ear: External ear normal.  Nose: Nose normal.  Mouth/Throat: Oropharynx is clear and moist.  Eyes: Pupils are equal, round, and reactive to light. EOM are normal.  Neck: Normal range of motion. Neck supple. No thyromegaly present.  Cardiovascular: Normal rate, regular rhythm and normal heart sounds.   No murmur heard. Pulmonary/Chest: Effort normal and breath sounds normal. No respiratory distress. He has no wheezes.  Abdominal: Soft. Bowel sounds are normal. He exhibits no distension and no mass. There is no  tenderness.  Genitourinary: Penis normal.  Musculoskeletal: Normal range of motion. He exhibits no edema.  Lymphadenopathy:    He has no cervical adenopathy.  Neurological: He is alert. He exhibits normal muscle tone.  Skin: Skin is warm and dry. No erythema.  Psychiatric: He has a normal mood and affect. His behavior is normal. Judgment normal.     Prostate exam normal     Assessment & Plan:  Adult wellness-complete.wellness physical was conducted today. Importance of diet and exercise were discussed in detail. In addition to this a discussion regarding safety was also covered. We also reviewed over immunizations and gave recommendations regarding current immunization needed for age. In addition to this additional areas were also touched on including: Preventative health exams needed: Colonoscopy up-to-date next 12/2019  Patient was advised yearly wellness exam  Erectile dysfunction-sildenafil as needed side effects discussed patient denies any chest pain shortness of breath  Screening lab work  Atopic dermatitis left lower leg Elocon cream twice daily as needed if ongoing troubles or worse follow-up  Recheck if any problems

## 2017-10-05 LAB — LIPID PANEL
CHOL/HDL RATIO: 3.4 ratio (ref 0.0–5.0)
CHOLESTEROL TOTAL: 213 mg/dL — AB (ref 100–199)
HDL: 62 mg/dL (ref >39)
LDL CALC: 131 mg/dL — AB (ref 0–99)
TRIGLYCERIDES: 102 mg/dL (ref 0–149)
VLDL Cholesterol Cal: 20 mg/dL (ref 5–40)

## 2017-10-05 LAB — BASIC METABOLIC PANEL
BUN/Creatinine Ratio: 15 (ref 9–20)
BUN: 15 mg/dL (ref 6–24)
CALCIUM: 9.7 mg/dL (ref 8.7–10.2)
CHLORIDE: 104 mmol/L (ref 96–106)
CO2: 23 mmol/L (ref 20–29)
Creatinine, Ser: 1.03 mg/dL (ref 0.76–1.27)
GFR, EST AFRICAN AMERICAN: 94 mL/min/{1.73_m2} (ref >59)
GFR, EST NON AFRICAN AMERICAN: 81 mL/min/{1.73_m2} (ref >59)
Glucose: 86 mg/dL (ref 65–99)
POTASSIUM: 4.8 mmol/L (ref 3.5–5.2)
SODIUM: 141 mmol/L (ref 134–144)

## 2017-10-05 LAB — HEPATIC FUNCTION PANEL
ALT: 22 IU/L (ref 0–44)
AST: 15 IU/L (ref 0–40)
Albumin: 4.6 g/dL (ref 3.5–5.5)
Alkaline Phosphatase: 61 IU/L (ref 39–117)
BILIRUBIN TOTAL: 0.6 mg/dL (ref 0.0–1.2)
BILIRUBIN, DIRECT: 0.13 mg/dL (ref 0.00–0.40)
Total Protein: 7.4 g/dL (ref 6.0–8.5)

## 2017-10-05 LAB — PSA: PROSTATE SPECIFIC AG, SERUM: 3.7 ng/mL (ref 0.0–4.0)

## 2017-10-08 ENCOUNTER — Encounter: Payer: Self-pay | Admitting: Family Medicine

## 2017-10-19 ENCOUNTER — Ambulatory Visit: Payer: 59 | Admitting: Orthopaedic Surgery

## 2018-01-20 ENCOUNTER — Other Ambulatory Visit: Payer: Self-pay | Admitting: Radiology

## 2018-01-20 NOTE — Telephone Encounter (Signed)
error 

## 2018-03-01 ENCOUNTER — Encounter: Payer: Self-pay | Admitting: Family Medicine

## 2018-03-01 ENCOUNTER — Ambulatory Visit: Payer: 59 | Admitting: Family Medicine

## 2018-03-01 VITALS — BP 118/70 | Temp 97.8°F | Ht 73.0 in | Wt 229.0 lb

## 2018-03-01 DIAGNOSIS — J111 Influenza due to unidentified influenza virus with other respiratory manifestations: Secondary | ICD-10-CM

## 2018-03-01 MED ORDER — OSELTAMIVIR PHOSPHATE 75 MG PO CAPS: 75.0000 mg | ORAL_CAPSULE | Freq: Two times a day (BID) | ORAL | 0 refills | Status: AC

## 2018-03-01 NOTE — Progress Notes (Signed)
   Subjective:    Patient ID: Mario Meyer, male    DOB: Jul 16, 1961, 57 y.o.   MRN: [id number]  Sinusitis  This is a new problem. Episode onset: 2 days. Associated symptoms include congestion, coughing, ear pain, headaches and a sore throat.    Sore throa t  Felt sick   Headache prety ba  Feels vey weak   Bad cough   Runny nose bad     Review of Systems  HENT: Positive for congestion, ear pain and sore throat.   Respiratory: Positive for cough.   Neurological: Positive for headaches.       Objective:   Physical Exam  Alert vitals reviewed, moderate malaise. Hydration good. Positive nasal congestion lungs no crackles or wheezes, no tachypnea, intermittent bronchial cough during exam heart regular rate and rhythm.       Assessment & Plan:  Impression influenza discussed at length. Petra Kuba of illness and potential sequela discussed. Plan Tamiflu prescribed if indicated and timing appropriate. Symptom care discussed. Warning signs discussed. WSL

## 2018-07-21 ENCOUNTER — Other Ambulatory Visit (HOSPITAL_COMMUNITY)
Admission: RE | Admit: 2018-07-21 | Discharge: 2018-07-21 | Disposition: A | Discharge status: Home / Self Care | Payer: 59 | Source: Ambulatory Visit | Attending: Family Medicine | Admitting: Family Medicine

## 2018-07-21 ENCOUNTER — Ambulatory Visit (HOSPITAL_COMMUNITY)
Admission: RE | Admit: 2018-07-21 | Discharge: 2018-07-21 | Disposition: A | Discharge status: Home / Self Care | Payer: 59 | Source: Ambulatory Visit | Attending: Family Medicine | Admitting: Family Medicine

## 2018-07-21 ENCOUNTER — Ambulatory Visit: Payer: 59 | Admitting: Family Medicine

## 2018-07-21 ENCOUNTER — Other Ambulatory Visit: Payer: Self-pay

## 2018-07-21 ENCOUNTER — Encounter: Payer: Self-pay | Admitting: Family Medicine

## 2018-07-21 VITALS — BP 122/84 | Temp 97.8°F | Ht 73.0 in | Wt 223.4 lb

## 2018-07-21 DIAGNOSIS — R0609 Other forms of dyspnea: Secondary | ICD-10-CM

## 2018-07-21 DIAGNOSIS — R0789 Other chest pain: Secondary | ICD-10-CM

## 2018-07-21 DIAGNOSIS — R0600 Dyspnea, unspecified: Secondary | ICD-10-CM

## 2018-07-21 LAB — CBC WITH DIFFERENTIAL/PLATELET
Basophils Absolute: 0 10*3/uL (ref 0.0–0.1)
Basophils Relative: 0 %
EOS PCT: 4 %
Eosinophils Absolute: 0.3 10*3/uL (ref 0.0–0.7)
HEMATOCRIT: 47.3 % (ref 39.0–52.0)
HEMOGLOBIN: 15.6 g/dL (ref 13.0–17.0)
LYMPHS ABS: 2.1 10*3/uL (ref 0.7–4.0)
Lymphocytes Relative: 28 %
MCH: 28.2 pg (ref 26.0–34.0)
MCHC: 33 g/dL (ref 30.0–36.0)
MCV: 85.4 fL (ref 78.0–100.0)
Monocytes Absolute: 1 10*3/uL (ref 0.1–1.0)
Monocytes Relative: 13 %
NEUTROS ABS: 4.1 10*3/uL (ref 1.7–7.7)
Neutrophils Relative %: 55 %
PLATELETS: 241 10*3/uL (ref 150–400)
RBC: 5.54 MIL/uL (ref 4.22–5.81)
RDW: 12.6 % (ref 11.5–15.5)
WBC: 7.4 10*3/uL (ref 4.0–10.5)

## 2018-07-21 LAB — TROPONIN I: Troponin I: <0.03 ng/mL (ref <0.03)

## 2018-07-21 LAB — BASIC METABOLIC PANEL
Anion gap: 6 (ref 5–15)
BUN: 15 mg/dL (ref 6–20)
CHLORIDE: 108 mmol/L (ref 98–111)
CO2: 26 mmol/L (ref 22–32)
CREATININE: 0.65 mg/dL (ref 0.61–1.24)
Calcium: 9.3 mg/dL (ref 8.9–10.3)
GFR calc non Af Amer: >60 mL/min (ref >60)
Glucose, Bld: 108 mg/dL — ABNORMAL HIGH (ref 70–99)
POTASSIUM: 4 mmol/L (ref 3.5–5.1)
Sodium: 140 mmol/L (ref 135–145)

## 2018-07-21 LAB — D-DIMER, QUANTITATIVE: D-Dimer, Quant: 0.47 ug/mL-FEU (ref 0.00–0.50)

## 2018-07-21 NOTE — Progress Notes (Signed)
   Subjective:    Patient ID: Mario Meyer, male    DOB: May 02, 1961, 57 y.o.   MRN: [id number]  HPI Pt here today due to right side of chest hurting and having some SOB. Started about a week ago. Pt states he was at the beach and in the pool and told his kids that he was SOB and his chest was hurting. Pt states he is also feeling more tired than normal.   Pt states he has been under more stress at work. Pt states a lot more pressure going on at work. Does maintenance work for [employer] This patient states he is usually able to walk a long distance without trouble He also states he usually is able to get in a pool of water and swim around without trouble He related that recently over the past 2 weeks he is having shortness of breath with activity unable to do walk a long distance to going to work unable to do swimming gets very short of breath he is also had some intermittent chest hurting on the right side over the past couple weeks he describes it as a ache and a sharp pain lasts anywhere from a few seconds to a few minutes denies any other particular troubles with it does not radiate down the arm or into his neck Patient does have risk factors of age gender and elevated LDL Patient does not smoke No recent respiratory symptoms No asthma or COPD Review of Systems  Constitutional: Negative for diaphoresis and fatigue.  HENT: Negative for congestion and rhinorrhea.   Respiratory: Positive for shortness of breath. Negative for cough.   Cardiovascular: Positive for chest pain. Negative for leg swelling.  Gastrointestinal: Negative for abdominal pain and diarrhea.  Skin: Negative for color change and rash.  Neurological: Negative for dizziness and headaches.  Psychiatric/Behavioral: Negative for behavioral problems and confusion.       Objective:   Physical Exam  Constitutional: He appears well-nourished. No distress.  HENT:  Head: Normocephalic and atraumatic.  Eyes: Right eye  exhibits no discharge. Left eye exhibits no discharge.  Neck: No tracheal deviation present.  Cardiovascular: Normal rate, regular rhythm and normal heart sounds.  No murmur heard. Pulmonary/Chest: Effort normal and breath sounds normal. No respiratory distress.  Musculoskeletal: He exhibits no edema.  Lymphadenopathy:    He has no cervical adenopathy.  Neurological: He is alert. Coordination normal.  Skin: Skin is warm and dry.  Psychiatric: He has a normal mood and affect. His behavior is normal.  Vitals reviewed.  EKG does not show any acute changes-it was compared against most previous EKG in the electronic record Stat lab work including troponin d-dimer negative Chest x-ray negative I did discuss the case with cardiology they will see the patient this coming Monday The patient is aware of all of this    Assessment & Plan:  This patient is having potentially anginal equivalent symptoms Dyspnea on exertion could be cardiology related potentially a blockage that is developing Lab work points against pulmonary embolus points against recent heart attack I did advise the patient to use 81 mg aspirin until he is seen on Monday Also advised the patient if he should start having increased discomfort of significant severity to go to the ER or call 911 Avoid any strenuous work the next few days If cardiology work-up is negative then may need to pursue pulmonary work-up including pulmonary function testing as well as possible CT angios

## 2018-07-22 ENCOUNTER — Encounter: Payer: Self-pay | Admitting: Family Medicine

## 2018-07-25 ENCOUNTER — Encounter: Payer: Self-pay | Admitting: Cardiovascular Disease

## 2018-07-25 ENCOUNTER — Ambulatory Visit: Payer: 59 | Admitting: Cardiovascular Disease

## 2018-07-25 VITALS — BP 126/72 | HR 95 | Ht 73.0 in | Wt 224.0 lb

## 2018-07-25 DIAGNOSIS — R0609 Other forms of dyspnea: Secondary | ICD-10-CM | POA: Diagnosis not present

## 2018-07-25 DIAGNOSIS — R5383 Other fatigue: Secondary | ICD-10-CM

## 2018-07-25 DIAGNOSIS — R0789 Other chest pain: Secondary | ICD-10-CM

## 2018-07-25 NOTE — Patient Instructions (Signed)
Your physician recommends that you schedule a follow-up appointment in: with dr.[email or web address] , next available after test     Your physician has requested that you have en exercise stress myoview. For further information please visit [email or web address]. Please follow instruction sheet, as given.     No medication changes    No lab work today     Thank you for choosing [facility] !

## 2018-07-25 NOTE — Progress Notes (Signed)
CARDIOLOGY CONSULT NOTE  Patient ID: Mario Meyer MRN: [id number] DOB/AGE: [birth year] 56 y.o.  Admit date: (Not on file) Primary Physician: Kathyrn Drown, MD Referring Physician: Kathyrn Drown, MD  Reason for Consultation: Chest pain  HPI: Mario Meyer is a 57 y.o. male who is being seen today for the evaluation of chest pain at the request of Luking, Elayne Snare, MD.   I was called by his PCP during the latter part of last week as this patient has been experiencing chest pain and shortness of breath.  Symptoms began about a week and a half ago.  Recent labs dated 07/21/2018 include the following: Normal troponin, normal basic metabolic panel, normal d-dimer, and normal CBC.  I reviewed lipids dated 10/04/2017: Total cholesterol 213, triglycerides 102, HDL 62, LDL 131.  Chest x-ray showed no active cardiopulmonary disease on 07/21/2018.  I personally reviewed the ECG performed on 07/21/2018 which showed normal sinus rhythm with no ST segment or T wave abnormalities, nor any arrhythmias.  He told me he was at the beach 2 weekends ago and was swimming in the pool with his son and he became prematurely fatigued.  This had not happened before.  He has put on about 20 to 25 pounds over the past 2 to 3 years.  He has noticed increased abdominal fat and shortness of breath when bending over to tie his shoes.  He developed right sided chest pain which has since resolved and he said it subsided yesterday.  He wonders if he pulled a muscle.  He said he feels "slightly off ".  He walks about 500 to 600 yards from the parking lot to work and has some exertional dyspnea with this.  This is been going on for the past few weeks.  He has never smoked.  He drinks mostly water.  He denies a history of allergies and asthma.  [place]  His father lives on the farm as well and is very active.  His mother died of colon cancer.  He generally eats sausage, egg, and she is for breakfast  but today only ate a honey bun.  He normally eats foods high in saturated fats for lunch but is now trying to eat more salads as he wants to lose weight.   No Known Allergies  No current outpatient medications on file.   No current facility-administered medications for this visit.     Past Medical History:  Diagnosis Date  . Panic attack     Past Surgical History:  Procedure Laterality Date  . COLONOSCOPY N/A 10/04/2015   Procedure: COLONOSCOPY;  Surgeon: Daneil Dolin, MD;  Location: [facility];  Service: Endoscopy;  Laterality: N/A;  7:30 AM  . KNEE ARTHROSCOPY W/ ACL RECONSTRUCTION    . rotator cuff surgery      Social History   Socioeconomic History  . Marital status: Married    Spouse name: Not on file  . Number of children: Not on file  . Years of education: Not on file  . Highest education level: Not on file  Occupational History  . Not on file  Social Needs  . Financial resource strain: Not on file  . Food insecurity:    Worry: Not on file    Inability: Not on file  . Transportation needs:    Medical: Not on file    Non-medical: Not on file  Tobacco Use  . Smoking status: Never  Smoker  . Smokeless tobacco: Never Used  Substance and Sexual Activity  . Alcohol use: No  . Drug use: No  . Sexual activity: Not on file  Lifestyle  . Physical activity:    Days per week: Not on file    Minutes per session: Not on file  . Stress: Not on file  Relationships  . Social connections:    Talks on phone: Not on file    Gets together: Not on file    Attends religious service: Not on file    Active member of club or organization: Not on file    Attends meetings of clubs or organizations: Not on file    Relationship status: Not on file  . Intimate partner violence:    Fear of current or ex partner: Not on file    Emotionally abused: Not on file    Physically abused: Not on file    Forced sexual activity: Not on file  Other Topics Concern  . Not on file    Social History Narrative  . Not on file     No family history of premature CAD in 1st degree relatives.  Current Meds  Medication Sig  . [DISCONTINUED] mometasone (ELOCON) 0.1 % cream Apply to affected area 2 times daily until better than prn      Review of systems complete and found to be negative unless listed above in HPI    Physical exam Blood pressure 126/72, pulse 95, height 6\' 1"  (1.854 m), weight 224 lb (101.6 kg), SpO2 96 %. General: NAD Neck: No JVD, no thyromegaly or thyroid nodule.  Lungs: Clear to auscultation bilaterally with normal respiratory effort. CV: Nondisplaced PMI. Regular rate and rhythm, normal S1/S2, no S3/S4, no murmur.  No peripheral edema.  No carotid bruit.   Abdomen: Soft, nontender, no distention.  Skin: Intact without lesions or rashes.  Neurologic: Alert and oriented x 3.  Psych: Normal affect. Extremities: No clubbing or cyanosis.  HEENT: Normal.   ECG: Most recent ECG reviewed.   Labs: Lab Results  Component Value Date/Time   K 4.0 07/21/2018 11:18 AM   BUN 15 07/21/2018 11:18 AM   BUN 15 10/04/2017 03:32 PM   CREATININE 0.65 07/21/2018 11:18 AM   CREATININE 0.87 12/22/2014 10:17 AM   ALT 22 10/04/2017 03:32 PM   HGB 15.6 07/21/2018 11:18 AM     Lipids: Lab Results  Component Value Date/Time   LDLCALC 131 (H) 10/04/2017 03:32 PM   CHOL 213 (H) 10/04/2017 03:32 PM   TRIG 102 10/04/2017 03:32 PM   HDL 62 10/04/2017 03:32 PM        ASSESSMENT AND PLAN:  1.  Exertional dyspnea and fatigue: He has normally been very active with a recent decline in exercise tolerance.  He has no obvious cardiac risk factors other than some mildly elevated lipid levels as noted above.  I will arrange for an exercise Myoview stress test.  2.  Right-sided chest pain: This resolved yesterday.  He had not been experiencing symptoms like this before.  See discussion #1.    Disposition: Follow up in next few months or earlier if need  be.  Signed: Kate Sable, M.D., F.A.C.C.  07/25/2018, 3:37 PM

## 2018-07-29 ENCOUNTER — Encounter (HOSPITAL_COMMUNITY): Payer: Self-pay

## 2018-07-29 ENCOUNTER — Encounter (HOSPITAL_COMMUNITY)
Admission: RE | Admit: 2018-07-29 | Discharge: 2018-07-29 | Disposition: A | Discharge status: Home / Self Care | Payer: 59 | Source: Ambulatory Visit | Attending: Cardiovascular Disease | Admitting: Cardiovascular Disease

## 2018-07-29 ENCOUNTER — Encounter (HOSPITAL_BASED_OUTPATIENT_CLINIC_OR_DEPARTMENT_OTHER)
Admission: RE | Admit: 2018-07-29 | Discharge: 2018-07-29 | Disposition: A | Discharge status: Home / Self Care | Payer: 59 | Source: Ambulatory Visit | Attending: Cardiovascular Disease | Admitting: Cardiovascular Disease

## 2018-07-29 DIAGNOSIS — R0609 Other forms of dyspnea: Secondary | ICD-10-CM | POA: Diagnosis not present

## 2018-07-29 LAB — NM MYOCAR MULTI W/SPECT W/WALL MOTION / EF
CHL CUP MPHR: 164 {beats}/min
CHL CUP NUCLEAR SDS: 0
CSEPHR: 87 %
CSEPPHR: 144 {beats}/min
Estimated workload: 7 METS
Exercise duration (min): 7 min
Exercise duration (sec): 10 s
LHR: 0.44
LVDIAVOL: 115 mL (ref 62–150)
LVSYSVOL: 40 mL
NUC STRESS TID: 1.01
RPE: 13
Rest HR: 65 {beats}/min
SRS: 2
SSS: 2

## 2018-07-29 MED ORDER — TECHNETIUM TC 99M TETROFOSMIN IV KIT
10.0000 | PACK | Freq: Once | INTRAVENOUS | Status: AC | PRN
  Administered 2018-07-29: 11 via INTRAVENOUS

## 2018-07-29 MED ORDER — TECHNETIUM TC 99M TETROFOSMIN IV KIT
30.0000 | PACK | Freq: Once | INTRAVENOUS | Status: AC | PRN
  Administered 2018-07-29: 30 via INTRAVENOUS

## 2018-07-29 MED ORDER — SODIUM CHLORIDE 0.9% FLUSH
INTRAVENOUS | Status: AC
  Administered 2018-07-29: 10 mL via INTRAVENOUS
  Filled 2018-07-29: qty 160

## 2018-07-29 MED ORDER — SODIUM CHLORIDE 0.9% FLUSH
INTRAVENOUS | Status: AC
  Filled 2018-07-29: qty 10

## 2018-07-29 MED ORDER — REGADENOSON 0.4 MG/5ML IV SOLN
INTRAVENOUS | Status: AC
  Filled 2018-07-29: qty 5

## 2018-08-01 ENCOUNTER — Telehealth: Payer: Self-pay

## 2018-08-01 MED ORDER — ATORVASTATIN CALCIUM 40 MG PO TABS: 40.0000 mg | ORAL_TABLET | Freq: Every day | ORAL | 3 refills | Status: DC

## 2018-08-01 MED ORDER — ASPIRIN EC 81 MG PO TBEC: 81.0000 mg | DELAYED_RELEASE_TABLET | Freq: Every day | ORAL | 3 refills | Status: DC

## 2018-08-01 MED ORDER — METOPROLOL TARTRATE 25 MG PO TABS: 25.0000 mg | ORAL_TABLET | Freq: Two times a day (BID) | ORAL | 3 refills | Status: DC

## 2018-08-01 NOTE — Telephone Encounter (Signed)
Pt will start on meds and call us back in 2 weeks, has f/u already with B.Strader PA-C 9/23 at 3:30 pm

## 2018-08-01 NOTE — Telephone Encounter (Signed)
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 I will manage medically. If symptoms don't improve with optimal medical therapy, may consider cath. Have him start ASA 81 mg, metoprolol 25 mg bid, and Lipitor 40 mg. Have him call us back after having been on these meds for 2 weeks to assess for symptomatic improvement. Have him see me at 3:40 PM on 9/27.

## 2018-08-01 NOTE — Progress Notes (Signed)
The most recent lipid profile is present in epic-October 2018

## 2018-08-26 ENCOUNTER — Ambulatory Visit: Payer: 59 | Admitting: Student

## 2019-02-16 ENCOUNTER — Ambulatory Visit (HOSPITAL_COMMUNITY)
Admission: RE | Admit: 2019-02-16 | Discharge: 2019-02-16 | Disposition: A | Discharge status: Home / Self Care | Payer: 59 | Source: Ambulatory Visit | Attending: Family Medicine | Admitting: Family Medicine

## 2019-02-16 ENCOUNTER — Encounter: Payer: Self-pay | Admitting: Family Medicine

## 2019-02-16 ENCOUNTER — Other Ambulatory Visit: Payer: Self-pay

## 2019-02-16 ENCOUNTER — Ambulatory Visit: Payer: 59 | Admitting: Family Medicine

## 2019-02-16 VITALS — BP 124/76 | Temp 98.4°F | Ht 73.0 in | Wt 215.0 lb

## 2019-02-16 DIAGNOSIS — R0609 Other forms of dyspnea: Secondary | ICD-10-CM

## 2019-02-16 DIAGNOSIS — J019 Acute sinusitis, unspecified: Secondary | ICD-10-CM

## 2019-02-16 DIAGNOSIS — J4521 Mild intermittent asthma with (acute) exacerbation: Secondary | ICD-10-CM | POA: Insufficient documentation

## 2019-02-16 DIAGNOSIS — R079 Chest pain, unspecified: Secondary | ICD-10-CM | POA: Insufficient documentation

## 2019-02-16 MED ORDER — PREDNISONE 20 MG PO TABS: ORAL_TABLET | ORAL | 0 refills | Status: DC

## 2019-02-16 MED ORDER — AZITHROMYCIN 250 MG PO TABS: ORAL_TABLET | ORAL | 0 refills | Status: DC

## 2019-02-16 MED ORDER — BUDESONIDE-FORMOTEROL FUMARATE 80-4.5 MCG/ACT IN AERO: 2.0000 | INHALATION_SPRAY | Freq: Two times a day (BID) | RESPIRATORY_TRACT | 3 refills | Status: DC

## 2019-02-16 NOTE — Progress Notes (Signed)
   Subjective:    Patient ID: Mario Meyer, male    DOB: [birth year], 58 y.o.   MRN: [id number]  Cough  This is a new problem. Episode onset: 4 - 6 weeks. Associated symptoms include wheezing. Treatments tried: otc meds,     chest pain and sob when walking. Has been going on since august.  When questioned more closely he relates it is more shortness of breath no true angina.  He just feels like he cannot get a good deep breath.  The patient had a thorough work-up by cardiology last year including a Myoview which was low risk.  Concerned about weight loss. Last visit in august weight was 223 lb. Pt states he is not doing anything to lose weight and appetitie the same. States he eats a lot.  The patient has dropped a few pounds but not a large amount.  Review of Systems  Respiratory: Positive for cough and wheezing.   He does relate a fair amount of coughing and congestion intermittent wheezing and sometimes difficulty getting a good deep breath denies any chest tightness pressure pain denies pain in the legs sweats chills fever sweats at night     Objective:   Physical Exam Eardrums normal throat is normal neck no masses no adenopathy lungs are clear no crackles tight cough noted multiple times throughout the exam heart regular without murmurs   Patient coughing up some discolored phlegm    Assessment & Plan:  Probable reactive airway related to a upper respiratory illness that got into his bronchioles several weeks back It is possible the patient may have mild reactive airway and is also possible patient is developing either asthma or COPD he does not smoke We will do pulmonary function test along with chest x-ray We will also do Symbicort twice daily and in addition to this albuterol as needed plus also prednisone taper and Zithromax  If he is dramatically better we may not have to do the pulmonary function test EKG looks good no acute changes As for cardiac issues I do not feel the  patient has cardiac issues I would not recommend angiogram at this point but if his pulmonary function evaluation is negative and he continues to have troubles with shortness of breath we will reinitiate cardiology

## 2019-02-16 NOTE — Patient Instructions (Signed)
How to Use a Metered Dose Inhaler  A metered dose inhaler is a handheld device for taking medicine that must be breathed into the lungs (inhaled). The device can be used to deliver a variety of inhaled medicines, including:   Quick relief or rescue medicines, such as bronchodilators.   Controller medicines, such as corticosteroids.  The medicine is delivered by pushing down on a metal canister to release a preset amount of spray and medicine. Each device contains the amount of medicine that is needed for a preset number of uses (inhalations).  Your health care provider may recommend that you use a spacer with your inhaler to help you take the medicine more effectively. A spacer is a plastic tube with a mouthpiece on one end and an opening that connects to the inhaler on the other end. A spacer holds the medicine in a tube for a short time, which allows you to inhale more medicine.  What are the risks?  If you do not use your inhaler correctly, medicine might not reach your lungs to help you breathe.  Inhaler medicine can cause side effects, such as:   Mouth or throat infection.   Cough.   Hoarseness.   Headache.   Nausea and vomiting.   Lung infection (pneumonia) in people who have a lung condition called COPD.  How to use a metered dose inhaler without a spacer    1. Remove the cap from the inhaler.  2. If you are using the inhaler for the first time, shake it for 5 seconds, turn it away from your face, then release 4 puffs into the air. This is called priming.  3. Shake the inhaler for 5 seconds.  4. Position the inhaler so the top of the canister faces up.  5. Put your index finger on the top of the medicine canister. Support the bottom of the inhaler with your thumb.  6. Breathe out normally and as completely as possible, away from the inhaler.  7. Either place the inhaler between your teeth and close your lips tightly around the mouthpiece, or hold the inhaler 1-2 inches (2.5-5 cm) away from your open  mouth. Keep your tongue down out of the way. If you are unsure which technique to use, ask your health care provider.  8. Press the canister down with your index finger to release the medicine, then inhale deeply and slowly through your mouth (not your nose) until your lungs are completely filled. Inhaling should take 4-6 seconds.  9. Hold the medicine in your lungs for 5-10 seconds (10 seconds is best). This helps the medicine get into the small airways of your lungs.  10. With your lips in a tight circle (pursed), breathe out slowly.  11. Repeat steps 3-10 until you have taken the number of puffs that your health care provider directed. Wait about 1 minute between puffs or as directed.  12. Put the cap on the inhaler.  13. If you are using a steroid inhaler, rinse your mouth with water, gargle, and spit out the water. Do not swallow the water.  How to use a metered dose inhaler with a spacer    1. Remove the cap from the inhaler.  2. If you are using the inhaler for the first time, shake it for 5 seconds, turn it away from your face, then release 4 puffs into the air. This is called priming.  3. Shake the inhaler for 5 seconds.  4. Place the open end of the   spacer onto the inhaler mouthpiece.  5. Position the inhaler so the top of the canister faces up and the spacer mouthpiece faces you.  6. Put your index finger on the top of the medicine canister. Support the bottom of the inhaler and the spacer with your thumb.  7. Breathe out normally and as completely as possible, away from the spacer.  8. Place the spacer between your teeth and close your lips tightly around it. Keep your tongue down out of the way.  9. Press the canister down with your index finger to release the medicine, then inhale deeply and slowly through your mouth (not your nose) until your lungs are completely filled. Inhaling should take 4-6 seconds.  10. Hold the medicine in your lungs for 5-10 seconds (10 seconds is best). This helps the  medicine get into the small airways of your lungs.  11. With your lips in a tight circle (pursed), breathe out slowly.  12. Repeat steps 3-11 until you have taken the number of puffs that your health care provider directed. Wait about 1 minute between puffs or as directed.  13. Remove the spacer from the inhaler and put the cap on the inhaler.  14. If you are using a steroid inhaler, rinse your mouth with water, gargle, and spit out the water. Do not swallow the water.  Follow these instructions at home:   Take your inhaled medicine only as told by your health care provider. Do not use the inhaler more than directed by your health care provider.   Keep all follow-up visits as told by your health care provider. This is important.   If your inhaler has a counter, you can check it to determine how full your inhaler is. If your inhaler does not have a counter, ask your health care provider when you will need to refill your inhaler and write the refill date on a calendar or on your inhaler canister. Note that you cannot know when an inhaler is empty by shaking it.   Follow directions on the package insert for care and cleaning of your inhaler and spacer.  Contact a health care provider if:   Symptoms are only partially relieved with your inhaler.   You are having trouble using your inhaler.   You have an increase in phlegm.   You have headaches.  Get help right away if:   You feel little or no relief after using your inhaler.   You have dizziness.   You have a fast heart rate.   You have chills or a fever.   You have night sweats.   There is blood in your phlegm.  Summary   A metered dose inhaler is a handheld device for taking medicine that must be breathed into the lungs (inhaled).   The medicine is delivered by pushing down on a metal canister to release a preset amount of spray and medicine.   Each device contains the amount of medicine that is needed for a preset number of uses (inhalations).  This  information is not intended to replace advice given to you by your health care provider. Make sure you discuss any questions you have with your health care provider.  Document Released: 11/23/2005 Document Revised: 06/14/2017 Document Reviewed: 10/13/2016  Elsevier Interactive Patient Education  2019 [employer]

## 2019-02-17 ENCOUNTER — Telehealth: Payer: Self-pay | Admitting: Family Medicine

## 2019-02-17 NOTE — Telephone Encounter (Signed)
Please let the patient know that I did communicate with cardiology Their office will be connecting with them We are concerned that this shortness of breath could be related to coronary artery disease Still go ahead with the inhaler that we prescribed No we will still recommend that the patient follow through with pulmonary function test Cardiology office will be connecting with patient to set up appointment

## 2019-02-17 NOTE — Telephone Encounter (Signed)
Left message to return call 

## 2019-02-20 ENCOUNTER — Other Ambulatory Visit: Payer: Self-pay

## 2019-02-20 ENCOUNTER — Ambulatory Visit (INDEPENDENT_AMBULATORY_CARE_PROVIDER_SITE_OTHER): Payer: 59 | Admitting: Cardiovascular Disease

## 2019-02-20 ENCOUNTER — Encounter: Payer: Self-pay | Admitting: Cardiovascular Disease

## 2019-02-20 ENCOUNTER — Ambulatory Visit: Payer: 59 | Admitting: Physician Assistant

## 2019-02-20 ENCOUNTER — Other Ambulatory Visit: Payer: Self-pay | Admitting: Cardiovascular Disease

## 2019-02-20 VITALS — BP 116/74 | HR 79 | Ht 73.0 in | Wt 218.0 lb

## 2019-02-20 DIAGNOSIS — Z01818 Encounter for other preprocedural examination: Secondary | ICD-10-CM

## 2019-02-20 DIAGNOSIS — R9439 Abnormal result of other cardiovascular function study: Secondary | ICD-10-CM

## 2019-02-20 DIAGNOSIS — R0609 Other forms of dyspnea: Secondary | ICD-10-CM | POA: Diagnosis not present

## 2019-02-20 DIAGNOSIS — I209 Angina pectoris, unspecified: Secondary | ICD-10-CM

## 2019-02-20 DIAGNOSIS — R079 Chest pain, unspecified: Secondary | ICD-10-CM

## 2019-02-20 NOTE — H&P (View-Only) (Signed)
SUBJECTIVE: The patient presents for follow-up of exertional dyspnea and fatigue in the context of presumed coronary artery disease.  Nuclear stress test on 07/29/2018 showed prior myocardial infarction with peri-infarct ischemia involving the inferior wall.  It was deemed a low risk study.  Left ventricular systolic function was normal.  He has been experiencing progressive exertional dyspnea.  Chest x-ray on 02/16/2019 was normal.  He saw his PCP on 02/16/2019.  Dr. Wolfgang Phoenix reached out to me regarding the patient's shortness of breath as he was concerned this could be related to coronary artery disease.  He continues to experience exertional dyspnea with exertional right-sided chest pain which subsides with rest.  He did not notice any difference after taking metoprolol.  His PCP ordered pulmonary function testing but he has yet to pursue this.  He is a non-smoker.   Review of Systems: As per "subjective", otherwise negative.  No Known Allergies  Current Outpatient Medications  Medication Sig Dispense Refill  . aspirin EC 81 MG tablet Take 1 tablet (81 mg total) by mouth daily. 90 tablet 3  . atorvastatin (LIPITOR) 40 MG tablet Take 1 tablet (40 mg total) by mouth daily. 90 tablet 3  . azithromycin (ZITHROMAX Z-PAK) 250 MG tablet Take 2 tablets (500 mg) on  Day 1,  followed by 1 tablet (250 mg) once daily on Days 2 through 5. 6 each 0  . budesonide-formoterol (SYMBICORT) 80-4.5 MCG/ACT inhaler Inhale 2 puffs into the lungs 2 (two) times daily. 1 Inhaler 3  . metoprolol tartrate (LOPRESSOR) 25 MG tablet Take 1 tablet (25 mg total) by mouth 2 (two) times daily. 180 tablet 3  . predniSONE (DELTASONE) 20 MG tablet 3qd for 2d then 2qd for 2d then 1qd for 2d 12 tablet 0   No current facility-administered medications for this visit.     Past Medical History:  Diagnosis Date  . Panic attack     Past Surgical History:  Procedure Laterality Date  . COLONOSCOPY N/A 10/04/2015   Procedure: COLONOSCOPY;  Surgeon: Daneil Dolin, MD;  Location: [facility];  Service: Endoscopy;  Laterality: N/A;  7:30 AM  . KNEE ARTHROSCOPY W/ ACL RECONSTRUCTION    . rotator cuff surgery      Social History   Socioeconomic History  . Marital status: Married    Spouse name: Not on file  . Number of children: Not on file  . Years of education: Not on file  . Highest education level: Not on file  Occupational History  . Not on file  Social Needs  . Financial resource strain: Not on file  . Food insecurity:    Worry: Not on file    Inability: Not on file  . Transportation needs:    Medical: Not on file    Non-medical: Not on file  Tobacco Use  . Smoking status: Never Smoker  . Smokeless tobacco: Never Used  Substance and Sexual Activity  . Alcohol use: No  . Drug use: No  . Sexual activity: Not on file  Lifestyle  . Physical activity:    Days per week: Not on file    Minutes per session: Not on file  . Stress: Not on file  Relationships  . Social connections:    Talks on phone: Not on file    Gets together: Not on file    Attends religious service: Not on file    Active member of club or organization: Not on file  Attends meetings of clubs or organizations: Not on file    Relationship status: Not on file  . Intimate partner violence:    Fear of current or ex partner: Not on file    Emotionally abused: Not on file    Physically abused: Not on file    Forced sexual activity: Not on file  Other Topics Concern  . Not on file  Social History Narrative  . Not on file     Vitals:   02/20/19 1613  BP: 116/74  Pulse: 79  SpO2: 97%  Weight: 218 lb (98.9 kg)  Height: 6\' 1"  (1.854 m)    Wt Readings from Last 3 Encounters:  02/20/19 218 lb (98.9 kg)  02/16/19 215 lb (97.5 kg)  07/25/18 224 lb (101.6 kg)     PHYSICAL EXAM General: NAD HEENT: Normal. Neck: No JVD, no thyromegaly. Lungs: Clear to auscultation bilaterally with normal respiratory  effort. CV: Regular rate and rhythm, normal S1/S2, no U6/J3, soft systolic murmur over RUSB. No pretibial or periankle edema. Abdomen: Soft, nontender, no distention.  Neurologic: Alert and oriented.  Psych: Normal affect. Skin: Normal. Musculoskeletal: No gross deformities.    ECG: Reviewed above under Subjective   Labs: Lab Results  Component Value Date/Time   K 4.0 07/21/2018 11:18 AM   BUN 15 07/21/2018 11:18 AM   BUN 15 10/04/2017 03:32 PM   CREATININE 0.65 07/21/2018 11:18 AM   CREATININE 0.87 12/22/2014 10:17 AM   ALT 22 10/04/2017 03:32 PM   HGB 15.6 07/21/2018 11:18 AM     Lipids: Lab Results  Component Value Date/Time   LDLCALC 131 (H) 10/04/2017 03:32 PM   CHOL 213 (H) 10/04/2017 03:32 PM   TRIG 102 10/04/2017 03:32 PM   HDL 62 10/04/2017 03:32 PM       ASSESSMENT AND PLAN:  1.  Coronary artery disease: He has exertional dyspnea and chest pain with CCS class II angina.  He is currently on aspirin, metoprolol, and atorvastatin.  Nuclear stress test was abnormal suggestive of inferior infarct with peri-infarct ischemia.  Symptoms have not resolved with beta-blockers.  He wants a definitive answer.  I will arrange for right and left heart catheterization and coronary angiography. Risks and benefits of cardiac catheterization have been discussed with the patient.  These include bleeding, infection, kidney damage, stroke, heart attack, death.  The patient understands these risks and is willing to proceed.   Disposition: Follow up 1 month  A high level of decision making was required for increased medical complexities.    Kate Sable, M.D., F.A.C.C.

## 2019-02-20 NOTE — Progress Notes (Signed)
SUBJECTIVE: The patient presents for follow-up of exertional dyspnea and fatigue in the context of presumed coronary artery disease.  Nuclear stress test on 07/29/2018 showed prior myocardial infarction with peri-infarct ischemia involving the inferior wall.  It was deemed a low risk study.  Left ventricular systolic function was normal.  He has been experiencing progressive exertional dyspnea.  Chest x-ray on 02/16/2019 was normal.  He saw his PCP on 02/16/2019.  Dr. Wolfgang Phoenix reached out to me regarding the patient's shortness of breath as he was concerned this could be related to coronary artery disease.  He continues to experience exertional dyspnea with exertional right-sided chest pain which subsides with rest.  He did not notice any difference after taking metoprolol.  His PCP ordered pulmonary function testing but he has yet to pursue this.  He is a non-smoker.   Review of Systems: As per "subjective", otherwise negative.  No Known Allergies  Current Outpatient Medications  Medication Sig Dispense Refill  . aspirin EC 81 MG tablet Take 1 tablet (81 mg total) by mouth daily. 90 tablet 3  . atorvastatin (LIPITOR) 40 MG tablet Take 1 tablet (40 mg total) by mouth daily. 90 tablet 3  . azithromycin (ZITHROMAX Z-PAK) 250 MG tablet Take 2 tablets (500 mg) on  Day 1,  followed by 1 tablet (250 mg) once daily on Days 2 through 5. 6 each 0  . budesonide-formoterol (SYMBICORT) 80-4.5 MCG/ACT inhaler Inhale 2 puffs into the lungs 2 (two) times daily. 1 Inhaler 3  . metoprolol tartrate (LOPRESSOR) 25 MG tablet Take 1 tablet (25 mg total) by mouth 2 (two) times daily. 180 tablet 3  . predniSONE (DELTASONE) 20 MG tablet 3qd for 2d then 2qd for 2d then 1qd for 2d 12 tablet 0   No current facility-administered medications for this visit.     Past Medical History:  Diagnosis Date  . Panic attack     Past Surgical History:  Procedure Laterality Date  . COLONOSCOPY N/A 10/04/2015   Procedure: COLONOSCOPY;  Surgeon: Daneil Dolin, MD;  Location: [facility];  Service: Endoscopy;  Laterality: N/A;  7:30 AM  . KNEE ARTHROSCOPY W/ ACL RECONSTRUCTION    . rotator cuff surgery      Social History   Socioeconomic History  . Marital status: Married    Spouse name: Not on file  . Number of children: Not on file  . Years of education: Not on file  . Highest education level: Not on file  Occupational History  . Not on file  Social Needs  . Financial resource strain: Not on file  . Food insecurity:    Worry: Not on file    Inability: Not on file  . Transportation needs:    Medical: Not on file    Non-medical: Not on file  Tobacco Use  . Smoking status: Never Smoker  . Smokeless tobacco: Never Used  Substance and Sexual Activity  . Alcohol use: No  . Drug use: No  . Sexual activity: Not on file  Lifestyle  . Physical activity:    Days per week: Not on file    Minutes per session: Not on file  . Stress: Not on file  Relationships  . Social connections:    Talks on phone: Not on file    Gets together: Not on file    Attends religious service: Not on file    Active member of club or organization: Not on file  Attends meetings of clubs or organizations: Not on file    Relationship status: Not on file  . Intimate partner violence:    Fear of current or ex partner: Not on file    Emotionally abused: Not on file    Physically abused: Not on file    Forced sexual activity: Not on file  Other Topics Concern  . Not on file  Social History Narrative  . Not on file     Vitals:   02/20/19 1613  BP: 116/74  Pulse: 79  SpO2: 97%  Weight: 218 lb (98.9 kg)  Height: 6\' 1"  (1.854 m)    Wt Readings from Last 3 Encounters:  02/20/19 218 lb (98.9 kg)  02/16/19 215 lb (97.5 kg)  07/25/18 224 lb (101.6 kg)     PHYSICAL EXAM General: NAD HEENT: Normal. Neck: No JVD, no thyromegaly. Lungs: Clear to auscultation bilaterally with normal respiratory  effort. CV: Regular rate and rhythm, normal S1/S2, no R1/R9, soft systolic murmur over RUSB. No pretibial or periankle edema. Abdomen: Soft, nontender, no distention.  Neurologic: Alert and oriented.  Psych: Normal affect. Skin: Normal. Musculoskeletal: No gross deformities.    ECG: Reviewed above under Subjective   Labs: Lab Results  Component Value Date/Time   K 4.0 07/21/2018 11:18 AM   BUN 15 07/21/2018 11:18 AM   BUN 15 10/04/2017 03:32 PM   CREATININE 0.65 07/21/2018 11:18 AM   CREATININE 0.87 12/22/2014 10:17 AM   ALT 22 10/04/2017 03:32 PM   HGB 15.6 07/21/2018 11:18 AM     Lipids: Lab Results  Component Value Date/Time   LDLCALC 131 (H) 10/04/2017 03:32 PM   CHOL 213 (H) 10/04/2017 03:32 PM   TRIG 102 10/04/2017 03:32 PM   HDL 62 10/04/2017 03:32 PM       ASSESSMENT AND PLAN:  1.  Coronary artery disease: He has exertional dyspnea and chest pain with CCS class II angina.  He is currently on aspirin, metoprolol, and atorvastatin.  Nuclear stress test was abnormal suggestive of inferior infarct with peri-infarct ischemia.  Symptoms have not resolved with beta-blockers.  He wants a definitive answer.  I will arrange for right and left heart catheterization and coronary angiography. Risks and benefits of cardiac catheterization have been discussed with the patient.  These include bleeding, infection, kidney damage, stroke, heart attack, death.  The patient understands these risks and is willing to proceed.   Disposition: Follow up 1 month  A high level of decision making was required for increased medical complexities.    Kate Sable, M.D., F.A.C.C.

## 2019-02-20 NOTE — Patient Instructions (Signed)
    [place] [address] Dept: [phone] Loc: [place]  02/20/2019  You are scheduled for a Cardiac Catheterization on Thursday, March 19 with Dr. Daneen Schick.  1. Please arrive at the [facility] (Main Entrance A) at [facility]: [address] [place], [place] [address] at 8:30 AM (This time is two hours before your procedure to ensure your preparation). Free valet parking service is available.   Special note: Every effort is made to have your procedure done on time. Please understand that emergencies sometimes delay scheduled procedures.  2. Diet: Do not eat solid foods after midnight.  The patient may have clear liquids until 5am upon the day of the procedure.  3. Labs: You will need to have blood drawn on TOMORROW  4. Medication instructions in preparation for your procedure:   Contrast Allergy: No   On the morning of your procedure, take your Aspirin 81 mg  and any morning medicines NOT listed above.  You may use sips of water.  5. Plan for one night stay--bring personal belongings. 6. Bring a current list of your medications and current insurance cards. 7. You MUST have a responsible person to drive you home. 8. Someone MUST be with you the first 24 hours after you arrive home or your discharge will be delayed. 9. Please wear clothes that are easy to get on and off and wear slip-on shoes.  Thank you for allowing [place] to care for you!   -- [facility]

## 2019-02-21 ENCOUNTER — Other Ambulatory Visit (HOSPITAL_COMMUNITY)
Admission: RE | Admit: 2019-02-21 | Discharge: 2019-02-21 | Disposition: A | Discharge status: Home / Self Care | Payer: 59 | Source: Ambulatory Visit | Attending: Cardiovascular Disease | Admitting: Cardiovascular Disease

## 2019-02-21 ENCOUNTER — Encounter: Payer: Self-pay | Admitting: Family Medicine

## 2019-02-21 DIAGNOSIS — R9439 Abnormal result of other cardiovascular function study: Secondary | ICD-10-CM | POA: Diagnosis present

## 2019-02-21 DIAGNOSIS — Z01818 Encounter for other preprocedural examination: Secondary | ICD-10-CM | POA: Insufficient documentation

## 2019-02-21 LAB — CBC
HEMATOCRIT: 46.4 % (ref 39.0–52.0)
HEMOGLOBIN: 15 g/dL (ref 13.0–17.0)
MCH: 26.4 pg (ref 26.0–34.0)
MCHC: 32.3 g/dL (ref 30.0–36.0)
MCV: 81.5 fL (ref 80.0–100.0)
NRBC: 0 % (ref 0.0–0.2)
Platelets: 307 10*3/uL (ref 150–400)
RBC: 5.69 MIL/uL (ref 4.22–5.81)
RDW: 14.1 % (ref 11.5–15.5)
WBC: 11.5 10*3/uL — ABNORMAL HIGH (ref 4.0–10.5)

## 2019-02-21 LAB — BASIC METABOLIC PANEL
ANION GAP: 9 (ref 5–15)
BUN: 14 mg/dL (ref 6–20)
CHLORIDE: 105 mmol/L (ref 98–111)
CO2: 25 mmol/L (ref 22–32)
Calcium: 9.3 mg/dL (ref 8.9–10.3)
Creatinine, Ser: 0.62 mg/dL (ref 0.61–1.24)
GFR calc Af Amer: >60 mL/min (ref >60)
GFR calc non Af Amer: >60 mL/min (ref >60)
GLUCOSE: 77 mg/dL (ref 70–99)
Potassium: 4.1 mmol/L (ref 3.5–5.1)
Sodium: 139 mmol/L (ref 135–145)

## 2019-02-22 ENCOUNTER — Telehealth: Payer: Self-pay | Admitting: *Deleted

## 2019-02-22 NOTE — Telephone Encounter (Signed)
-----   Message from Herminio Commons, MD sent at 02/22/2019 11:02 AM EDT ----- Normal renal function.  White blood cells mildly elevated. No anemia.

## 2019-02-22 NOTE — Progress Notes (Signed)
  Coronavirus Screening  Have you experienced the following symptoms:  Cough yes/no: Yes Fever (>100.54F)  yes/no: No Runny nose yes/no: No Sore throat yes/no: No Difficulty breathing/shortness of breath  yes/no: No  Have you or a family member traveled in the last 14 days and where? yes/no: No   Please remind your patients and families that hospital visitation restrictions are in effect and the importance of the restrictions.   Thanks  [employer]

## 2019-02-22 NOTE — Telephone Encounter (Signed)
Called patient with test results. No answer. Left message to call back.  

## 2019-02-23 ENCOUNTER — Other Ambulatory Visit: Payer: Self-pay

## 2019-02-23 ENCOUNTER — Encounter (HOSPITAL_COMMUNITY)
Admission: RE | Disposition: A | Discharge status: Home / Self Care | Payer: Self-pay | Source: Home / Self Care | Attending: Interventional Cardiology

## 2019-02-23 ENCOUNTER — Ambulatory Visit (HOSPITAL_COMMUNITY)
Admission: RE | Admit: 2019-02-23 | Discharge: 2019-02-23 | Disposition: A | Discharge status: Home / Self Care | Payer: 59 | Attending: Interventional Cardiology | Admitting: Interventional Cardiology

## 2019-02-23 ENCOUNTER — Encounter (HOSPITAL_COMMUNITY): Payer: Self-pay | Admitting: Interventional Cardiology

## 2019-02-23 DIAGNOSIS — I209 Angina pectoris, unspecified: Secondary | ICD-10-CM

## 2019-02-23 DIAGNOSIS — Z7982 Long term (current) use of aspirin: Secondary | ICD-10-CM | POA: Diagnosis not present

## 2019-02-23 DIAGNOSIS — R0789 Other chest pain: Secondary | ICD-10-CM | POA: Diagnosis not present

## 2019-02-23 DIAGNOSIS — R0609 Other forms of dyspnea: Secondary | ICD-10-CM | POA: Diagnosis not present

## 2019-02-23 DIAGNOSIS — E785 Hyperlipidemia, unspecified: Secondary | ICD-10-CM | POA: Diagnosis present

## 2019-02-23 DIAGNOSIS — R9439 Abnormal result of other cardiovascular function study: Secondary | ICD-10-CM | POA: Diagnosis not present

## 2019-02-23 DIAGNOSIS — Z79899 Other long term (current) drug therapy: Secondary | ICD-10-CM | POA: Diagnosis not present

## 2019-02-23 HISTORY — PX: RIGHT/LEFT HEART CATH AND CORONARY ANGIOGRAPHY: CATH118266

## 2019-02-23 LAB — POCT I-STAT EG7
Bicarbonate: 24.8 mmol/L (ref 20.0–28.0)
Bicarbonate: 24.8 mmol/L (ref 20.0–28.0)
Bicarbonate: 25.3 mmol/L (ref 20.0–28.0)
CALCIUM ION: 1.27 mmol/L (ref 1.15–1.40)
Calcium, Ion: 1.24 mmol/L (ref 1.15–1.40)
Calcium, Ion: 1.25 mmol/L (ref 1.15–1.40)
HCT: 40 % (ref 39.0–52.0)
HCT: 41 % (ref 39.0–52.0)
HEMATOCRIT: 42 % (ref 39.0–52.0)
Hemoglobin: 13.6 g/dL (ref 13.0–17.0)
Hemoglobin: 13.9 g/dL (ref 13.0–17.0)
Hemoglobin: 14.3 g/dL (ref 13.0–17.0)
O2 Saturation: 79 %
O2 Saturation: 80 %
O2 Saturation: 81 %
PH VEN: 7.384 (ref 7.250–7.430)
POTASSIUM: 3.9 mmol/L (ref 3.5–5.1)
Potassium: 3.9 mmol/L (ref 3.5–5.1)
Potassium: 3.9 mmol/L (ref 3.5–5.1)
Sodium: 140 mmol/L (ref 135–145)
Sodium: 140 mmol/L (ref 135–145)
Sodium: 141 mmol/L (ref 135–145)
TCO2: 26 mmol/L (ref 22–32)
TCO2: 26 mmol/L (ref 22–32)
TCO2: 27 mmol/L (ref 22–32)
pCO2, Ven: 41 mmHg — ABNORMAL LOW (ref 44.0–60.0)
pCO2, Ven: 41.5 mmHg — ABNORMAL LOW (ref 44.0–60.0)
pCO2, Ven: 42.6 mmHg — ABNORMAL LOW (ref 44.0–60.0)
pH, Ven: 7.381 (ref 7.250–7.430)
pH, Ven: 7.391 (ref 7.250–7.430)
pO2, Ven: 44 mmHg (ref 32.0–45.0)
pO2, Ven: 44 mmHg (ref 32.0–45.0)
pO2, Ven: 47 mmHg — ABNORMAL HIGH (ref 32.0–45.0)

## 2019-02-23 LAB — POCT I-STAT 7, (LYTES, BLD GAS, ICA,H+H)
Bicarbonate: 24.3 mmol/L (ref 20.0–28.0)
CALCIUM ION: 1.23 mmol/L (ref 1.15–1.40)
HCT: 41 % (ref 39.0–52.0)
Hemoglobin: 13.9 g/dL (ref 13.0–17.0)
O2 Saturation: 98 %
POTASSIUM: 3.9 mmol/L (ref 3.5–5.1)
Sodium: 140 mmol/L (ref 135–145)
TCO2: 25 mmol/L (ref 22–32)
pCO2 arterial: 37.5 mmHg (ref 32.0–48.0)
pH, Arterial: 7.419 (ref 7.350–7.450)
pO2, Arterial: 105 mmHg (ref 83.0–108.0)

## 2019-02-23 SURGERY — RIGHT/LEFT HEART CATH AND CORONARY ANGIOGRAPHY
Anesthesia: LOCAL

## 2019-02-23 MED ORDER — SODIUM CHLORIDE 0.9 % IV SOLN: INTRAVENOUS | Status: DC

## 2019-02-23 MED ORDER — HEPARIN SODIUM (PORCINE) 1000 UNIT/ML IJ SOLN
INTRAMUSCULAR | Status: DC | PRN
  Administered 2019-02-23: 5000 [IU] via INTRAVENOUS

## 2019-02-23 MED ORDER — SODIUM CHLORIDE 0.9 % WEIGHT BASED INFUSION: 1.0000 mL/kg/h | INTRAVENOUS | Status: DC

## 2019-02-23 MED ORDER — SODIUM CHLORIDE 0.9 % IV SOLN: 250.0000 mL | INTRAVENOUS | Status: DC | PRN

## 2019-02-23 MED ORDER — ASPIRIN 81 MG PO CHEW: 81.0000 mg | CHEWABLE_TABLET | ORAL | Status: DC

## 2019-02-23 MED ORDER — SODIUM CHLORIDE 0.9% FLUSH: 3.0000 mL | INTRAVENOUS | Status: DC | PRN

## 2019-02-23 MED ORDER — LIDOCAINE HCL (PF) 1 % IJ SOLN
INTRAMUSCULAR | Status: AC
  Filled 2019-02-23: qty 30

## 2019-02-23 MED ORDER — ONDANSETRON HCL 4 MG/2ML IJ SOLN: 4.0000 mg | Freq: Four times a day (QID) | INTRAMUSCULAR | Status: DC | PRN

## 2019-02-23 MED ORDER — SODIUM CHLORIDE 0.9 % WEIGHT BASED INFUSION
3.0000 mL/kg/h | INTRAVENOUS | Status: AC
  Administered 2019-02-23: 3 mL/kg/h via INTRAVENOUS

## 2019-02-23 MED ORDER — HEPARIN (PORCINE) IN NACL 1000-0.9 UT/500ML-% IV SOLN
INTRAVENOUS | Status: AC
  Filled 2019-02-23: qty 1000

## 2019-02-23 MED ORDER — VERAPAMIL HCL 2.5 MG/ML IV SOLN
INTRAVENOUS | Status: DC | PRN
  Administered 2019-02-23: 10 mL via INTRA_ARTERIAL

## 2019-02-23 MED ORDER — FENTANYL CITRATE (PF) 100 MCG/2ML IJ SOLN
INTRAMUSCULAR | Status: AC
  Filled 2019-02-23: qty 2

## 2019-02-23 MED ORDER — HEPARIN SODIUM (PORCINE) 1000 UNIT/ML IJ SOLN
INTRAMUSCULAR | Status: AC
  Filled 2019-02-23: qty 1

## 2019-02-23 MED ORDER — SODIUM CHLORIDE 0.9% FLUSH: 3.0000 mL | Freq: Two times a day (BID) | INTRAVENOUS | Status: DC

## 2019-02-23 MED ORDER — MIDAZOLAM HCL 2 MG/2ML IJ SOLN
INTRAMUSCULAR | Status: DC | PRN
  Administered 2019-02-23: 1 mg via INTRAVENOUS
  Administered 2019-02-23: 0.5 mg via INTRAVENOUS

## 2019-02-23 MED ORDER — VERAPAMIL HCL 2.5 MG/ML IV SOLN
INTRAVENOUS | Status: AC
  Filled 2019-02-23: qty 2

## 2019-02-23 MED ORDER — MIDAZOLAM HCL 2 MG/2ML IJ SOLN
INTRAMUSCULAR | Status: AC
  Filled 2019-02-23: qty 2

## 2019-02-23 MED ORDER — HEPARIN (PORCINE) IN NACL 1000-0.9 UT/500ML-% IV SOLN
INTRAVENOUS | Status: DC | PRN
  Administered 2019-02-23 (×2): 500 mL

## 2019-02-23 MED ORDER — LIDOCAINE HCL (PF) 1 % IJ SOLN
INTRAMUSCULAR | Status: DC | PRN
  Administered 2019-02-23 (×2): 2 mL via INTRADERMAL

## 2019-02-23 MED ORDER — FENTANYL CITRATE (PF) 100 MCG/2ML IJ SOLN
INTRAMUSCULAR | Status: DC | PRN
  Administered 2019-02-23 (×2): 25 ug via INTRAVENOUS

## 2019-02-23 MED ORDER — ACETAMINOPHEN 325 MG PO TABS: 650.0000 mg | ORAL_TABLET | ORAL | Status: DC | PRN

## 2019-02-23 MED ORDER — OXYCODONE HCL 5 MG PO TABS: 5.0000 mg | ORAL_TABLET | ORAL | Status: DC | PRN

## 2019-02-23 MED ORDER — IOHEXOL 350 MG/ML SOLN
INTRAVENOUS | Status: DC | PRN
  Administered 2019-02-23: 70 mL via INTRAVENOUS

## 2019-02-23 SURGICAL SUPPLY — 12 items
CATH 5FR JL3.5 JR4 ANG PIG MP (CATHETERS) ×1 IMPLANT
CATH BALLN WEDGE 5F 110CM (CATHETERS) ×1 IMPLANT
DEVICE RAD TR BAND REGULAR (VASCULAR PRODUCTS) ×1 IMPLANT
GLIDESHEATH SLEND A-KIT 6F 22G (SHEATH) ×1 IMPLANT
GUIDEWIRE INQWIRE 1.5J.035X260 (WIRE) IMPLANT
INQWIRE 1.5J .035X260CM (WIRE) ×2
KIT HEART LEFT (KITS) ×2 IMPLANT
PACK CARDIAC CATHETERIZATION (CUSTOM PROCEDURE TRAY) ×2 IMPLANT
SHEATH GLIDE SLENDER 4/5FR (SHEATH) ×1 IMPLANT
SHEATH PROBE COVER 6X72 (BAG) ×1 IMPLANT
TRANSDUCER W/STOPCOCK (MISCELLANEOUS) ×2 IMPLANT
TUBING CIL FLEX 10 FLL-RA (TUBING) ×2 IMPLANT

## 2019-02-23 NOTE — CV Procedure (Addendum)
   Right heart cath performed via a previously established right antecubital IV using double glove technique.  Left heart cath via right radial approach using ultrasound guidance.  Normal coronary arteries.  Normal LV function and hemodynamics.  Normal pulmonary artery pressures.  Mixed venous O2 saturation 78%./RA O2 sat 81%.  Consider echocardiogram to exclude RV enlargement in which case partial anomalous pulmonary venous return could be a consideration.  Relatively high O2 saturation likely related to high cardiac output.

## 2019-02-23 NOTE — Interval H&P Note (Signed)
Cath Lab Visit (complete for each Cath Lab visit)  Clinical Evaluation Leading to the Procedure:   ACS: No.  Non-ACS:    Anginal Classification: CCS II  Anti-ischemic medical therapy: Minimal Therapy (1 class of medications)  Non-Invasive Test Results: Low-risk stress test findings: cardiac mortality <1%/year  Prior CABG: No previous CABG      History and Physical Interval Note:  02/23/2019 10:16 AM  Mario Meyer  has presented today for surgery, with the diagnosis of angina.  The various methods of treatment have been discussed with the patient and family. After consideration of risks, benefits and other options for treatment, the patient has consented to  Procedure(s): RIGHT/LEFT HEART CATH AND CORONARY ANGIOGRAPHY (N/A) as a surgical intervention.  The patient's history has been reviewed, patient examined, no change in status, stable for surgery.  I have reviewed the patient's chart and labs.  Questions were answered to the patient's satisfaction.     Belva Crome III

## 2019-02-23 NOTE — Discharge Instructions (Signed)
Radial Site Care ° °This sheet gives you information about how to care for yourself after your procedure. Your health care provider may also give you more specific instructions. If you have problems or questions, contact your health care provider. °What can I expect after the procedure? °After the procedure, it is common to have: °· Bruising and tenderness at the catheter insertion area. °Follow these instructions at home: °Medicines °· Take over-the-counter and prescription medicines only as told by your health care provider. °Insertion site care °· Follow instructions from your health care provider about how to take care of your insertion site. Make sure you: °? Wash your hands with soap and water before you change your bandage (dressing). If soap and water are not available, use hand sanitizer. °? Change your dressing as told by your health care provider. °? Leave stitches (sutures), skin glue, or adhesive strips in place. These skin closures may need to stay in place for 2 weeks or longer. If adhesive strip edges start to loosen and curl up, you may trim the loose edges. Do not remove adhesive strips completely unless your health care provider tells you to do that. °· Check your insertion site every day for signs of infection. Check for: °? Redness, swelling, or pain. °? Fluid or blood. °? Pus or a bad smell. °? Warmth. °· Do not take baths, swim, or use a hot tub until your health care provider approves. °· You may shower 24-48 hours after the procedure, or as directed by your health care provider. °? Remove the dressing and gently wash the site with plain soap and water. °? Pat the area dry with a clean towel. °? Do not rub the site. That could cause bleeding. °· Do not apply powder or lotion to the site. °Activity ° °· For 24 hours after the procedure, or as directed by your health care provider: °? Do not flex or bend the affected arm. °? Do not push or pull heavy objects with the affected arm. °? Do not  drive yourself home from the hospital or clinic. You may drive 24 hours after the procedure unless your health care provider tells you not to. °? Do not operate machinery or power tools. °· Do not lift anything that is heavier than 10 lb (4.5 kg), or the limit that you are told, until your health care provider says that it is safe. °· Ask your health care provider when it is okay to: °? Return to work or school. °? Resume usual physical activities or sports. °? Resume sexual activity. °General instructions °· If the catheter site starts to bleed, raise your arm and put firm pressure on the site. If the bleeding does not stop, get help right away. This is a medical emergency. °· If you went home on the same day as your procedure, a responsible adult should be with you for the first 24 hours after you arrive home. °· Keep all follow-up visits as told by your health care provider. This is important. °Contact a health care provider if: °· You have a fever. °· You have redness, swelling, or yellow drainage around your insertion site. °Get help right away if: °· You have unusual pain at the radial site. °· The catheter insertion area swells very fast. °· The insertion area is bleeding, and the bleeding does not stop when you hold steady pressure on the area. °· Your arm or hand becomes pale, cool, tingly, or numb. °These symptoms may represent a serious problem   that is an emergency. Do not wait to see if the symptoms will go away. Get medical help right away. Call your local emergency services (911 in the [place] Do not drive yourself to the hospital. °Summary °· After the procedure, it is common to have bruising and tenderness at the site. °· Follow instructions from your health care provider about how to take care of your radial site wound. Check the wound every day for signs of infection. °· Do not lift anything that is heavier than 10 lb (4.5 kg), or the limit that you are told, until your health care provider says  that it is safe. °This information is not intended to replace advice given to you by your health care provider. Make sure you discuss any questions you have with your health care provider. °Document Released: 12/26/2010 Document Revised: 12/29/2017 Document Reviewed: 12/29/2017 °Elsevier Interactive Patient Education © 2019 [employer] ° °

## 2019-02-23 NOTE — Telephone Encounter (Signed)
Left message to return call 

## 2019-02-27 NOTE — Telephone Encounter (Signed)
Per patient he is good he had a cardiac cath done on Thursday and he is not experiencing any shortness of breath,cough,no fever no other symptoms.He states he is "good now".

## 2019-02-27 NOTE — Telephone Encounter (Signed)
So noted 

## 2019-02-28 ENCOUNTER — Encounter: Payer: Self-pay | Admitting: *Deleted

## 2019-03-16 ENCOUNTER — Other Ambulatory Visit: Payer: Self-pay

## 2019-03-16 ENCOUNTER — Ambulatory Visit (INDEPENDENT_AMBULATORY_CARE_PROVIDER_SITE_OTHER): Payer: 59 | Admitting: Family Medicine

## 2019-03-16 ENCOUNTER — Telehealth: Payer: Self-pay | Admitting: *Deleted

## 2019-03-16 ENCOUNTER — Encounter: Payer: Self-pay | Admitting: Family Medicine

## 2019-03-16 DIAGNOSIS — J301 Allergic rhinitis due to pollen: Secondary | ICD-10-CM | POA: Diagnosis not present

## 2019-03-16 DIAGNOSIS — J4521 Mild intermittent asthma with (acute) exacerbation: Secondary | ICD-10-CM | POA: Diagnosis not present

## 2019-03-16 DIAGNOSIS — J019 Acute sinusitis, unspecified: Secondary | ICD-10-CM

## 2019-03-16 MED ORDER — BUDESONIDE-FORMOTEROL FUMARATE 80-4.5 MCG/ACT IN AERO: 2.0000 | INHALATION_SPRAY | Freq: Two times a day (BID) | RESPIRATORY_TRACT | 3 refills | Status: DC

## 2019-03-16 MED ORDER — AMOXICILLIN-POT CLAVULANATE 875-125 MG PO TABS: 1.0000 | ORAL_TABLET | Freq: Two times a day (BID) | ORAL | 0 refills | Status: DC

## 2019-03-16 NOTE — Telephone Encounter (Signed)
   Cardiac Questionnaire:    Since your last visit or hospitalization:    1. Have you been having new or worsening chest pain? No   2. Have you been having new or worsening shortness of breath? No 3. Have you been having new or worsening leg swelling, wt gain, or increase in abdominal girth (pants fitting more tightly)? No   4. Have you had any passing out spells? No    *A YES to any of these questions would result in the appointment being kept. *If all the answers to these questions are NO, we should indicate that given the current situation regarding the worldwide coronarvirus pandemic, at the recommendation of the CDC, we are looking to limit gatherings in our waiting area, and thus will reschedule their appointment beyond four weeks from today.   _____________   Pt states he will call back to reschedule

## 2019-03-16 NOTE — Progress Notes (Signed)
This patient had phone visit Unable to do video visit Patient is present at home I was at work   Patient relates cough  Has had ongoing cough for several weeks no longer having shortness of breath did use a prednisone azithromycin and the inhaler for a couple weeks it seemed to help some but stopped taking it states he is having mainly a cough that brings up yellowish phlegm denies high fever chills sweats denies wheezing denies difficulty breathing  Recently had cardiac evaluation and catheterization please see notations in the chart overall had good outcomes of this  After further discussion with the patient it was felt that the patient was having more than likely a secondary bronchitis possibly allergic rhinitis with acute rhinosinusitis and postnasal drainage Recommended 2 weeks Augmentin 875 mg twice daily 2 weeks Recommend Symbicort inhaler ongoing Patient to give [place] feedback in a course of approximately 2 to 3 weeks Also use OTC allergy medicine If ongoing troubles with this will need referral to pulmonology Patient voiced understanding Additional message sent to the patient via my chart

## 2019-03-16 NOTE — Progress Notes (Signed)
   Subjective:    Patient ID: Mario Meyer, male    DOB: [birth year], 58 y.o.   MRN: [id number] See other documentation Phone visit Patient unable to do video. Cough  This is a new problem. The current episode started 1 to 4 weeks ago.   See other documentation   Review of Systems  Respiratory: Positive for cough.    Virtual Visit via Video Note  I connected with Mario Meyer on 03/16/19 at  9:30 AM EDT by a video enabled telemedicine application and verified that I am speaking with the correct person using two identifiers.   I discussed the limitations of evaluation and management by telemedicine and the availability of in person appointments. The patient expressed understanding and agreed to proceed.  History of Present Illness:    Observations/Objective:   Assessment and Plan:   Follow Up Instructions:    I discussed the assessment and treatment plan with the patient. The patient was provided an opportunity to ask questions and all were answered. The patient agreed with the plan and demonstrated an understanding of the instructions.   The patient was advised to call back or seek an in-person evaluation if the symptoms worsen or if the condition fails to improve as anticipated.  I provided 15 minutes of non-face-to-face time during this encounter.         Objective:   Physical Exam        Assessment & Plan:

## 2019-03-22 ENCOUNTER — Ambulatory Visit: Payer: 59 | Admitting: Student

## 2019-04-20 ENCOUNTER — Other Ambulatory Visit: Payer: Self-pay | Admitting: *Deleted

## 2019-04-20 DIAGNOSIS — R0602 Shortness of breath: Secondary | ICD-10-CM

## 2019-04-28 ENCOUNTER — Other Ambulatory Visit (HOSPITAL_COMMUNITY): Payer: 59

## 2019-06-12 ENCOUNTER — Ambulatory Visit: Payer: 59 | Admitting: Orthopedic Surgery

## 2019-06-12 ENCOUNTER — Ambulatory Visit (INDEPENDENT_AMBULATORY_CARE_PROVIDER_SITE_OTHER): Payer: 59

## 2019-06-12 ENCOUNTER — Encounter: Payer: Self-pay | Admitting: Orthopedic Surgery

## 2019-06-12 ENCOUNTER — Other Ambulatory Visit: Payer: Self-pay

## 2019-06-12 VITALS — BP 166/92 | HR 96 | Temp 99.3°F | Ht 73.0 in | Wt 205.0 lb

## 2019-06-12 DIAGNOSIS — M25511 Pain in right shoulder: Secondary | ICD-10-CM | POA: Diagnosis not present

## 2019-06-12 DIAGNOSIS — M75121 Complete rotator cuff tear or rupture of right shoulder, not specified as traumatic: Secondary | ICD-10-CM

## 2019-06-12 DIAGNOSIS — G8929 Other chronic pain: Secondary | ICD-10-CM

## 2019-06-12 NOTE — Progress Notes (Signed)
Mario Meyer  06/12/2019  HISTORY SECTION :  Chief Complaint  Patient presents with  . 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       Chest pain is been fully evaluated by cardiology found to have no ischemic disease  All other systems reviewed and are negative.    Past Medical History:  Diagnosis Date  . Panic attack     Past Surgical History:  Procedure Laterality Date  . COLONOSCOPY N/A 10/04/2015   Procedure: COLONOSCOPY;  Surgeon: Daneil Dolin, MD;  [facility]: [facility];  Service: Endoscopy;  Laterality: N/A;  7:30 AM  . KNEE ARTHROSCOPY W/ ACL RECONSTRUCTION    . RIGHT/LEFT HEART CATH AND CORONARY ANGIOGRAPHY N/A 02/23/2019   Procedure: RIGHT/LEFT HEART CATH AND CORONARY ANGIOGRAPHY;  Surgeon: Belva Crome, MD;  Location: [facility];  Service: Cardiovascular;  Laterality: N/A;  . rotator cuff surgery       No Known Allergies   Current Outpatient Medications:  .  aspirin EC 81 MG tablet, Take 1 tablet (81 mg total) by mouth daily., Disp: 90 tablet, Rfl: 3 .  atorvastatin (LIPITOR) 40 MG tablet, Take 1 tablet (40 mg total) by mouth daily. (Patient not taking: Reported on 02/22/2019), Disp: 90 tablet, Rfl: 3 .  budesonide-formoterol (SYMBICORT) 80-4.5 MCG/ACT inhaler, Inhale 2 puffs into the lungs 2 (two) times daily.  (Patient not taking: Reported on 06/12/2019), Disp: 1 Inhaler, Rfl: 3 .  metoprolol tartrate (LOPRESSOR) 25 MG tablet, Take 1 tablet (25 mg total) by mouth 2 (two) times daily. (Patient not taking: Reported on 02/22/2019), Disp: 180 tablet, Rfl: 3   PHYSICAL EXAM SECTION: 1) BP (!) 166/92   Pulse 96   Temp 99.3 F (37.4 C)   Ht 6\' 1"  (1.854 m)   Wt 205 lb (93 kg)   BMI 27.05 kg/m   Body mass index is 27.05 kg/m. General appearance: Well-developed well-nourished no gross deformities  2) Cardiovascular normal pulse and perfusion in all 4 extremities normal color without edema  3) Neurologically deep tendon reflexes are equal and normal, no sensation loss or deficits no pathologic reflexes  4) Psychological: Awake alert and oriented x3 mood and affect normal  5) Skin no lacerations or ulcerations no nodularity no palpable masses, no erythema or nodularity  6) Musculoskeletal: Left shoulder excellent strength full range of motion skin looks normal shoulder feels stable full range of motion is noted lymph nodes are negative sensation is intact  Right shoulder curvilinear incision from prior rotator cuff surgery he has weakness on abduction and external rotation normal internal rotation but painful internal rotation against resistance active range of motion is only 90 degrees of abduction and 80 degrees of flexion is painful passive range of motion up to 130 degrees with a positive drop test positive lag test   MEDICAL DECISION SECTION:  Encounter Diagnoses  Name  Primary?  . Chronic right shoulder pain Yes  . Nontraumatic complete tear of right rotator cuff     Imaging 3 views see report normal right shoulder x-ray  Plan:  (Rx., Inj., surg., Frx, MRI/CT, XR:2) MRI scan patient has a rotator cuff tear probably complete question is whether he has atrophy and whether he needs a repair or replacement   3:37 PM

## 2019-06-12 NOTE — Addendum Note (Signed)
Addended byCandice Camp on: 06/12/2019 03:41 PM   Modules accepted: Orders

## 2019-06-13 ENCOUNTER — Telehealth: Payer: Self-pay | Admitting: Radiology

## 2019-06-13 NOTE — Telephone Encounter (Signed)
Called him again, he left me a message, I have left him another.

## 2019-06-13 NOTE — Telephone Encounter (Signed)
MRI sch for 9 am on 06/16/19 arrive at 8:30 called patient left message to advise.

## 2019-06-16 ENCOUNTER — Ambulatory Visit (HOSPITAL_COMMUNITY): Payer: 59

## 2019-06-19 ENCOUNTER — Other Ambulatory Visit: Payer: Self-pay

## 2019-06-19 ENCOUNTER — Ambulatory Visit (HOSPITAL_COMMUNITY)
Admission: RE | Admit: 2019-06-19 | Discharge: 2019-06-19 | Disposition: A | Discharge status: Home / Self Care | Payer: 59 | Source: Ambulatory Visit | Attending: Orthopedic Surgery | Admitting: Orthopedic Surgery

## 2019-06-19 DIAGNOSIS — G8929 Other chronic pain: Secondary | ICD-10-CM | POA: Insufficient documentation

## 2019-06-19 DIAGNOSIS — M25511 Pain in right shoulder: Secondary | ICD-10-CM | POA: Insufficient documentation

## 2019-06-26 ENCOUNTER — Other Ambulatory Visit: Payer: Self-pay | Admitting: Orthopedic Surgery

## 2019-06-26 ENCOUNTER — Telehealth: Payer: Self-pay | Admitting: Radiology

## 2019-06-26 DIAGNOSIS — M75121 Complete rotator cuff tear or rupture of right shoulder, not specified as traumatic: Secondary | ICD-10-CM

## 2019-06-26 DIAGNOSIS — G8929 Other chronic pain: Secondary | ICD-10-CM

## 2019-06-26 MED ORDER — MELOXICAM 7.5 MG PO TABS: 7.5000 mg | ORAL_TABLET | Freq: Every day | ORAL | 5 refills | Status: DC

## 2019-06-26 NOTE — Progress Notes (Signed)
Mr. Mario Meyer came in today to get exercises for his shoulder his MRI did not show a repairable rotator cuff tear  We also prescribed an NSAID for him  Meds ordered this encounter  Medications  . meloxicam (MOBIC) 7.5 MG tablet    Sig: Take 1 tablet (7.5 mg total) by mouth daily.    Dispense:  30 tablet    Refill:  5

## 2019-06-26 NOTE — Telephone Encounter (Signed)
Patient needs NSAID please advise

## 2019-07-14 ENCOUNTER — Encounter: Payer: Self-pay | Admitting: Orthopedic Surgery

## 2019-08-23 ENCOUNTER — Encounter: Payer: Self-pay | Admitting: Orthopedic Surgery

## 2019-08-23 ENCOUNTER — Other Ambulatory Visit: Payer: Self-pay

## 2019-08-23 ENCOUNTER — Ambulatory Visit (INDEPENDENT_AMBULATORY_CARE_PROVIDER_SITE_OTHER): Payer: 59 | Admitting: Orthopedic Surgery

## 2019-08-23 VITALS — BP 128/72 | HR 68 | Ht 73.0 in | Wt 215.0 lb

## 2019-08-23 DIAGNOSIS — M25511 Pain in right shoulder: Secondary | ICD-10-CM | POA: Diagnosis not present

## 2019-08-23 DIAGNOSIS — Z9889 Other specified postprocedural states: Secondary | ICD-10-CM

## 2019-08-23 DIAGNOSIS — M7521 Bicipital tendinitis, right shoulder: Secondary | ICD-10-CM | POA: Diagnosis not present

## 2019-08-23 DIAGNOSIS — G8929 Other chronic pain: Secondary | ICD-10-CM | POA: Diagnosis not present

## 2019-08-23 NOTE — Progress Notes (Signed)
Mario Meyer  08/23/2019  HISTORY SECTION :  Chief Complaint  Patient presents with  . Shoulder Pain    right/ getting worse, decreased ROM increased pain    HPI The patient presents for reevaluation of his right shoulder which is getting worse.  He is already had an MRI which showed thinning of her previous rotator cuff repair and mild biceps tendinitis but after injection and oral medication he says his shoulder is worse his motion has gotten worse  Location right shoulder Duration 6 months Quality dull ache with throbbing Severity 10   Review of Systems  Cardiovascular: Positive for chest pain.       Chest pain is been fully evaluated by cardiology found to have no ischemic disease  All other systems reviewed and are negative.    has a past medical history of Panic attack.   Past Surgical History:  Procedure Laterality Date  . COLONOSCOPY N/A 10/04/2015   Procedure: COLONOSCOPY;  Surgeon: Daneil Dolin, MD;  Location: [facility];  Service: Endoscopy;  Laterality: N/A;  7:30 AM  . KNEE ARTHROSCOPY W/ ACL RECONSTRUCTION    . RIGHT/LEFT HEART CATH AND CORONARY ANGIOGRAPHY N/A 02/23/2019   Procedure: RIGHT/LEFT HEART CATH AND CORONARY ANGIOGRAPHY;  Surgeon: Belva Crome, MD;  Location: [facility];  Service: Cardiovascular;  Laterality: N/A;  . rotator cuff surgery     Past Medical History:  Diagnosis Date  . Panic attack    Social History   Tobacco Use  . Smoking status: Never Smoker  . Smokeless tobacco: Never Used  Substance Use Topics  . Alcohol use: No  . Drug use: No   Family History  Problem Relation Age of Onset  . Cancer Mother      Body mass index is 28.37 kg/m.   No Known Allergies   Current Outpatient Medications:  .  aspirin EC 81 MG tablet, Take 1 tablet (81 mg total) by mouth daily., Disp: 90 tablet, Rfl: 3 .  atorvastatin (LIPITOR) 40 MG tablet, Take 1 tablet (40 mg total) by mouth daily. (Patient not taking: Reported on  02/22/2019), Disp: 90 tablet, Rfl: 3 .  budesonide-formoterol (SYMBICORT) 80-4.5 MCG/ACT inhaler, Inhale 2 puffs into the lungs 2 (two) times daily. (Patient not taking: Reported on 06/12/2019), Disp: 1 Inhaler, Rfl: 3 .  meloxicam (MOBIC) 7.5 MG tablet, Take 1 tablet (7.5 mg total) by mouth daily. (Patient not taking: Reported on 08/23/2019), Disp: 30 tablet, Rfl: 5 .  metoprolol tartrate (LOPRESSOR) 25 MG tablet, Take 1 tablet (25 mg total) by mouth 2 (two) times daily. (Patient not taking: Reported on 02/22/2019), Disp: 180 tablet, Rfl: 3   PHYSICAL EXAM SECTION: 1) BP 128/72   Pulse 68   Ht 6\' 1"  (1.854 m)   Wt 215 lb (97.5 kg)   BMI 28.37 kg/m   Body mass index is 28.37 kg/m. General appearance: Well-developed well-nourished no gross deformities  2) Cardiovascular normal pulse and perfusion in the upper extremities normal color without edema  3) Neurologically deep tendon reflexes are equal and normal, no sensation loss or deficits no pathologic reflexes  4) Psychological: Awake alert and oriented x3 mood and affect normal  5) Skin no lacerations or ulcerations no nodularity no palpable masses, no erythema or nodularity  6) Musculoskeletal:   Left shoulder is nontender no swelling full range of motion no weakness  Right shoulder most of the tenderness is in the front of the shoulder and rotator interval in the  biceps muscle and tendon He has notable decrease external rotation compared to the left shoulder has painful abduction from 0 to 90 degrees Passive flexion is 150 degrees active flexion is 90 degrees with pain.  We do feel that the supraspinatus has weakness   MEDICAL DECISION SECTION:  Encounter Diagnoses  Name Primary?  . Chronic right shoulder pain Yes  . Biceps tendinitis of right upper extremity   . Status post right rotator cuff repair     Imaging Previous MRI IMPRESSION: 1. Mild thinning of the supraspinatus and infraspinatus tendons likely due to mild  tendinopathy and potentially some mild partial thickness articular surface tearing. No full-thickness tear is Identified.  2. Metal artifact likely from microscopic metallic particles along the rotator interval and anterior supraspinatus region.  3. Mild biceps tendinopathy.  4. Mild degenerative chondral thinning in the glenohumeral joint. Mild degenerative spurring in the [facility] joint.     Electronically Signed   By: Van Clines M.D.   On: 06/19/2019 17:32   Plan:  (Rx., Inj., surg., Frx, MRI/CT, XR:2)  After much discussion the patient says he wants something done he cannot use his arm.  So we are going to proceed with   arthroscopy right shoulder may or may not have to repair his rotator cuff he is aware and then do a biceps tenodesis with open technique  The procedure has been fully reviewed with the patient; The risks and benefits of surgery have been discussed and explained and understood. Alternative treatment has also been reviewed, questions were encouraged and answered. The postoperative plan is also been reviewed.   5:20 PM Arther Abbott, MD  08/23/2019

## 2019-08-23 NOTE — Patient Instructions (Addendum)
Arthroscopy right shoulder and biceps tenodesis    Proximal Biceps Tendinitis and Tenosynovitis  The proximal biceps tendon is a strong cord of tissue that connects the biceps muscle on the front of the upper arm to the shoulder blade. Tendinitis is inflammation of a tendon. Tenosynovitis is inflammation of the lining around the tendon (tendon sheath). These conditions often occur at the same time, and they can interfere with the ability to bend the elbow and turn the palm of the hand up. Proximal biceps tendinitis and tenosynovitis are usually caused by overusing the shoulder joint and the biceps muscle. These conditions usually heal within 6 weeks. Proximal biceps tendinitis may include a grade 1 or grade 2 strain of the tendon.  A grade 1 strain is mild, and it involves a slight pull of the tendon without any stretching or noticeable tearing of the tendon. There is usually no loss of biceps muscle strength.  A grade 2 strain is moderate, and it involves a small tear in the tendon. The tendon is stretched, and biceps strength is usually decreased. What are the causes? This condition may be caused by:  A sudden increase in frequency or intensity of activity that involves the shoulder and the biceps muscle.  Overuse of the biceps muscle. This can happen when you do the same movements over and over, such as: ? Turning the palm of the hand up. ? Forceful straightening (hyperextension) of the elbow. ? Bending the elbow.  A direct, forceful hit or injury to the elbow. This is rare. What increases the risk? The following factors may make you more likely to develop this condition:  Playing contact sports.  Playing sports that involve throwing and overhead movements, including racket sports, gymnastics, weight lifting, or bodybuilding.  Doing physical labor.  Having poor strength and flexibility of the arm and shoulder. What are the signs or symptoms? Symptoms of this condition may  include:  Pain and inflammation in the front of the shoulder.  A feeling of warmth in the front of the shoulder.  Limited range of motion of the shoulder and the elbow.  A crackling sound (crepitation) when you move or touch the shoulder or the upper arm. In some cases, symptoms may return after treatment, and they may be long-lasting (chronic). How is this diagnosed? This condition is diagnosed based on:  Your symptoms.  Your medical history.  Physical exam.  X-ray or MRI, if needed. How is this treated? Treatment for this condition depends on the severity of your injury. It may include:  Resting the injured arm.  Icing the injured area.  Doing physical therapy. Your health care provider may also use:  Medicines to treat pain and inflammation.  Sound waves to treat the injured muscle (ultrasound therapy).  Medicines that are injected to the muscle (corticosteroids).  Medicines that numb the area (local anesthetics).  Surgery. This is done if other treatments have not worked. Follow these instructions at home: Managing pain, stiffness, and swelling      If directed, put ice on the injured area. ? Put ice in a plastic bag. ? Place a towel between your skin and the bag. ? Leave the ice on for 20 minutes, 2-3 times a day.  If directed, apply heat to the affected area before you exercise. Use the heat source that your health care provider recommends, such as a moist heat pack or a heating pad. ? Place a towel between your skin and the heat source. ? Leave the heat  on for 20-30 minutes. ? Remove the heat if your skin turns bright red. This is especially important if you are unable to feel pain, heat, or cold. You may have a greater risk of getting burned.  Move your fingers often to reduce stiffness and swelling.  Raise (elevate) the injured area above the level of your heart while you are lying down. Activity  Do not lift anything that is heavier than 10 lb  (4.5 kg), or the limit that you are told, until your health care provider says that it is safe.  Avoid activities that cause pain or make your condition worse.  Return to your normal activities as told by your health care provider. Ask your health care provider what activities are safe for you.  Do exercises as told by your health care provider. General instructions  Take over-the-counter and prescription medicines only as told by your health care provider.  Do not use any products that contain nicotine or tobacco, such as cigarettes, e-cigarettes, and chewing tobacco. These can delay healing. If you need help quitting, ask your health care provider.  Keep all follow-up visits as told by your health care provider. This is important. How is this prevented?  Warm up and stretch before being active.  Cool down and stretch after being active.  Give your body time to rest between periods of activity.  Make sure any equipment that you use is fitted to you.  Be safe and responsible while being active to avoid falls.  Maintain physical fitness, including: ? Strength. ? Flexibility. ? Heart health (cardiovascular fitness). ? The ability to use muscles for a long time (endurance). Contact a health care provider if:  You have symptoms that get worse or do not get better after 2 weeks of treatment.  You develop new symptoms. Get help right away if:  You develop severe pain. Summary  Tendinitis is inflammation of the biceps tendon. Tenosynovitis is inflammation of the lining around the biceps tendon. These conditions often occur at the same time.  These conditions are usually caused by overusing the shoulder joint and biceps muscle.  Symptoms include pain, warmth in the shoulder, and limited range of motion.  The two conditions are treated with rest, ice, medicines, and surgery (rare). This information is not intended to replace advice given to you by your health care provider.  Make sure you discuss any questions you have with your health care provider. Document Released: 11/23/2005 Document Revised: 03/21/2019 Document Reviewed: 01/19/2019 Elsevier Patient Education  2020 [employer].

## 2019-08-25 NOTE — Addendum Note (Signed)
Addended byCandice Camp on: 08/25/2019 11:13 AM   Modules accepted: Orders, SmartSet

## 2019-08-25 NOTE — Patient Instructions (Signed)
Mario Meyer  08/25/2019     @PREFPERIOPPHARMACY @   Your procedure is scheduled on  08/31/2019 .  Report to [name] at  [place]   A.M.  Call this number if you have problems the morning of surgery:  [phone]   Remember:  Do not eat or drink after midnight.                        Take these medicines the morning of surgery with A SIP OF WATER  Mobic(if needed), metoprolol. Use your inhaler before you come.    Do not wear jewelry, make-up or nail polish.  Do not wear lotions, powders, or perfumes, or deodorant. Please brush your teeth.  Do not shave 48 hours prior to surgery.  Men may shave face and neck.  Do not bring valuables to the hospital.  [facility] is not responsible for any belongings or valuables.  Contacts, dentures or bridgework may not be worn into surgery.  Leave your suitcase in the car.  After surgery it may be brought to your room.  For patients admitted to the hospital, discharge time will be determined by your treatment team.  Patients discharged the day of surgery will not be allowed to drive home.   Name and phone number of your driver:    Family   Special instructions:  None  Please read over the following fact sheets that you were given. Anesthesia Post-op Instructions and Care and Recovery After Surgery       Surgery for Distal Biceps Tendon Tear, Care After This sheet gives you information about how to care for yourself after your procedure. Your health care provider may also give you more specific instructions. If you have problems or questions, contact your health care provider. What can I expect after the procedure? After the procedure, it is common to have:  Pain and swelling in your elbow.  Stiffness in your arm. Follow these instructions at home: If you have a splint, brace, or sling:  Wear it as told by your health care provider. Remove it only as told by your health care provider.  Loosen it if your fingers  tingle, become numb, or turn cold and blue.  Keep it clean and dry. If you have a cast:  Do not stick anything inside the cast to scratch your skin. Doing that increases your risk of infection.  Check the skin around the cast every day. Tell your health care provider about any concerns.  You may put lotion on dry skin around the edges of the cast. Do not put lotion on the skin underneath the cast.  Keep it clean and dry. Bathing  Do not take baths, swim, or use a hot tub until your health care provider approves. Ask your health care provider if you may take showers. You may only be allowed to take sponge baths.  If you have a cast, splint, brace, or sling that is not waterproof: ? Do not let it get wet. ? Cover it with a watertight covering when you take a bath or shower.  Keep your bandage (dressing) dry until your health care provider says it can be removed. Incision care   Follow instructions from your health care provider about how to take care of your incisions. Make sure you: ? Wash your hands with soap and water before and after you change your dressing. If soap and water  are not available, use hand sanitizer. ? Change your dressing as told by your health care provider. ? Leave stitches (sutures), skin glue, or adhesive strips in place. These skin closures may need to stay in place for 2 weeks or longer. If adhesive strip edges start to loosen and curl up, you may trim the loose edges. Do not remove adhesive strips completely unless your health care provider tells you to do that.  Check your incision area every day for signs of infection. Check for: ? Redness. ? Warmth. ? Fluid or blood. ? Pus or a bad smell. ? More swelling or pain. Managing pain, stiffness, and swelling   If directed, put ice on the injured area. ? If you have a removable splint, brace, or sling, remove it as told by your health care provider. ? Put ice in a plastic bag. ? Place a towel between your  skin and the bag or between your cast and the bag. ? Leave the ice on for 20 minutes, 2-3 times a day.  Move your fingers often to reduce stiffness and swelling.  Raise (elevate) the injured area above the level of your heart while you are sitting or lying down. Put 2 or 3 pillows under the elbow so that it is above the level of the heart. Activity  Do not lift anything with your injured arm until your health care provider says that you can.  Do not use your injured arm to support your body weight until your health care provider says that you can.  Avoid activities that cause pain or make your condition worse.  Return to your normal activities as told by your health care provider. Ask your health care provider what activities are safe for you.  Do exercises as told by your health care provider. Driving  Do not drive for 24 hours if you were given a sedative during your procedure.  Ask your health care provider when it is safe to drive if you have a splint, brace, sling, or cast on your arm. Medicines  Take over-the-counter and prescription medicines only as told by your health care provider.  Ask your health care provider if the medicine prescribed to you: ? Requires you to avoid driving or using heavy machinery. ? Can cause constipation. You may need to take actions to prevent or treat constipation, such as:  Drink enough fluid to keep your urine pale yellow.  Take over-the-counter or prescription medicines.  Eat foods that are high in fiber, such as beans, whole grains, and fresh fruits and vegetables.  Limit foods that are high in fat and processed sugars, such as fried or sweet foods. General instructions  If you have a cast or splint, do not put pressure on any part of it until it is fully hardened. This may take several hours.  Do not use any products that contain nicotine or tobacco, such as cigarettes, e-cigarettes, and chewing tobacco. These can delay incision  healing after surgery. If you need help quitting, ask your health care provider.  Keep all follow-up visits as told by your health care provider. This is important. Contact a health care provider if:  You have redness around your incision.  Your incision feels warm to the touch.  You have fluid or blood coming from your incision.  You have more swelling or pain at your incision.  You have pus or a bad smell coming from your incision.  You have a fever.  Bright red blood has soaked through  the bandage covering your incision.  Your hands or fingers are tingly, weak, or numb.  Your cast or splint feels too tight. Get help right away if:  You develop pain, numbness, or coldness in your hand or your hand changes color.  Your fingernails turn a dark color, such as blue or gray.  You have severe pain. Summary  If you have a splint, brace, or sling, wear it as told by your health care provider.  Ask your health care provider what activities are safe for you.  Move your fingers often to reduce stiffness and swelling.  Keep all follow-up visits as told by your health care provider. This information is not intended to replace advice given to you by your health care provider. Make sure you discuss any questions you have with your health care provider. Document Released: 11/23/2005 Document Revised: 11/11/2018 Document Reviewed: 11/11/2018 Elsevier Patient Education  [place].  Shoulder Arthroscopy, Care After This sheet gives you information about how to care for yourself after your procedure. Your health care provider may also give you more specific instructions. If you have problems or questions, contact your health care provider. What can I expect after the procedure? After the procedure, it is common to have:  Pain that can be relieved by taking pain medicine.  Swelling.  A small amount of fluid from the incision.  Stiffness that improves over time. Follow  these instructions at home: If you have a sling or immobilizer:  Wear the sling or immobilizer as told by your health care provider. Remove it only as told by your health care provider. These devices protect your shoulder and help it heal by keeping it in place.  Loosen the sling or immobilizer if your fingers tingle, become numb, or turn cold and blue.  Keep the sling or immobilizer clean.  Ask if you may remove the sling or immobilizer for bathing. If you need to keep it on while bathing and it is not waterproof: ? Do not let it get wet. ? Cover it with a watertight covering when you take a bath or a shower. Incision care   Follow instructions from your health care provider about how to take care of your incisions. Make sure you: ? Wash your hands with soap and water before you change your bandage (dressing). If soap and water are not available, use hand sanitizer. ? Change your dressing as told by your health care provider. ? Leave stitches (sutures), staples, skin glue, or adhesive strips in place. These skin closures may need to stay in place for 2 weeks or longer. If adhesive strip edges start to loosen and curl up, you may trim the loose edges. Do not remove adhesive strips completely unless your health care provider tells you to do that.  Check your incision areas every day for signs of infection. Check for: ? Redness ? More swelling or pain. ? Blood or more fluid. ? Warmth. ? Pus or a bad smell. Bathing  Do not take baths, swim, or use a hot tub until your health care provider approves. Ask your health care provider if you may take showers. You may only be allowed to take sponge baths. Activity  Ask your health care provider what activities are safe for you during recovery, and ask what activities you need to avoid.  Do not lift with your affected shoulder until your health care provider approves.  Avoid pulling and pushing with the arm on your affected side.  If  physical therapy was prescribed, do exercises as directed. Doing exercises may help to improve shoulder movement and flexibility (range of motion). Driving  Do not drive until your health care provider approves.  Do not drive or use heavy machinery while taking prescription pain medicine. Managing pain, stiffness, and swelling   If lying down flat causes shoulder discomfort, it may help to sleep in a sitting position for a few days after your procedure. Try sleeping in a reclining chair or propping yourself up with extra pillows in bed.  If directed, put ice on the affected area: ? Put ice in a plastic bag or use the icing device (cold therapy unit) that you were given. Follow instructions from your health care provider about how to use the icing device. ? Place a towel between your skin and the bag or between your skin and the icing device. ? Leave the ice on for 20 minutes, 2-3 times a day.  Move your fingers often to avoid stiffness and to lessen swelling. General instructions  Take over-the-counter and prescription medicines only as told by your health care provider.  If you are taking prescription pain medicine, take actions to prevent or treat constipation. Your health care provider may recommend that you: ? Drink enough fluid to keep your urine pale yellow. ? Eat foods that are high in fiber, such as fresh fruits and vegetables, whole grains, and beans. ? Limit foods that are high in fat and processed sugars, such as fried or sweet foods. ? Take an over-the-counter or prescription medicine for constipation.  Do not use any products that contain nicotine or tobacco, such as cigarettes and e-cigarettes. These can delay incision or bone healing. If you need help quitting, ask your health care provider.  Keep all follow-up visits as told by your health care provider. This is important. Contact a health care provider if you:  Have a fever.  Have severe pain.  Have redness around  an incision.  Have more swelling or pain in an incision area.  Have blood or more fluid coming from an incision.  Notice that an incision feels warm to the touch.  Notice pus or a bad smell coming from an incision.  Notice that an incision has opened up.  Develop a rash. Get help right away if you:  Have difficulty breathing.  Have chest pain.  Notice that your fingers tingle, are numb, or are cold and blue even after you loosen your sling or immobilizer.  Develop pain in your lower leg or at the back of your knee. Summary  If you have a sling or immobilizer, wear it as told by your health care provider. These devices protect your shoulder and help it heal by keeping it in place.  If lying down flat causes shoulder discomfort, it may help to sleep in a sitting position for a few days after your procedure. Try sleeping in a reclining chair, or try propping yourself up with extra pillows in bed.  If physical therapy was prescribed, do exercises as directed. Doing exercises may help to improve shoulder movement and flexibility (range of motion).  Keep all follow-up visits as told by your health care provider. This is important. This information is not intended to replace advice given to you by your health care provider. Make sure you discuss any questions you have with your health care provider. Document Released: 06/20/2014 Document Revised: 11/05/2017 Document Reviewed: 10/08/2017 Elsevier Patient Education  2020 [place] Anesthesia, Adult, Care After  This sheet gives you information about how to care for yourself after your procedure. Your health care provider may also give you more specific instructions. If you have problems or questions, contact your health care provider. What can I expect after the procedure? After the procedure, the following side effects are common:  Pain or discomfort at the IV site.  Nausea.  Vomiting.  Sore throat.  Trouble  concentrating.  Feeling cold or chills.  Weak or tired.  Sleepiness and fatigue.  Soreness and body aches. These side effects can affect parts of the body that were not involved in surgery. Follow these instructions at home:  For at least 24 hours after the procedure:  Have a responsible adult stay with you. It is important to have someone help care for you until you are awake and alert.  Rest as needed.  Do not: ? Participate in activities in which you could fall or become injured. ? Drive. ? Use heavy machinery. ? Drink alcohol. ? Take sleeping pills or medicines that cause drowsiness. ? Make important decisions or sign legal documents. ? Take care of children on your own. Eating and drinking  Follow any instructions from your health care provider about eating or drinking restrictions.  When you feel hungry, start by eating small amounts of foods that are soft and easy to digest (bland), such as toast. Gradually return to your regular diet.  Drink enough fluid to keep your urine pale yellow.  If you vomit, rehydrate by drinking water, juice, or clear broth. General instructions  If you have sleep apnea, surgery and certain medicines can increase your risk for breathing problems. Follow instructions from your health care provider about wearing your sleep device: ? Anytime you are sleeping, including during daytime naps. ? While taking prescription pain medicines, sleeping medicines, or medicines that make you drowsy.  Return to your normal activities as told by your health care provider. Ask your health care provider what activities are safe for you.  Take over-the-counter and prescription medicines only as told by your health care provider.  If you smoke, do not smoke without supervision.  Keep all follow-up visits as told by your health care provider. This is important. Contact a health care provider if:  You have nausea or vomiting that does not get better with  medicine.  You cannot eat or drink without vomiting.  You have pain that does not get better with medicine.  You are unable to pass urine.  You develop a skin rash.  You have a fever.  You have redness around your IV site that gets worse. Get help right away if:  You have difficulty breathing.  You have chest pain.  You have blood in your urine or stool, or you vomit blood. Summary  After the procedure, it is common to have a sore throat or nausea. It is also common to feel tired.  Have a responsible adult stay with you for the first 24 hours after general anesthesia. It is important to have someone help care for you until you are awake and alert.  When you feel hungry, start by eating small amounts of foods that are soft and easy to digest (bland), such as toast. Gradually return to your regular diet.  Drink enough fluid to keep your urine pale yellow.  Return to your normal activities as told by your health care provider. Ask your health care provider what activities are safe for you. This information is not intended to replace advice  given to you by your health care provider. Make sure you discuss any questions you have with your health care provider. Document Released: 03/01/2001 Document Revised: 11/26/2017 Document Reviewed: 07/09/2017 Elsevier Patient Education  2020 [employer]. How to Use Chlorhexidine for Bathing Chlorhexidine gluconate (CHG) is a germ-killing (antiseptic) solution that is used to clean the skin. It can get rid of the bacteria that normally live on the skin and can keep them away for about 24 hours. To clean your skin with CHG, you may be given:  A CHG solution to use in the shower or as part of a sponge bath.  A prepackaged cloth that contains CHG. Cleaning your skin with CHG may help lower the risk for infection:  While you are staying in the intensive care unit of the hospital.  If you have a vascular access, such as a central line, to  provide short-term or long-term access to your veins.  If you have a catheter to drain urine from your bladder.  If you are on a ventilator. A ventilator is a machine that helps you breathe by moving air in and out of your lungs.  After surgery. What are the risks? Risks of using CHG include:  A skin reaction.  Hearing loss, if CHG gets in your ears.  Eye injury, if CHG gets in your eyes and is not rinsed out.  The CHG product catching fire. Make sure that you avoid smoking and flames after applying CHG to your skin. Do not use CHG:  If you have a chlorhexidine allergy or have previously reacted to chlorhexidine.  On babies younger than 85 months of age. How to use CHG solution  Use CHG only as told by your health care provider, and follow the instructions on the label.  Use the full amount of CHG as directed. Usually, this is one bottle. During a shower Follow these steps when using CHG solution during a shower (unless your health care provider gives you different instructions): 1. Start the shower. 2. Use your normal soap and shampoo to wash your face and hair. 3. Turn off the shower or move out of the shower stream. 4. Pour the CHG onto a clean washcloth. Do not use any type of brush or rough-edged sponge. 5. Starting at your neck, lather your body down to your toes. Make sure you follow these instructions: ? If you will be having surgery, pay special attention to the part of your body where you will be having surgery. Scrub this area for at least 1 minute. ? Do not use CHG on your head or face. If the solution gets into your ears or eyes, rinse them well with water. ? Avoid your genital area. ? Avoid any areas of skin that have broken skin, cuts, or scrapes. ? Scrub your back and under your arms. Make sure to wash skin folds. 6. Let the lather sit on your skin for 1-2 minutes or as long as told by your health care provider. 7. Thoroughly rinse your entire body in the  shower. Make sure that all body creases and crevices are rinsed well. 8. Dry off with a clean towel. Do not put any substances on your body afterward-such as powder, lotion, or perfume-unless you are told to do so by your health care provider. Only use lotions that are recommended by the manufacturer. 9. Put on clean clothes or pajamas. 10. If it is the night before your surgery, sleep in clean sheets.  During a sponge bath  Follow these steps when using CHG solution during a sponge bath (unless your health care provider gives you different instructions): 1. Use your normal soap and shampoo to wash your face and hair. 2. Pour the CHG onto a clean washcloth. 3. Starting at your neck, lather your body down to your toes. Make sure you follow these instructions: ? If you will be having surgery, pay special attention to the part of your body where you will be having surgery. Scrub this area for at least 1 minute. ? Do not use CHG on your head or face. If the solution gets into your ears or eyes, rinse them well with water. ? Avoid your genital area. ? Avoid any areas of skin that have broken skin, cuts, or scrapes. ? Scrub your back and under your arms. Make sure to wash skin folds. 4. Let the lather sit on your skin for 1-2 minutes or as long as told by your health care provider. 5. Using a different clean, wet washcloth, thoroughly rinse your entire body. Make sure that all body creases and crevices are rinsed well. 6. Dry off with a clean towel. Do not put any substances on your body afterward-such as powder, lotion, or perfume-unless you are told to do so by your health care provider. Only use lotions that are recommended by the manufacturer. 7. Put on clean clothes or pajamas. 8. If it is the night before your surgery, sleep in clean sheets. How to use CHG prepackaged cloths  Only use CHG cloths as told by your health care provider, and follow the instructions on the label.  Use the CHG cloth  on clean, dry skin.  Do not use the CHG cloth on your head or face unless your health care provider tells you to.  When washing with the CHG cloth: ? Avoid your genital area. ? Avoid any areas of skin that have broken skin, cuts, or scrapes. Before surgery Follow these steps when using a CHG cloth to clean before surgery (unless your health care provider gives you different instructions): 1. Using the CHG cloth, vigorously scrub the part of your body where you will be having surgery. Scrub using a back-and-forth motion for 3 minutes. The area on your body should be completely wet with CHG when you are done scrubbing. 2. Do not rinse. Discard the cloth and let the area air-dry. Do not put any substances on the area afterward, such as powder, lotion, or perfume. 3. Put on clean clothes or pajamas. 4. If it is the night before your surgery, sleep in clean sheets.  For general bathing Follow these steps when using CHG cloths for general bathing (unless your health care provider gives you different instructions). 1. Use a separate CHG cloth for each area of your body. Make sure you wash between any folds of skin and between your fingers and toes. Wash your body in the following order, switching to a new cloth after each step: ? The front of your neck, shoulders, and chest. ? Both of your arms, under your arms, and your hands. ? Your stomach and groin area, avoiding the genitals. ? Your right leg and foot. ? Your left leg and foot. ? The back of your neck, your back, and your buttocks. 2. Do not rinse. Discard the cloth and let the area air-dry. Do not put any substances on your body afterward-such as powder, lotion, or perfume-unless you are told to do so by your health care provider. Only use lotions that  are recommended by the manufacturer. 3. Put on clean clothes or pajamas. Contact a health care provider if:  Your skin gets irritated after scrubbing.  You have questions about using your  solution or cloth. Get help right away if:  Your eyes become very red or swollen.  Your eyes itch badly.  Your skin itches badly and is red or swollen.  Your hearing changes.  You have trouble seeing.  You have swelling or tingling in your mouth or throat.  You have trouble breathing.  You swallow any chlorhexidine. Summary  Chlorhexidine gluconate (CHG) is a germ-killing (antiseptic) solution that is used to clean the skin. Cleaning your skin with CHG may help to lower your risk for infection.  You may be given CHG to use for bathing. It may be in a bottle or in a prepackaged cloth to use on your skin. Carefully follow your health care provider's instructions and the instructions on the product label.  Do not use CHG if you have a chlorhexidine allergy.  Contact your health care provider if your skin gets irritated after scrubbing. This information is not intended to replace advice given to you by your health care provider. Make sure you discuss any questions you have with your health care provider. Document Released: 08/17/2012 Document Revised: 02/09/2019 Document Reviewed: 10/21/2017 Elsevier Patient Education  2020 [employer].

## 2019-08-28 ENCOUNTER — Telehealth: Payer: Self-pay | Admitting: Orthopedic Surgery

## 2019-08-28 DIAGNOSIS — M7521 Bicipital tendinitis, right shoulder: Secondary | ICD-10-CM

## 2019-08-28 NOTE — Telephone Encounter (Signed)
Call received from [facility], ph# [phone], [phone] regarding authorization for patient's shoulder procedure scheduled 08/31/19. Can also email: [email or web address]

## 2019-08-29 ENCOUNTER — Encounter (HOSPITAL_COMMUNITY): Payer: Self-pay

## 2019-08-29 ENCOUNTER — Encounter (HOSPITAL_COMMUNITY)
Admission: RE | Admit: 2019-08-29 | Discharge: 2019-08-29 | Disposition: A | Discharge status: Home / Self Care | Payer: 59 | Source: Ambulatory Visit | Attending: Orthopedic Surgery | Admitting: Orthopedic Surgery

## 2019-08-29 ENCOUNTER — Other Ambulatory Visit: Payer: Self-pay

## 2019-08-29 ENCOUNTER — Other Ambulatory Visit (HOSPITAL_COMMUNITY)
Admission: RE | Admit: 2019-08-29 | Discharge: 2019-08-29 | Disposition: A | Discharge status: Home / Self Care | Payer: 59 | Source: Ambulatory Visit | Attending: Orthopedic Surgery | Admitting: Orthopedic Surgery

## 2019-08-29 DIAGNOSIS — Z01812 Encounter for preprocedural laboratory examination: Secondary | ICD-10-CM | POA: Diagnosis present

## 2019-08-29 LAB — CBC WITH DIFFERENTIAL/PLATELET
Abs Immature Granulocytes: 0.01 10*3/uL (ref 0.00–0.07)
Basophils Absolute: 0 10*3/uL (ref 0.0–0.1)
Basophils Relative: 0 %
Eosinophils Absolute: 0.3 10*3/uL (ref 0.0–0.5)
Eosinophils Relative: 4 %
HCT: 49.5 % (ref 39.0–52.0)
Hemoglobin: 16 g/dL (ref 13.0–17.0)
Immature Granulocytes: 0 %
Lymphocytes Relative: 32 %
Lymphs Abs: 2.4 10*3/uL (ref 0.7–4.0)
MCH: 26.8 pg (ref 26.0–34.0)
MCHC: 32.3 g/dL (ref 30.0–36.0)
MCV: 83.1 fL (ref 80.0–100.0)
Monocytes Absolute: 1 10*3/uL (ref 0.1–1.0)
Monocytes Relative: 13 %
Neutro Abs: 3.9 10*3/uL (ref 1.7–7.7)
Neutrophils Relative %: 51 %
Platelets: 286 10*3/uL (ref 150–400)
RBC: 5.96 MIL/uL — ABNORMAL HIGH (ref 4.22–5.81)
RDW: 13.2 % (ref 11.5–15.5)
WBC: 7.6 10*3/uL (ref 4.0–10.5)
nRBC: 0 % (ref 0.0–0.2)

## 2019-08-29 LAB — BASIC METABOLIC PANEL
Anion gap: 7 (ref 5–15)
BUN: 13 mg/dL (ref 6–20)
CO2: 24 mmol/L (ref 22–32)
Calcium: 9.4 mg/dL (ref 8.9–10.3)
Chloride: 107 mmol/L (ref 98–111)
Creatinine, Ser: 0.58 mg/dL — ABNORMAL LOW (ref 0.61–1.24)
GFR calc Af Amer: >60 mL/min (ref >60)
GFR calc non Af Amer: >60 mL/min (ref >60)
Glucose, Bld: 112 mg/dL — ABNORMAL HIGH (ref 70–99)
Potassium: 4.4 mmol/L (ref 3.5–5.1)
Sodium: 138 mmol/L (ref 135–145)

## 2019-08-29 LAB — SARS CORONAVIRUS 2 (TAT 6-24 HRS): SARS Coronavirus 2: NEGATIVE

## 2019-08-30 ENCOUNTER — Telehealth: Payer: Self-pay | Admitting: Orthopedic Surgery

## 2019-08-30 ENCOUNTER — Telehealth: Payer: Self-pay | Admitting: Radiology

## 2019-08-30 ENCOUNTER — Other Ambulatory Visit: Payer: Self-pay | Admitting: Orthopedic Surgery

## 2019-08-30 ENCOUNTER — Other Ambulatory Visit: Payer: Self-pay | Admitting: Radiology

## 2019-08-30 NOTE — Telephone Encounter (Signed)
Surgery approved by [facility] case number [id number] per Shanon Brow

## 2019-08-30 NOTE — Telephone Encounter (Signed)
I spoke to [place] [facility] about the prior auth, there was an error on the form I submitted and Dr Aline Brochure is listed as the place of service instead of [name], have given them the information for [employer] and the case number will change. [place] [facility] will call me back with updated case number.

## 2019-08-30 NOTE — H&P (Addendum)
Mario Meyer   08/23/2019   HISTORY SECTION :       Chief Complaint  Patient presents with  . Shoulder Pain      right/ getting worse, decreased ROM increased pain    HPI The patient presents for reevaluation of his right shoulder which is getting worse.  He is already had an MRI which showed thinning of her previous rotator cuff repair and mild biceps tendinitis but after injection and oral medication he says his shoulder is worse his motion has gotten worse   Location right shoulder Duration 6 months Quality dull ache with throbbing Severity 10     Review of Systems  Cardiovascular: Positive for chest pain.       Chest pain is been fully evaluated by cardiology found to have no ischemic disease  All other systems reviewed and are negative.      has a past medical history of Panic attack.    Past Surgical History:  Procedure Laterality Date  . COLONOSCOPY N/A 10/04/2015    Procedure: COLONOSCOPY;  Surgeon: Daneil Dolin, MD;  Location: [facility];  Service: Endoscopy;  Laterality: N/A;  7:30 AM  . KNEE ARTHROSCOPY W/ ACL RECONSTRUCTION      . RIGHT/LEFT HEART CATH AND CORONARY ANGIOGRAPHY N/A 02/23/2019    Procedure: RIGHT/LEFT HEART CATH AND CORONARY ANGIOGRAPHY;  Surgeon: Belva Crome, MD;  Location: [facility];  Service: Cardiovascular;  Laterality: N/A;  . rotator cuff surgery           Past Medical History:  Diagnosis Date  . Panic attack     Social History        Tobacco Use  . Smoking status: Never Smoker  . Smokeless tobacco: Never Used  Substance Use Topics  . Alcohol use: No  . Drug use: No        Family History  Problem Relation Age of Onset  . Cancer Mother         Body mass index is 28.37 kg/m.     No Known Allergies    Current Outpatient Medications:  .  aspirin EC 81 MG tablet, Take 1 tablet (81 mg total) by mouth daily., Disp: 90 tablet, Rfl: 3 .  atorvastatin (LIPITOR) 40 MG tablet, Take 1 tablet (40 mg total) by  mouth daily. (Patient not taking: Reported on 02/22/2019), Disp: 90 tablet, Rfl: 3 .  budesonide-formoterol (SYMBICORT) 80-4.5 MCG/ACT inhaler, Inhale 2 puffs into the lungs 2 (two) times daily. (Patient not taking: Reported on 06/12/2019), Disp: 1 Inhaler, Rfl: 3 .  meloxicam (MOBIC) 7.5 MG tablet, Take 1 tablet (7.5 mg total) by mouth daily. (Patient not taking: Reported on 08/23/2019), Disp: 30 tablet, Rfl: 5 .  metoprolol tartrate (LOPRESSOR) 25 MG tablet, Take 1 tablet (25 mg total) by mouth 2 (two) times daily. (Patient not taking: Reported on 02/22/2019), Disp: 180 tablet, Rfl: 3     PHYSICAL EXAM SECTION: 1) BP 128/72   Pulse 68   Ht 6\' 1"  (1.854 m)   Wt 215 lb (97.5 kg)   BMI 28.37 kg/m   Body mass index is 28.37 kg/m. General appearance: Well-developed well-nourished no gross deformities  2) Cardiovascular normal pulse and perfusion in the upper extremities normal color without edema  3) Neurologically deep tendon reflexes are equal and normal, no sensation loss or deficits no pathologic reflexes   4) Psychological: Awake alert and oriented x3 mood and affect normal   5) Skin no lacerations or  ulcerations no nodularity no palpable masses, no erythema or nodularity   6) Musculoskeletal:    Left shoulder is nontender no swelling full range of motion no weakness   Right shoulder most of the tenderness is in the front of the shoulder and rotator interval in the biceps muscle and tendon He has notable decrease external rotation compared to the left shoulder has painful abduction from 0 to 90 degrees Passive flexion is 150 degrees active flexion is 90 degrees with pain.  We do feel that the supraspinatus has weakness     MEDICAL DECISION SECTION:      Encounter Diagnoses  Name Primary?  . Chronic right shoulder pain Yes  . Biceps tendinitis of right upper extremity    . Status post right rotator cuff repair       Imaging Previous MRI IMPRESSION: 1. Mild thinning of the  supraspinatus and infraspinatus tendons likely due to mild tendinopathy and potentially some mild partial thickness articular surface tearing. No full-thickness tear is Identified.   2. Metal artifact likely from microscopic metallic particles along the rotator interval and anterior supraspinatus region.   3. Mild biceps tendinopathy.   4. Mild degenerative chondral thinning in the glenohumeral joint. Mild degenerative spurring in the [facility] joint.     Electronically Signed   By: Van Clines M.D.   On: 06/19/2019 17:32     Plan:  (Rx., Inj., surg., Frx, MRI/CT, XR:2)   After much discussion the patient says he wants something done he cannot use his arm.  So we are going to proceed with    arthroscopy right shoulder may or may not have to repair his rotator cuff he is aware and then do a biceps tenodesis with open technique   The procedure has been fully reviewed with the patient; The risks and benefits of surgery have been discussed and explained and understood. Alternative treatment has also been reviewed, questions were encouraged and answered. The postoperative plan is also been reviewed.     5:20 PM Arther Abbott, MD   08/23/2019

## 2019-08-30 NOTE — Telephone Encounter (Signed)
Faxed requested information/authorization/release of information on file - [employer] Attn: Charlotte Crumb, fax# [phone], verification of scheduled surgery.

## 2019-08-31 ENCOUNTER — Ambulatory Visit (HOSPITAL_COMMUNITY)
Admission: RE | Admit: 2019-08-31 | Discharge: 2019-08-31 | Disposition: A | Discharge status: Home / Self Care | Payer: 59 | Attending: Orthopedic Surgery | Admitting: Orthopedic Surgery

## 2019-08-31 ENCOUNTER — Encounter (HOSPITAL_COMMUNITY): Payer: Self-pay | Admitting: *Deleted

## 2019-08-31 ENCOUNTER — Encounter (HOSPITAL_COMMUNITY)
Admission: RE | Disposition: A | Discharge status: Home / Self Care | Payer: Self-pay | Source: Home / Self Care | Attending: Orthopedic Surgery

## 2019-08-31 ENCOUNTER — Other Ambulatory Visit: Payer: Self-pay

## 2019-08-31 ENCOUNTER — Ambulatory Visit (HOSPITAL_COMMUNITY): Payer: 59 | Admitting: Anesthesiology

## 2019-08-31 DIAGNOSIS — F419 Anxiety disorder, unspecified: Secondary | ICD-10-CM | POA: Diagnosis not present

## 2019-08-31 DIAGNOSIS — I209 Angina pectoris, unspecified: Secondary | ICD-10-CM | POA: Diagnosis not present

## 2019-08-31 DIAGNOSIS — R0609 Other forms of dyspnea: Secondary | ICD-10-CM | POA: Insufficient documentation

## 2019-08-31 DIAGNOSIS — Z7982 Long term (current) use of aspirin: Secondary | ICD-10-CM | POA: Insufficient documentation

## 2019-08-31 DIAGNOSIS — Z79899 Other long term (current) drug therapy: Secondary | ICD-10-CM | POA: Diagnosis not present

## 2019-08-31 DIAGNOSIS — Z809 Family history of malignant neoplasm, unspecified: Secondary | ICD-10-CM | POA: Insufficient documentation

## 2019-08-31 DIAGNOSIS — M7521 Bicipital tendinitis, right shoulder: Secondary | ICD-10-CM | POA: Diagnosis present

## 2019-08-31 DIAGNOSIS — Z7951 Long term (current) use of inhaled steroids: Secondary | ICD-10-CM | POA: Diagnosis not present

## 2019-08-31 DIAGNOSIS — Z791 Long term (current) use of non-steroidal anti-inflammatories (NSAID): Secondary | ICD-10-CM | POA: Diagnosis not present

## 2019-08-31 DIAGNOSIS — M75111 Incomplete rotator cuff tear or rupture of right shoulder, not specified as traumatic: Secondary | ICD-10-CM | POA: Diagnosis not present

## 2019-08-31 HISTORY — PX: SHOULDER ARTHROSCOPY WITH OPEN ROTATOR CUFF REPAIR: SHX6092

## 2019-08-31 SURGERY — ARTHROSCOPY, SHOULDER WITH REPAIR, ROTATOR CUFF, OPEN
Anesthesia: Regional | Site: Shoulder | Laterality: Right

## 2019-08-31 MED ORDER — CEFAZOLIN SODIUM-DEXTROSE 2-4 GM/100ML-% IV SOLN: 2.0000 g | INTRAVENOUS | Status: DC

## 2019-08-31 MED ORDER — PROMETHAZINE HCL 12.5 MG PO TABS: 12.5000 mg | ORAL_TABLET | Freq: Four times a day (QID) | ORAL | 0 refills | Status: DC | PRN

## 2019-08-31 MED ORDER — PROPOFOL 10 MG/ML IV BOLUS
INTRAVENOUS | Status: AC
  Filled 2019-08-31: qty 40

## 2019-08-31 MED ORDER — HYDROMORPHONE HCL 1 MG/ML IJ SOLN: 0.2500 mg | INTRAMUSCULAR | Status: DC | PRN

## 2019-08-31 MED ORDER — METHOCARBAMOL 1000 MG/10ML IJ SOLN
500.0000 mg | Freq: Once | INTRAVENOUS | Status: AC
  Administered 2019-08-31: 14:00:00 500 mg via INTRAVENOUS
  Filled 2019-08-31: qty 5

## 2019-08-31 MED ORDER — BUPIVACAINE-EPINEPHRINE (PF) 0.5% -1:200000 IJ SOLN
INTRAMUSCULAR | Status: AC
  Filled 2019-08-31: qty 60

## 2019-08-31 MED ORDER — DEXAMETHASONE SODIUM PHOSPHATE 4 MG/ML IJ SOLN
INTRAMUSCULAR | Status: DC | PRN
  Administered 2019-08-31: 8 mg via INTRAVENOUS

## 2019-08-31 MED ORDER — HYDROCODONE-ACETAMINOPHEN 10-325 MG PO TABS: 1.0000 | ORAL_TABLET | ORAL | 0 refills | Status: DC | PRN

## 2019-08-31 MED ORDER — ROCURONIUM BROMIDE 10 MG/ML (PF) SYRINGE
PREFILLED_SYRINGE | INTRAVENOUS | Status: AC
  Filled 2019-08-31: qty 10

## 2019-08-31 MED ORDER — PREGABALIN 50 MG PO CAPS
ORAL_CAPSULE | ORAL | Status: AC
  Filled 2019-08-31: qty 1

## 2019-08-31 MED ORDER — ONDANSETRON HCL 4 MG/2ML IJ SOLN
INTRAMUSCULAR | Status: AC
  Filled 2019-08-31: qty 2

## 2019-08-31 MED ORDER — BUPIVACAINE-EPINEPHRINE 0.5% -1:200000 IJ SOLN
INTRAMUSCULAR | Status: DC | PRN
  Administered 2019-08-31: 30 mL

## 2019-08-31 MED ORDER — SUCCINYLCHOLINE CHLORIDE 200 MG/10ML IV SOSY
PREFILLED_SYRINGE | INTRAVENOUS | Status: AC
  Filled 2019-08-31: qty 20

## 2019-08-31 MED ORDER — EPHEDRINE 5 MG/ML INJ
INTRAVENOUS | Status: AC
  Filled 2019-08-31: qty 10

## 2019-08-31 MED ORDER — PHENYLEPHRINE 40 MCG/ML (10ML) SYRINGE FOR IV PUSH (FOR BLOOD PRESSURE SUPPORT)
PREFILLED_SYRINGE | INTRAVENOUS | Status: AC
  Filled 2019-08-31: qty 20

## 2019-08-31 MED ORDER — HYDROCODONE-ACETAMINOPHEN 7.5-325 MG PO TABS: 1.0000 | ORAL_TABLET | Freq: Once | ORAL | Status: DC

## 2019-08-31 MED ORDER — SUGAMMADEX SODIUM 200 MG/2ML IV SOLN
INTRAVENOUS | Status: DC | PRN
  Administered 2019-08-31: 200 mg via INTRAVENOUS

## 2019-08-31 MED ORDER — CELECOXIB 400 MG PO CAPS
ORAL_CAPSULE | ORAL | Status: AC
  Filled 2019-08-31: qty 1

## 2019-08-31 MED ORDER — MIDAZOLAM HCL 2 MG/2ML IJ SOLN: 0.5000 mg | Freq: Once | INTRAMUSCULAR | Status: DC | PRN

## 2019-08-31 MED ORDER — PROPOFOL 10 MG/ML IV BOLUS
INTRAVENOUS | Status: DC | PRN
  Administered 2019-08-31: 200 mg via INTRAVENOUS

## 2019-08-31 MED ORDER — LIDOCAINE 2% (20 MG/ML) 5 ML SYRINGE
INTRAMUSCULAR | Status: AC
  Filled 2019-08-31: qty 5

## 2019-08-31 MED ORDER — LIDOCAINE HCL (CARDIAC) PF 100 MG/5ML IV SOSY
PREFILLED_SYRINGE | INTRAVENOUS | Status: DC | PRN
  Administered 2019-08-31: 40 mg via INTRAVENOUS

## 2019-08-31 MED ORDER — LACTATED RINGERS IV SOLN
INTRAVENOUS | Status: DC
  Administered 2019-08-31 (×2): via INTRAVENOUS

## 2019-08-31 MED ORDER — TIZANIDINE HCL 4 MG PO TABS: 4.0000 mg | ORAL_TABLET | Freq: Three times a day (TID) | ORAL | 0 refills | Status: AC

## 2019-08-31 MED ORDER — FENTANYL CITRATE (PF) 100 MCG/2ML IJ SOLN
INTRAMUSCULAR | Status: DC | PRN
  Administered 2019-08-31: 75 ug via INTRAVENOUS
  Administered 2019-08-31: 25 ug via INTRAVENOUS
  Administered 2019-08-31: 50 ug via INTRAVENOUS

## 2019-08-31 MED ORDER — PROMETHAZINE HCL 25 MG/ML IJ SOLN: 6.2500 mg | INTRAMUSCULAR | Status: DC | PRN

## 2019-08-31 MED ORDER — GLYCOPYRROLATE PF 0.2 MG/ML IJ SOSY
PREFILLED_SYRINGE | INTRAMUSCULAR | Status: AC
  Filled 2019-08-31: qty 1

## 2019-08-31 MED ORDER — ROCURONIUM BROMIDE 100 MG/10ML IV SOLN
INTRAVENOUS | Status: DC | PRN
  Administered 2019-08-31 (×2): 10 mg via INTRAVENOUS
  Administered 2019-08-31: 30 mg via INTRAVENOUS
  Administered 2019-08-31: 10 mg via INTRAVENOUS

## 2019-08-31 MED ORDER — FENTANYL CITRATE (PF) 100 MCG/2ML IJ SOLN
INTRAMUSCULAR | Status: AC
  Filled 2019-08-31: qty 4

## 2019-08-31 MED ORDER — CHLORHEXIDINE GLUCONATE 4 % EX LIQD: 60.0000 mL | Freq: Once | CUTANEOUS | Status: DC

## 2019-08-31 MED ORDER — SODIUM CHLORIDE 0.9 % IR SOLN
Status: DC | PRN
  Administered 2019-08-31: 1000 mL

## 2019-08-31 MED ORDER — MIDAZOLAM HCL 2 MG/2ML IJ SOLN
INTRAMUSCULAR | Status: AC
  Filled 2019-08-31: qty 2

## 2019-08-31 MED ORDER — PHENYLEPHRINE HCL (PRESSORS) 10 MG/ML IV SOLN
INTRAVENOUS | Status: DC | PRN
  Administered 2019-08-31: 80 ug via INTRAVENOUS

## 2019-08-31 MED ORDER — EPINEPHRINE PF 1 MG/ML IJ SOLN
INTRAMUSCULAR | Status: AC
  Filled 2019-08-31: qty 10

## 2019-08-31 MED ORDER — ONDANSETRON HCL 4 MG/2ML IJ SOLN: 4.0000 mg | Freq: Once | INTRAMUSCULAR | Status: DC

## 2019-08-31 MED ORDER — ROPIVACAINE HCL 5 MG/ML IJ SOLN
INTRAMUSCULAR | Status: DC | PRN
  Administered 2019-08-31: 30 mg via PERINEURAL

## 2019-08-31 MED ORDER — DEXAMETHASONE SODIUM PHOSPHATE 10 MG/ML IJ SOLN
INTRAMUSCULAR | Status: AC
  Filled 2019-08-31: qty 1

## 2019-08-31 MED ORDER — CELECOXIB 400 MG PO CAPS
400.0000 mg | ORAL_CAPSULE | Freq: Once | ORAL | Status: AC
  Administered 2019-08-31: 400 mg via ORAL

## 2019-08-31 MED ORDER — MIDAZOLAM HCL 2 MG/2ML IJ SOLN
INTRAMUSCULAR | Status: AC
  Filled 2019-08-31: qty 4

## 2019-08-31 MED ORDER — PREGABALIN 50 MG PO CAPS
50.0000 mg | ORAL_CAPSULE | Freq: Once | ORAL | Status: AC
  Administered 2019-08-31: 13:00:00 50 mg via ORAL

## 2019-08-31 MED ORDER — MIDAZOLAM HCL 2 MG/2ML IJ SOLN
INTRAMUSCULAR | Status: DC | PRN
  Administered 2019-08-31: 2 mg via INTRAVENOUS

## 2019-08-31 MED ORDER — HYDROCODONE-ACETAMINOPHEN 7.5-325 MG PO TABS: 1.0000 | ORAL_TABLET | Freq: Once | ORAL | Status: DC | PRN

## 2019-08-31 MED ORDER — ROPIVACAINE HCL 5 MG/ML IJ SOLN
INTRAMUSCULAR | Status: AC
  Filled 2019-08-31: qty 30

## 2019-08-31 MED ORDER — SODIUM CHLORIDE 0.9 % IR SOLN
Status: DC | PRN
  Administered 2019-08-31 (×3): 3000 mL

## 2019-08-31 MED ORDER — SUCCINYLCHOLINE CHLORIDE 200 MG/10ML IV SOSY
PREFILLED_SYRINGE | INTRAVENOUS | Status: DC | PRN
  Administered 2019-08-31: 120 mg via INTRAVENOUS

## 2019-08-31 MED ORDER — CEFAZOLIN SODIUM-DEXTROSE 2-4 GM/100ML-% IV SOLN
2.0000 g | INTRAVENOUS | Status: AC
  Administered 2019-08-31: 11:00:00 2 g via INTRAVENOUS
  Filled 2019-08-31: qty 100

## 2019-08-31 MED ORDER — ONDANSETRON HCL 4 MG/2ML IJ SOLN
INTRAMUSCULAR | Status: DC | PRN
  Administered 2019-08-31: 4 mg via INTRAVENOUS

## 2019-08-31 MED ORDER — FENTANYL CITRATE (PF) 250 MCG/5ML IJ SOLN
INTRAMUSCULAR | Status: AC
  Filled 2019-08-31: qty 5

## 2019-08-31 SURGICAL SUPPLY — 86 items
ANCH SUT SWLK 19.1X4.75 (Anchor) ×4 IMPLANT
ANCHOR SUT BIO SW 4.75X19.1 (Anchor) ×8 IMPLANT
APL PRP STRL LF DISP 70% ISPRP (MISCELLANEOUS) ×1
APL SKNCLS STERI-STRIP NONHPOA (GAUZE/BANDAGES/DRESSINGS) ×1
BENZOIN TINCTURE PRP APPL 2/3 (GAUZE/BANDAGES/DRESSINGS) ×2 IMPLANT
BLADE 10 SAFETY STRL DISP (BLADE) ×2 IMPLANT
BLADE 11 SAFETY STRL DISP (BLADE) ×3 IMPLANT
BLADE AGGRESSIVE PLUS 4.0 (BLADE) ×3 IMPLANT
BNDG COHESIVE 4X5 TAN STRL (GAUZE/BANDAGES/DRESSINGS) ×3 IMPLANT
BUR FAST CUTTING (BURR) ×3
BUR SRGRND 54.5X3.2X8 (BURR) IMPLANT
BURR SRGRND 54.5X3.2X8 (BURR) ×1
CHLORAPREP W/TINT 26 (MISCELLANEOUS) ×3 IMPLANT
CLOSURE STERI-STRIP 1/2X4 (GAUZE/BANDAGES/DRESSINGS) ×2
CLOSURE WOUND 1/2 X4 (GAUZE/BANDAGES/DRESSINGS) ×1
CLOTH BEACON ORANGE TIMEOUT ST (SAFETY) ×3 IMPLANT
CLSR STERI-STRIP ANTIMIC 1/2X4 (GAUZE/BANDAGES/DRESSINGS) ×2 IMPLANT
COVER LIGHT HANDLE STERIS (MISCELLANEOUS) ×6 IMPLANT
COVER MAYO STAND XLG (MISCELLANEOUS) ×3 IMPLANT
COVER WAND RF STERILE (DRAPES) ×2 IMPLANT
DECANTER SPIKE VIAL GLASS SM (MISCELLANEOUS) ×3 IMPLANT
DRAPE HALF SHEET 40X57 (DRAPES) ×2 IMPLANT
DRAPE SHOULDER BEACH CHAIR (DRAPES) ×3 IMPLANT
DRAPE U-SHAPE 47X51 STRL (DRAPES) ×3 IMPLANT
DRESSING ALLEVYN BORDER HEEL (GAUZE/BANDAGES/DRESSINGS) ×6 IMPLANT
DRESSING MEPILEX BORDER 6X8 (GAUZE/BANDAGES/DRESSINGS) IMPLANT
DRSG MEPILEX BORDER 6X8 (GAUZE/BANDAGES/DRESSINGS) ×3
DRSG TEGADERM 4X4.75 (GAUZE/BANDAGES/DRESSINGS) ×2 IMPLANT
ELECT CAUTERY BLADE 6.4 (BLADE) ×2 IMPLANT
ELECT REM PT RETURN 9FT ADLT (ELECTROSURGICAL) ×3
ELECTRODE REM PT RTRN 9FT ADLT (ELECTROSURGICAL) ×1 IMPLANT
GAUZE 4X4 16PLY RFD (DISPOSABLE) ×3 IMPLANT
GAUZE SPONGE 4X4 12PLY STRL (GAUZE/BANDAGES/DRESSINGS) ×2 IMPLANT
GAUZE XEROFORM 5X9 LF (GAUZE/BANDAGES/DRESSINGS) ×2 IMPLANT
GLOVE BIOGEL PI IND STRL 7.0 (GLOVE) ×2 IMPLANT
GLOVE BIOGEL PI INDICATOR 7.0 (GLOVE) ×8
GLOVE ECLIPSE 6.5 STRL STRAW (GLOVE) ×4 IMPLANT
GLOVE SKINSENSE NS SZ8.0 LF (GLOVE) ×2
GLOVE SKINSENSE STRL SZ8.0 LF (GLOVE) ×1 IMPLANT
GLOVE SS N UNI LF 8.5 STRL (GLOVE) ×3 IMPLANT
GOWN STRL REUS W/TWL LRG LVL3 (GOWN DISPOSABLE) ×7 IMPLANT
GOWN STRL REUS W/TWL XL LVL3 (GOWN DISPOSABLE) ×3 IMPLANT
INST SET MINOR BONE (KITS) ×2 IMPLANT
IV NS IRRIG 3000ML ARTHROMATIC (IV SOLUTION) ×8 IMPLANT
KIT BLADEGUARD II DBL (SET/KITS/TRAYS/PACK) ×3 IMPLANT
KIT POSITION SHOULDER SCHLEI (MISCELLANEOUS) ×3 IMPLANT
KIT TURNOVER KIT A (KITS) ×3 IMPLANT
MANIFOLD NEPTUNE II (INSTRUMENTS) ×3 IMPLANT
MARKER SKIN DUAL TIP RULER LAB (MISCELLANEOUS) ×3 IMPLANT
NDL HYPO 18GX1.5 BLUNT FILL (NEEDLE) ×1 IMPLANT
NDL HYPO 21X1.5 SAFETY (NEEDLE) ×1 IMPLANT
NDL MAYO 6 CRC TAPER PT (NEEDLE) IMPLANT
NDL SCORPION MULTI FIRE (NEEDLE) IMPLANT
NDL SPNL 18GX3.5 QUINCKE PK (NEEDLE) ×1 IMPLANT
NEEDLE HYPO 18GX1.5 BLUNT FILL (NEEDLE) ×3 IMPLANT
NEEDLE HYPO 21X1.5 SAFETY (NEEDLE) ×6 IMPLANT
NEEDLE MAYO 6 CRC TAPER PT (NEEDLE) ×3 IMPLANT
NEEDLE SCORPION MULTI FIRE (NEEDLE) ×3 IMPLANT
NEEDLE SPNL 18GX3.5 QUINCKE PK (NEEDLE) ×3 IMPLANT
NS IRRIG 1000ML POUR BTL (IV SOLUTION) ×3 IMPLANT
PACK BASIC III (CUSTOM PROCEDURE TRAY) ×3
PACK SRG BSC III STRL LF ECLPS (CUSTOM PROCEDURE TRAY) ×1 IMPLANT
PAD ABD 5X9 TENDERSORB (GAUZE/BANDAGES/DRESSINGS) ×4 IMPLANT
PAD ARMBOARD 7.5X6 YLW CONV (MISCELLANEOUS) ×3 IMPLANT
PENCIL HANDSWITCHING (ELECTRODE) ×3 IMPLANT
PROBE BIPOLAR ATHRO 135MM 90D (MISCELLANEOUS) ×2 IMPLANT
SET ARTHROSCOPY INST (INSTRUMENTS) ×3 IMPLANT
SET BASIN LINEN APH (SET/KITS/TRAYS/PACK) ×3 IMPLANT
SET TUBE SUCT SHAVER OUTFL 24K (TUBING) ×3 IMPLANT
SLING ARM FOAM STRAP XLG (SOFTGOODS) ×2 IMPLANT
SPONGE GAUZE 2X2 8PLY STER LF (GAUZE/BANDAGES/DRESSINGS) ×1
SPONGE GAUZE 2X2 8PLY STRL LF (GAUZE/BANDAGES/DRESSINGS) ×1 IMPLANT
STOCKINETTE IMPERVIOUS LG (DRAPES) ×3 IMPLANT
STRIP CLOSURE SKIN 1/2X4 (GAUZE/BANDAGES/DRESSINGS) ×1 IMPLANT
SUT ETHIBOND NAB OS 4 #2 30IN (SUTURE) ×6 IMPLANT
SUT MON AB 0 CT1 (SUTURE) ×4 IMPLANT
SUT MON AB 2-0 CT1 36 (SUTURE) ×2 IMPLANT
SUT TIGER TAPE 7 IN WHITE (SUTURE) ×2 IMPLANT
SYR 10ML LL (SYRINGE) ×2 IMPLANT
SYR 30ML LL (SYRINGE) ×3 IMPLANT
SYR BULB IRRIGATION 50ML (SYRINGE) ×6 IMPLANT
SYR CONTROL 10ML LL (SYRINGE) ×3 IMPLANT
TAPE FIBER 2MM 7IN #2 BLUE (SUTURE) ×4 IMPLANT
TOWEL OR 17X26 4PK STRL BLUE (TOWEL DISPOSABLE) ×3 IMPLANT
TUBING ARTHRO INFLOW-ONLY STRL (TUBING) ×2 IMPLANT
YANKAUER SUCT BULB TIP 10FT TU (MISCELLANEOUS) ×6 IMPLANT

## 2019-08-31 NOTE — Op Note (Signed)
08/31/2019  1:03 PM  PATIENT:  Mario Meyer  58 y.o. male  PRE-OPERATIVE DIAGNOSIS:  right shoulder biceps tendonitis  POST-OPERATIVE DIAGNOSIS: Partial articular surface tendon avulsion rotator cuff right shoulder with right shoulder biceps tendonitis  PROCEDURE:  Procedure(s): RIGHT SHOULDER ARTHROSCOPY with open rotator cuff repair (Right)  Findings mild tendinitis of the biceps tendon with degenerative tearing in the bicipital groove the intra-articular portion was normal there was mild fraying of the anterior labrum mild degeneration of the superior surface of the subscapularis and a PASTA lesion of the supraspinatus  Subacromial space severe bursitis  SURGEON:  Surgeon(s) and Role:    Carole Civil, MD - Primary  We did the surgery as follows.  The patient was seen in the preop area was interviewed examined chart review was completed identity was confirmed surgical site confirmed marked right shoulder.  Interscalene block performed by anesthesia  Patient taken to surgery Ancef given for IV antibiotics general anesthesia.  The patient was placed in a modified beachchair position but Schlein positioner The arm was prepped and draped sterilely Timeout was completed  Diagnostic arthroscopy was performed through a posterior portal and anterior portal was established in the biceps tendon was brought into the joint and found to have significant inflammation there was a portion right at the edge of the bicipital groove which had some degenerative tearing.  This was debrided a second portal was established and a grasper was placed to pull the tendon into the joint and it was debrided through this first portal  We then debrided the anterior labrum.  We thoroughly examined the rotator cuff found to have a partial articular surface tendon avulsion  We then placed the scope in the subacromial space and did a bursectomy placed a spinal needle in the tear and then made an incision  over the spinal needle through open technique  We divided the skin subcutaneous tissue and separated the subcutaneous tissue from the underlying deltoid fascia.  We divided the median raphae up to the acromion and removed 1cm of the deltoid from the acromion and the clavicle and performed a bursectomy which was thickened.  We found the rent in the rotator interval and marked the anterior portion with a suture.  The bubbling of the tendon was noted from the outside.  We remove the tendon remaining fibers from the greater tuberosity and tagged it with sutures.  We debrided the greater tuberosity with a bur until the bone was bleeding we then placed 2 suture anchors from the Arthrex swivel lock set with fiber tape.  We then placed 2 more anchors using swivel locks laterally to give an excellent reapproximation of the entire tendon back to the entire greater tuberosity  After irrigation the deltoid was closed with #2 Ethibond suture running suture.  Subcu closed with 0 Monocryl and then 2-0 Monocryl with Steri-Strips using benzoin for reapproximation.  The portals were closed with 2-0 Monocryl as well.  Sterile dressing was applied  Patient extubated taken recovery room in stable condition  PHYSICIAN ASSISTANT:   ASSISTANTS: DEBBIE DALLAS    ANESTHESIA:   general and paracervical block  EBL:  30 mL   BLOOD ADMINISTERED:none  DRAINS: none   LOCAL MEDICATIONS USED:  MARCAINE     SPECIMEN:  No Specimen  DISPOSITION OF SPECIMEN:  N/A  COUNTS:  YES  TOURNIQUET:  * No tourniquets in log *  DICTATION: .Dragon Dictation  PLAN OF CARE: Discharge to home after PACU  PATIENT DISPOSITION:  PACU - hemodynamically stable.   Delay start of Pharmacological VTE agent (>24hrs) due to surgical blood loss or risk of bleeding: NA

## 2019-08-31 NOTE — Anesthesia Postprocedure Evaluation (Signed)
Anesthesia Post Note  Patient: Mario Meyer  Procedure(s) Performed: RIGHT SHOULDER ARTHROSCOPY with open rotator cuff repair (Right Shoulder)  Patient location during evaluation: PACU Anesthesia Type: Regional Level of consciousness: awake and alert and oriented Pain management: pain level controlled Vital Signs Assessment: post-procedure vital signs reviewed and stable Respiratory status: spontaneous breathing Cardiovascular status: stable Anesthetic complications: no     Last Vitals:  Vitals:   08/31/19 1020 08/31/19 1315  BP: (!) 152/83 (!) 151/65  Pulse:  93  Resp:  18  Temp:  36.6 C  SpO2:  98%    Last Pain:  Vitals:   08/31/19 1330  TempSrc:   PainSc: 0-No pain                 ADAMS, AMY A

## 2019-08-31 NOTE — Interval H&P Note (Signed)
History and Physical Interval Note:  08/31/2019 10:31 AM  Mario Meyer  has presented today for surgery, with the diagnosis of right shoulder biceps tendonitis.  The various methods of treatment have been discussed with the patient and family. After consideration of risks, benefits and other options for treatment, the patient has consented to  Procedure(s): RIGHT SHOULDER ARTHROSCOPY (Right) RIGHT BICEPS TENODESIS (Right) as a surgical intervention.  The patient's history has been reviewed, patient examined, no change in status, stable for surgery.  I have reviewed the patient's chart and labs.  Questions were answered to the patient's satisfaction.     Arther Abbott

## 2019-08-31 NOTE — Transfer of Care (Signed)
Immediate Anesthesia Transfer of Care Note  Patient: Mario Meyer  Procedure(s) Performed: RIGHT SHOULDER ARTHROSCOPY with open rotator cuff repair (Right Shoulder)  Patient Location: PACU  Anesthesia Type:General  Level of Consciousness: awake and alert   Airway & Oxygen Therapy: Patient Spontanous Breathing  Post-op Assessment: Report given to RN  Post vital signs: Reviewed and stable  Last Vitals:  Vitals Value Taken Time  BP 151/65 08/31/19 1315  Temp    Pulse 89 08/31/19 1319  Resp 18 08/31/19 1319  SpO2 100 % 08/31/19 1319  Vitals shown include unvalidated device data.  Last Pain:  Vitals:   08/31/19 1315  TempSrc:   PainSc: (P) 0-No pain      Patients Stated Pain Goal: (P) 7 ([id number])  Complications: No apparent anesthesia complications

## 2019-08-31 NOTE — Anesthesia Procedure Notes (Signed)
Anesthesia Regional Block: Interscalene brachial plexus block   Pre-Anesthetic Checklist: ,, timeout performed, Correct Patient, Correct Site, Correct Laterality, Correct Procedure, Correct Position, site marked, Risks and benefits discussed,  Surgical consent,  Pre-op evaluation,  At surgeon's request and post-op pain management  Laterality: Right  Prep: chloraprep       Needles:  Injection technique: Single-shot  Needle Type: Stimulator Needle - 40     Needle Length: 4cm  Needle Gauge: 22     Additional Needles:   Procedures:,,,, ultrasound used (permanent image in chart),,,,  Narrative:  Start time: 08/31/2019 8:43 AM End time: 08/31/2019 8:54 AM Injection made incrementally with aspirations every 5 mL.  Performed by: Personally  Anesthesiologist: Lenice Llamas, MD  Additional Notes: + consent, Aseptic, Sterile glove , TO preformed  BP ID'D with [place] in between the two scalene muscles  30cc of 0.5 % Naropin in 3-5 cc increments -No POI-awake and conversant throughout  Aided by Angie RN with Block in preop area  Tolerated well   Block assessed prior to start of surgery

## 2019-08-31 NOTE — Brief Op Note (Signed)
08/31/2019  1:03 PM  PATIENT:  Mario Meyer  58 y.o. male  PRE-OPERATIVE DIAGNOSIS:  right shoulder biceps tendonitis  POST-OPERATIVE DIAGNOSIS: Partial articular surface tendon avulsion rotator cuff right shoulder with right shoulder biceps tendonitis  PROCEDURE:  Procedure(s): RIGHT SHOULDER ARTHROSCOPY with open rotator cuff repair (Right)  Findings mild tendinitis of the biceps tendon with degenerative tearing in the bicipital groove the intra-articular portion was normal there was mild fraying of the anterior labrum mild degeneration of the superior surface of the subscapularis and a PASTA lesion of the supraspinatus  Subacromial space severe bursitis  SURGEON:  Surgeon(s) and Role:    Carole Civil, MD - Primary  We did the surgery as follows.  The patient was seen in the preop area was interviewed examined chart review was completed identity was confirmed surgical site confirmed marked right shoulder.  Interscalene block performed by anesthesia  Patient taken to surgery Ancef given for IV antibiotics general anesthesia.  The patient was placed in a modified beachchair position but Schlein positioner The arm was prepped and draped sterilely Timeout was completed  Diagnostic arthroscopy was performed through a posterior portal and anterior portal was established in the biceps tendon was brought into the joint and found to have significant inflammation there was a portion right at the edge of the bicipital groove which had some degenerative tearing.  This was debrided a second portal was established and a grasper was placed to pull the tendon into the joint and it was debrided through this first portal  We then debrided the anterior labrum.  We thoroughly examined the rotator cuff found to have a partial articular surface tendon avulsion  We then placed the scope in the subacromial space and did a bursectomy placed a spinal needle in the tear and then made an incision  over the spinal needle through open technique  We divided the skin subcutaneous tissue and separated the subcutaneous tissue from the underlying deltoid fascia.  We divided the median raphae up to the acromion and removed 1cm of the deltoid from the acromion and the clavicle and performed a bursectomy which was thickened.  We found the rent in the rotator interval and marked the anterior portion with a suture.  The bubbling of the tendon was noted from the outside.  We remove the tendon remaining fibers from the greater tuberosity and tagged it with sutures.  We debrided the greater tuberosity with a bur until the bone was bleeding we then placed 2 suture anchors from the Arthrex swivel lock set with fiber tape.  We then placed 2 more anchors using swivel locks laterally to give an excellent reapproximation of the entire tendon back to the entire greater tuberosity  After irrigation the deltoid was closed with #2 Ethibond suture running suture.  Subcu closed with 0 Monocryl and then 2-0 Monocryl with Steri-Strips using benzoin for reapproximation.  The portals were closed with 2-0 Monocryl as well.  Sterile dressing was applied  Patient extubated taken recovery room in stable condition  PHYSICIAN ASSISTANT:   ASSISTANTS: DEBBIE DALLAS    ANESTHESIA:   general and paracervical block  EBL:  30 mL   BLOOD ADMINISTERED:none  DRAINS: none   LOCAL MEDICATIONS USED:  MARCAINE     SPECIMEN:  No Specimen  DISPOSITION OF SPECIMEN:  N/A  COUNTS:  YES  TOURNIQUET:  * No tourniquets in log *  DICTATION: .Dragon Dictation  PLAN OF CARE: Discharge to home after PACU  PATIENT DISPOSITION:  PACU - hemodynamically stable.   Delay start of Pharmacological VTE agent (>24hrs) due to surgical blood loss or risk of bleeding: NA

## 2019-08-31 NOTE — Anesthesia Procedure Notes (Signed)
Procedure Name: Intubation Date/Time: 08/31/2019 10:52 AM Performed by: Georgeanne Nim, CRNA Pre-anesthesia Checklist: Patient identified, Emergency Drugs available, Suction available, Patient being monitored and Timeout performed Patient Re-evaluated:Patient Re-evaluated prior to induction Oxygen Delivery Method: Circle system utilized Preoxygenation: Pre-oxygenation with 100% oxygen Induction Type: IV induction Ventilation: Mask ventilation without difficulty Laryngoscope Size: Mac and 4 Grade View: Grade I Tube size: 7.5 mm Number of attempts: 1 Airway Equipment and Method: Stylet Placement Confirmation: ETT inserted through vocal cords under direct vision,  positive ETCO2,  CO2 detector and breath sounds checked- equal and bilateral Secured at: 23 cm Tube secured with: Tape Dental Injury: Teeth and Oropharynx as per pre-operative assessment

## 2019-08-31 NOTE — Anesthesia Preprocedure Evaluation (Addendum)
Anesthesia Evaluation  Patient identified by MRN, date of birth, ID band Patient awake    Reviewed: Allergy & Precautions, NPO status , Patient's Chart, lab work & pertinent test results  Airway Mallampati: II  TM Distance: >3 FB Neck ROM: Full    Dental no notable dental hx. (+) Teeth Intact   Pulmonary neg pulmonary ROS,    Pulmonary exam normal breath sounds clear to auscultation       Cardiovascular Exercise Tolerance: Good + angina + DOE  Normal cardiovascular examI Rhythm:Regular Rate:Normal  States active -cathed in 02/2019 -normal - Reports good ET Denies CP/DOE since March  Never uses Nitrates    Neuro/Psych Anxiety negative neurological ROS  negative psych ROS   GI/Hepatic negative GI ROS, Neg liver ROS,   Endo/Other  negative endocrine ROS  Renal/GU negative Renal ROS  negative genitourinary   Musculoskeletal negative musculoskeletal ROS (+)   Abdominal   Peds negative pediatric ROS (+)  Hematology negative hematology ROS (+)   Anesthesia Other Findings   Reproductive/Obstetrics negative OB ROS                             Anesthesia Physical Anesthesia Plan  ASA: II  Anesthesia Plan: General and Regional   Post-op Pain Management:  Regional for Post-op pain and GA combined w/ Regional for post-op pain   Induction: Intravenous  PONV Risk Score and Plan: 2 and Midazolam, Ondansetron, Treatment may vary due to age or medical condition and Dexamethasone  Airway Management Planned: Oral ETT  Additional Equipment:   Intra-op Plan:   Post-operative Plan: Extubation in OR  Informed Consent: I have reviewed the patients History and Physical, chart, labs and discussed the procedure including the risks, benefits and alternatives for the proposed anesthesia with the patient or authorized representative who has indicated his/her understanding and acceptance.     Dental  advisory given  Plan Discussed with: CRNA  Anesthesia Plan Comments: (Plan Full PPE use  Plan GA with GETA as needed d/w pt -WTP with same after Q&A  Offered Preop R ISB N. Block for POPM PSR -WTP with same after Q&A)       Anesthesia Quick Evaluation

## 2019-08-31 NOTE — Addendum Note (Signed)
Addendum  created 08/31/19 1416 by Ollen Bowl, CRNA   Charge Capture section accepted

## 2019-09-01 ENCOUNTER — Encounter (HOSPITAL_COMMUNITY): Payer: Self-pay | Admitting: Orthopedic Surgery

## 2019-09-04 ENCOUNTER — Ambulatory Visit (INDEPENDENT_AMBULATORY_CARE_PROVIDER_SITE_OTHER): Payer: 59 | Admitting: Orthopedic Surgery

## 2019-09-04 ENCOUNTER — Other Ambulatory Visit: Payer: Self-pay

## 2019-09-04 ENCOUNTER — Telehealth: Payer: Self-pay | Admitting: Orthopedic Surgery

## 2019-09-04 ENCOUNTER — Other Ambulatory Visit: Payer: Self-pay | Admitting: Orthopedic Surgery

## 2019-09-04 VITALS — BP 129/76 | HR 68 | Temp 98.2°F | Ht 73.0 in | Wt 220.0 lb

## 2019-09-04 DIAGNOSIS — Z9889 Other specified postprocedural states: Secondary | ICD-10-CM

## 2019-09-04 DIAGNOSIS — G8918 Other acute postprocedural pain: Secondary | ICD-10-CM

## 2019-09-04 MED ORDER — OXYCODONE-ACETAMINOPHEN 7.5-325 MG PO TABS: 1.0000 | ORAL_TABLET | ORAL | 0 refills | Status: AC | PRN

## 2019-09-04 NOTE — Progress Notes (Signed)
Patient requesting stronger pain medicine for postop pain Percocet 7.5 mg for 5 days then resume hydrocodone  Patient advised to take all medicines prescribed including Zanaflex

## 2019-09-04 NOTE — Telephone Encounter (Signed)
Call received from [facility], [place] pharmacist, [facility] in [place] via voice message. States regarding the prescription for change of medication, Oxycodone - states patient's insurance has a limit, 7 days; therefore, they are filling it although not the full prescription; said they are obligated to notify the prescriber.

## 2019-09-04 NOTE — Progress Notes (Signed)
POSTOP VISIT  POD #4 s/p rotator cuff repair  Chief Complaint  Patient presents with  . Post-op Follow-up    Recheck on right shoulder, DOS 08-31-19.    08/31/2019  1:03 PM  PATIENT:  Mario Meyer  58 y.o. male  PRE-OPERATIVE DIAGNOSIS:  right shoulder biceps tendonitis  POST-OPERATIVE DIAGNOSIS: Partial articular surface tendon avulsion rotator cuff right shoulder with right shoulder biceps tendonitis  PROCEDURE:  Procedure(s): RIGHT SHOULDER ARTHROSCOPY with open rotator cuff repair (Right)  Findings mild tendinitis of the biceps tendon with degenerative tearing in the bicipital groove the intra-articular portion was normal there was mild fraying of the anterior labrum mild degeneration of the superior surface of the subscapularis and a PASTA lesion of the supraspinatus  Subacromial space severe bursitis  SURGEON:  Surgeon(s) and Role:    Carole Civil, MD - Primary     Encounter Diagnosis  Name Primary?  . Post-op pain Yes    Start therapy  Change his medicine from hydrocodone to Percocet continue sling follow-up on the seventh  Postoperative plan (Work, WB, No orders of the defined types were placed in this encounter. ,FU)  Current Outpatient Medications  Medication Instructions  . HYDROcodone-acetaminophen (NORCO) 10-325 MG tablet 1 tablet, Oral, Every 4 hours PRN  . oxyCODONE-acetaminophen (PERCOCET) 7.5-325 MG tablet 1 tablet, Oral, Every 4 hours PRN  . promethazine (PHENERGAN) 12.5 mg, Oral, Every 6 hours PRN  . tiZANidine (ZANAFLEX) 4 mg, Oral, 3 times daily    Order therapy

## 2019-09-04 NOTE — Addendum Note (Signed)
Addended byCandice Camp on: 09/04/2019 12:26 PM   Modules accepted: Orders

## 2019-09-08 ENCOUNTER — Ambulatory Visit: Payer: 59 | Admitting: Orthopedic Surgery

## 2019-09-11 ENCOUNTER — Telehealth: Payer: Self-pay | Admitting: Orthopedic Surgery

## 2019-09-11 NOTE — Telephone Encounter (Signed)
Called patient in response to his voice message left relaying that he has been unable to reach [facility] [place]; states was there one time, and that someone was to call him back. Wanted to make sure he had the correct phone #. I returned call, reached his voice mail, and left message with the phone number requested.  Please advise if any other information is needed to provide patient. His [phone].

## 2019-09-12 NOTE — Telephone Encounter (Signed)
No, just phone number, thanks

## 2019-09-13 ENCOUNTER — Ambulatory Visit (INDEPENDENT_AMBULATORY_CARE_PROVIDER_SITE_OTHER): Payer: 59 | Admitting: Orthopedic Surgery

## 2019-09-13 ENCOUNTER — Other Ambulatory Visit: Payer: Self-pay

## 2019-09-13 ENCOUNTER — Encounter: Payer: Self-pay | Admitting: Orthopedic Surgery

## 2019-09-13 VITALS — BP 122/73 | HR 63 | Temp 97.1°F | Ht 73.0 in | Wt 213.6 lb

## 2019-09-13 DIAGNOSIS — M75121 Complete rotator cuff tear or rupture of right shoulder, not specified as traumatic: Secondary | ICD-10-CM

## 2019-09-13 DIAGNOSIS — G8918 Other acute postprocedural pain: Secondary | ICD-10-CM

## 2019-09-13 DIAGNOSIS — Z9889 Other specified postprocedural states: Secondary | ICD-10-CM

## 2019-09-13 NOTE — Patient Instructions (Signed)
CONTINUE THERAPY   SLING X 4 WEEKS MAY REMOVE JUST DONT MOVE AWAY FROM THE BODY

## 2019-09-13 NOTE — Progress Notes (Signed)
Chief Complaint  Patient presents with  . Post-op Follow-up    right shoulder   DOD 08/31/2019  POD 13   RIGHT SHOULDER ROTATOR CUFF REPAIR   WOUND CK: CLEAN DRY NO ERYTHEMA OR DRAINAGE   Encounter Diagnoses  Name Primary?  . S/P arthroscopy of right shoulder  biceps tenodesis 08/31/19 Yes  . Post-op pain   . Nontraumatic complete tear of right rotator cuff    PHYS THERAPY   4 WEEKS

## 2019-10-11 ENCOUNTER — Encounter: Payer: Self-pay | Admitting: Orthopedic Surgery

## 2019-10-11 ENCOUNTER — Ambulatory Visit (INDEPENDENT_AMBULATORY_CARE_PROVIDER_SITE_OTHER): Payer: 59 | Admitting: Orthopedic Surgery

## 2019-10-11 ENCOUNTER — Other Ambulatory Visit: Payer: Self-pay

## 2019-10-11 VITALS — BP 131/80 | HR 68 | Temp 97.9°F | Ht 73.0 in | Wt 213.0 lb

## 2019-10-11 DIAGNOSIS — Z9889 Other specified postprocedural states: Secondary | ICD-10-CM

## 2019-10-11 MED ORDER — TIZANIDINE HCL 4 MG PO TABS: 4.0000 mg | ORAL_TABLET | Freq: Every day | ORAL | 1 refills | Status: DC

## 2019-10-11 NOTE — Progress Notes (Signed)
Chief Complaint  Patient presents with  . Follow-up    4 week recheck on right shoulder, DOS 08-31-19.    C/o pain at the surhgery site    PRE-OPERATIVE DIAGNOSIS:  right shoulder biceps tendonitis   POST-OPERATIVE DIAGNOSIS: Partial articular surface tendon avulsion rotator cuff right shoulder with right shoulder biceps tendonitis   PROCEDURE:  Procedure(s): RIGHT SHOULDER ARTHROSCOPY with open rotator cuff repair (Right)   Findings mild tendinitis of the biceps tendon with degenerative tearing in the bicipital groove the intra-articular portion was normal there was mild fraying of the anterior labrum mild degeneration of the superior surface of the subscapularis and a PASTA lesion of the supraspinatus  Patient complains of a lot of pain at the surgery site, very stiff, recommend continued passive motion 2 more weeks before trying any active motion  Some concern over the amount of stiffness at this point  Follow-up 4 weeks continue therapy  Meds ordered this encounter  Medications  . tiZANidine (ZANAFLEX) 4 MG tablet    Sig: Take 1 tablet (4 mg total) by mouth daily.    Dispense:  30 tablet    Refill:  1

## 2019-11-08 ENCOUNTER — Encounter: Payer: Self-pay | Admitting: Orthopedic Surgery

## 2019-11-08 ENCOUNTER — Other Ambulatory Visit: Payer: Self-pay

## 2019-11-08 ENCOUNTER — Ambulatory Visit (INDEPENDENT_AMBULATORY_CARE_PROVIDER_SITE_OTHER): Payer: 59 | Admitting: Orthopedic Surgery

## 2019-11-08 DIAGNOSIS — Z9889 Other specified postprocedural states: Secondary | ICD-10-CM

## 2019-11-08 NOTE — Patient Instructions (Signed)
Continue PT   Apply heat pad when its cold apply biofreeze   If pain in 6/10 or less take tylenol or Ibuprofen and if 7 or higher you can take hydrocodone

## 2019-11-08 NOTE — Progress Notes (Signed)
Chief Complaint  Patient presents with  . Post-op Problem    right shoulder pain s/p surgery 08/31/19 has pain down arm into forearm    Encounter Diagnosis  Name Primary?  . S/P arthroscopy of right shoulder  biceps tenodesis 08/31/19 Yes     58 year old male had a right rotator cuff repair surgery back in November he is about 9 or 10 weeks out complains of stiffness and pain in the biceps tendon runs all the way down in the forearm.  That was a major part of his issue.  He can remove his sling he will continue his therapy and see [place] back in 4 weeks.

## 2019-12-07 DIAGNOSIS — Z9889 Other specified postprocedural states: Secondary | ICD-10-CM | POA: Insufficient documentation

## 2019-12-11 ENCOUNTER — Other Ambulatory Visit: Payer: Self-pay

## 2019-12-11 ENCOUNTER — Encounter: Payer: Self-pay | Admitting: Orthopedic Surgery

## 2019-12-11 ENCOUNTER — Ambulatory Visit (INDEPENDENT_AMBULATORY_CARE_PROVIDER_SITE_OTHER): Payer: 59 | Admitting: Orthopedic Surgery

## 2019-12-11 VITALS — BP 153/83 | HR 81 | Ht 73.0 in | Wt 212.0 lb

## 2019-12-11 DIAGNOSIS — Z9889 Other specified postprocedural states: Secondary | ICD-10-CM | POA: Diagnosis not present

## 2019-12-11 DIAGNOSIS — M25511 Pain in right shoulder: Secondary | ICD-10-CM | POA: Diagnosis not present

## 2019-12-11 MED ORDER — HYDROCODONE-ACETAMINOPHEN 7.5-325 MG PO TABS: 1.0000 | ORAL_TABLET | Freq: Three times a day (TID) | ORAL | 0 refills | Status: DC | PRN

## 2019-12-11 NOTE — Patient Instructions (Signed)
Continue PT f/u March

## 2019-12-11 NOTE — Progress Notes (Signed)
Chief Complaint  Patient presents with  . Routine Post Op    08/31/19 right shoulder / still stiff painful     Mario Meyer is status post rotator cuff repair and biceps tenodesis.  He is having a significant amount of stiffness still has pain when he sleeps on his right side still has limited motion.  I reviewed his PT notes and he is indeed progressing very slowly  He has not reached his treatment goals  On examination today I can only abduct his arm about 60 degrees I can flex it in the scapular plane 120 degrees but it was painful he had tenderness over the incision near the tenodesis site  He had limited external rotation to 40 degrees  Encounter Diagnosis  Name Primary?  . S/P arthroscopy of right shoulder  biceps tenodesis 08/31/19 Yes    I discussed this with Mario Meyer and we decided to give him until March and if he does not improve then we need to repeat his MRI  He will continue his physical therapy  Meds ordered this encounter  Medications  . DISCONTD: HYDROcodone-acetaminophen (NORCO) 7.5-325 MG tablet    Sig: Take 1 tablet by mouth every 8 (eight) hours as needed for moderate pain.    Dispense:  42 tablet    Refill:  0  . HYDROcodone-acetaminophen (NORCO) 7.5-325 MG tablet    Sig: Take 1 tablet by mouth every 8 (eight) hours as needed for moderate pain.    Dispense:  42 tablet    Refill:  0    Return in March

## 2020-02-02 ENCOUNTER — Telehealth: Payer: Self-pay | Admitting: Family Medicine

## 2020-02-02 DIAGNOSIS — Z79899 Other long term (current) drug therapy: Secondary | ICD-10-CM

## 2020-02-02 DIAGNOSIS — E785 Hyperlipidemia, unspecified: Secondary | ICD-10-CM

## 2020-02-02 DIAGNOSIS — Z125 Encounter for screening for malignant neoplasm of prostate: Secondary | ICD-10-CM

## 2020-02-02 DIAGNOSIS — Z0000 Encounter for general adult medical examination without abnormal findings: Secondary | ICD-10-CM

## 2020-02-02 NOTE — Telephone Encounter (Signed)
Patient has physical on 3/23 and needing labs done

## 2020-02-02 NOTE — Telephone Encounter (Signed)
Orders put in and pt notified.  

## 2020-02-02 NOTE — Telephone Encounter (Signed)
CBC, lipid, metabolic 7, PSA, liver

## 2020-02-02 NOTE — Telephone Encounter (Signed)
Last labs ordered by dr Nicki Reaper and completed was 10/04/17 bmp, psa, lipid, liver

## 2020-02-07 ENCOUNTER — Encounter: Payer: Self-pay | Admitting: Orthopedic Surgery

## 2020-02-07 ENCOUNTER — Ambulatory Visit: Payer: 59 | Admitting: Orthopedic Surgery

## 2020-02-07 ENCOUNTER — Other Ambulatory Visit: Payer: Self-pay

## 2020-02-07 ENCOUNTER — Telehealth: Payer: Self-pay

## 2020-02-07 VITALS — BP 149/87 | HR 88 | Ht 73.0 in | Wt 217.0 lb

## 2020-02-07 DIAGNOSIS — Z9889 Other specified postprocedural states: Secondary | ICD-10-CM

## 2020-02-07 DIAGNOSIS — F419 Anxiety disorder, unspecified: Secondary | ICD-10-CM | POA: Diagnosis not present

## 2020-02-07 DIAGNOSIS — G8929 Other chronic pain: Secondary | ICD-10-CM | POA: Diagnosis not present

## 2020-02-07 DIAGNOSIS — M7501 Adhesive capsulitis of right shoulder: Secondary | ICD-10-CM

## 2020-02-07 MED ORDER — DIAZEPAM 10 MG PO TABS: 10.0000 mg | ORAL_TABLET | Freq: Once | ORAL | 0 refills | Status: AC

## 2020-02-07 NOTE — Telephone Encounter (Signed)
Patient is asking since his MRI is scheduled a few weeks out could Dr. Aline Brochure refer him to [facility] for PT.  Please call and advise

## 2020-02-07 NOTE — Telephone Encounter (Signed)
I called patient to advise him order was sent.

## 2020-02-07 NOTE — Progress Notes (Signed)
Chief Complaint  Patient presents with  . Post-op Follow-up    right shoulder 08/31/19 has decreased ROM    No Known Allergies  Mario Meyer is 59 years old he had a rotator cuff repair and biceps tenodesis on August 31, 2019 still having pain and not gaining motion  He has gone through physical therapy not showing any progression  He has tenderness over the front of the shoulder there is a mass there consistent with fluid   There is a possible deltoid disruption there  His active motion is 70 degrees of abduction and 30 degrees of external rotation and passive motion is not much better  Recommend MRI to evaluate the deltoid the cuff repair this will need to be done with contrast he will need 10 mg of Valium due to claustrophobia and anxiety for MRI  Encounter Diagnoses  Name Primary?  . S/P arthroscopy of right shoulder  biceps tenodesis 08/31/19   . Chronic right shoulder pain   . Anxiety Yes   Meds ordered this encounter  Medications  . diazepam (VALIUM) 10 MG tablet    Sig: Take 1 tablet (10 mg total) by mouth once for 1 dose.    Dispense:  1 tablet    Refill:  0

## 2020-02-07 NOTE — Telephone Encounter (Signed)
yes

## 2020-02-07 NOTE — Patient Instructions (Addendum)
You have been scheduled for an MRI scan  We will call your insurance company to do a precertification to get the MRI covered  You will receive a phone call regarding the date of the scan  Dr Aline Brochure will call you with the results

## 2020-02-27 ENCOUNTER — Encounter: Payer: 59 | Admitting: Family Medicine

## 2020-03-06 ENCOUNTER — Other Ambulatory Visit: Payer: Self-pay

## 2020-03-06 ENCOUNTER — Encounter (HOSPITAL_COMMUNITY): Payer: Self-pay

## 2020-03-06 ENCOUNTER — Ambulatory Visit (HOSPITAL_COMMUNITY)
Admission: RE | Admit: 2020-03-06 | Discharge: 2020-03-06 | Disposition: A | Discharge status: Home / Self Care | Payer: 59 | Source: Ambulatory Visit | Attending: Orthopedic Surgery | Admitting: Orthopedic Surgery

## 2020-03-06 DIAGNOSIS — G8929 Other chronic pain: Secondary | ICD-10-CM

## 2020-03-06 DIAGNOSIS — M25511 Pain in right shoulder: Secondary | ICD-10-CM | POA: Diagnosis not present

## 2020-03-06 DIAGNOSIS — M19011 Primary osteoarthritis, right shoulder: Secondary | ICD-10-CM | POA: Diagnosis not present

## 2020-03-06 DIAGNOSIS — M7591 Shoulder lesion, unspecified, right shoulder: Secondary | ICD-10-CM | POA: Insufficient documentation

## 2020-03-06 DIAGNOSIS — Z9889 Other specified postprocedural states: Secondary | ICD-10-CM

## 2020-03-06 MED ORDER — SODIUM CHLORIDE (PF) 0.9 % IJ SOLN
INTRAMUSCULAR | Status: AC
  Filled 2020-03-06: qty 10

## 2020-03-06 MED ORDER — IOHEXOL 180 MG/ML  SOLN
20.0000 mL | Freq: Once | INTRAMUSCULAR | Status: AC | PRN
  Administered 2020-03-06: 13:00:00 15 mL

## 2020-03-06 MED ORDER — POVIDONE-IODINE 10 % EX SOLN
CUTANEOUS | Status: AC
  Filled 2020-03-06: qty 15

## 2020-03-06 MED ORDER — LIDOCAINE HCL (PF) 1 % IJ SOLN
INTRAMUSCULAR | Status: AC
  Administered 2020-03-06: 13:00:00 5 mL
  Filled 2020-03-06: qty 5

## 2020-03-06 MED ORDER — GADOBUTROL 1 MMOL/ML IV SOLN
1.0000 mL | Freq: Once | INTRAVENOUS | Status: AC | PRN
  Administered 2020-03-06: 0.05 mL

## 2020-03-06 NOTE — Procedures (Signed)
Preprocedure Dx: Chronic right shoulder pain Postprocedure Dx: Chronic right shoulder pain Procedure  Fluoroscopically guided right joint injection for MR arthrogrpahy Radiologist:  Thornton Papas Anesthesia:  3 ml of 1% lidocaine Injectate:  10 ml of [0.05 ml godovist, 5 ml sterile saline, 15 ml omnipaque-180] Fluoro time:  0 minutes 36 seconds EBL:   None Complications: None

## 2020-03-12 ENCOUNTER — Telehealth: Payer: Self-pay | Admitting: Orthopedic Surgery

## 2020-03-12 NOTE — Telephone Encounter (Signed)
Patient called for MRI results, done 03/06/20 at [facility].

## 2020-03-14 NOTE — Telephone Encounter (Signed)
Rotator cuff study with contrast cuff repair intact results given to patient  Set up manipulation right shoulder under anesthesia with block  Complications of fracture discussed

## 2020-03-19 ENCOUNTER — Telehealth: Payer: Self-pay | Admitting: Radiology

## 2020-03-19 NOTE — Telephone Encounter (Signed)
-----   Message from Carole Civil, MD sent at 03/14/2020  3:47 PM EDT ----- Hanley Seamen him his resultsAgrees to proceed with right shoulder manipulation under anesthesia with interscalene block and propofol

## 2020-03-19 NOTE — Telephone Encounter (Signed)
Left message for him to call me back.  

## 2020-03-21 ENCOUNTER — Other Ambulatory Visit: Payer: Self-pay | Admitting: Orthopedic Surgery

## 2020-03-21 DIAGNOSIS — M7501 Adhesive capsulitis of right shoulder: Secondary | ICD-10-CM

## 2020-03-21 NOTE — Telephone Encounter (Signed)
I spoke to him, case posted.

## 2020-03-21 NOTE — Telephone Encounter (Signed)
I have advised him to go ahead and have physical therapy visits lined up for the day after surgery   Advised him he will get calls regarding preop testing

## 2020-03-21 NOTE — Progress Notes (Signed)
right shoulder manipulation under anesthesia with interscalene block and propofol

## 2020-03-21 NOTE — Telephone Encounter (Signed)
He called yesterday, I was in clinic.  I called left another message for him to call me back

## 2020-03-22 ENCOUNTER — Encounter: Payer: Self-pay | Admitting: Orthopedic Surgery

## 2020-04-10 ENCOUNTER — Telehealth: Payer: Self-pay | Admitting: Family Medicine

## 2020-04-10 NOTE — Telephone Encounter (Signed)
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 Nicki Reaper stating he is not on any medications and has no medical conditions.   Pt would like letter faxed to [phone].

## 2020-04-11 ENCOUNTER — Telehealth: Payer: Self-pay | Admitting: Orthopedic Surgery

## 2020-04-11 ENCOUNTER — Encounter: Payer: Self-pay | Admitting: Family Medicine

## 2020-04-11 NOTE — Telephone Encounter (Signed)
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   They just want clearance that he can do this.  Is it okay to write this note for him?   Thanks

## 2020-04-11 NOTE — Telephone Encounter (Signed)
Patient will pick up tomorrow.

## 2020-04-11 NOTE — Telephone Encounter (Signed)
yes

## 2020-04-11 NOTE — Telephone Encounter (Signed)
Dr Nicki Reaper routed to adm pool not sure why it got routed back to clinical pool.

## 2020-04-11 NOTE — Telephone Encounter (Signed)
Front I did dictate a letter for the patient regarding this Also remind patient that often DOT physical does not cover items such as prostate exam etc. and often DOT physicals do not do proper lab work to screen for health issues. So therefore I would recommend the patient strongly consider doing lab work and. wellness checkup this summer Please document he understands this thank you (If he decides to do lab work and physical we can certainly order the lab work and set up the physical thank you)

## 2020-04-15 ENCOUNTER — Other Ambulatory Visit: Payer: Self-pay

## 2020-04-15 ENCOUNTER — Telehealth: Payer: Self-pay | Admitting: Orthopedic Surgery

## 2020-04-15 ENCOUNTER — Other Ambulatory Visit (HOSPITAL_COMMUNITY)
Admission: RE | Admit: 2020-04-15 | Discharge: 2020-04-15 | Disposition: A | Discharge status: Home / Self Care | Payer: 59 | Source: Ambulatory Visit | Attending: Orthopedic Surgery | Admitting: Orthopedic Surgery

## 2020-04-15 ENCOUNTER — Encounter (HOSPITAL_COMMUNITY)
Admission: RE | Admit: 2020-04-15 | Discharge: 2020-04-15 | Disposition: A | Discharge status: Home / Self Care | Payer: 59 | Source: Ambulatory Visit | Attending: Orthopedic Surgery | Admitting: Orthopedic Surgery

## 2020-04-15 DIAGNOSIS — M7501 Adhesive capsulitis of right shoulder: Secondary | ICD-10-CM

## 2020-04-15 DIAGNOSIS — Z20822 Contact with and (suspected) exposure to covid-19: Secondary | ICD-10-CM | POA: Diagnosis not present

## 2020-04-15 DIAGNOSIS — Z01812 Encounter for preprocedural laboratory examination: Secondary | ICD-10-CM | POA: Diagnosis present

## 2020-04-15 LAB — CBC WITH DIFFERENTIAL/PLATELET
Abs Immature Granulocytes: 0.02 10*3/uL (ref 0.00–0.07)
Basophils Absolute: 0.1 10*3/uL (ref 0.0–0.1)
Basophils Relative: 1 %
Eosinophils Absolute: 0.3 10*3/uL (ref 0.0–0.5)
Eosinophils Relative: 4 %
HCT: 50 % (ref 39.0–52.0)
Hemoglobin: 16.2 g/dL (ref 13.0–17.0)
Immature Granulocytes: 0 %
Lymphocytes Relative: 30 %
Lymphs Abs: 2.3 10*3/uL (ref 0.7–4.0)
MCH: 27.4 pg (ref 26.0–34.0)
MCHC: 32.4 g/dL (ref 30.0–36.0)
MCV: 84.6 fL (ref 80.0–100.0)
Monocytes Absolute: 0.8 10*3/uL (ref 0.1–1.0)
Monocytes Relative: 10 %
Neutro Abs: 4.3 10*3/uL (ref 1.7–7.7)
Neutrophils Relative %: 55 %
Platelets: 296 10*3/uL (ref 150–400)
RBC: 5.91 MIL/uL — ABNORMAL HIGH (ref 4.22–5.81)
RDW: 13.9 % (ref 11.5–15.5)
WBC: 7.8 10*3/uL (ref 4.0–10.5)
nRBC: 0 % (ref 0.0–0.2)

## 2020-04-15 LAB — BASIC METABOLIC PANEL
Anion gap: 8 (ref 5–15)
BUN: 16 mg/dL (ref 6–20)
CO2: 24 mmol/L (ref 22–32)
Calcium: 8.9 mg/dL (ref 8.9–10.3)
Chloride: 104 mmol/L (ref 98–111)
Creatinine, Ser: 0.67 mg/dL (ref 0.61–1.24)
GFR calc Af Amer: >60 mL/min (ref >60)
GFR calc non Af Amer: >60 mL/min (ref >60)
Glucose, Bld: 109 mg/dL — ABNORMAL HIGH (ref 70–99)
Potassium: 3.9 mmol/L (ref 3.5–5.1)
Sodium: 136 mmol/L (ref 135–145)

## 2020-04-15 LAB — SARS CORONAVIRUS 2 (TAT 6-24 HRS): SARS Coronavirus 2: NEGATIVE

## 2020-04-15 NOTE — Patient Instructions (Signed)
Your procedure is scheduled on: 04/16/2020  Report to [name] at  7:30   AM.  Call this number if you have problems the morning of surgery: [phone]   Remember:   Do not Eat or Drink after midnight         No Smoking the morning of surgery  :  Take these medicines the morning of surgery with A SIP OF WATER:none    Do not wear jewelry, make-up or nail polish.  Do not wear lotions, powders, or perfumes. You may wear deodorant.  Do not shave 48 hours prior to surgery. Men may shave face and neck.  Do not bring valuables to the hospital.  Contacts, dentures or bridgework may not be worn into surgery.  Leave suitcase in the car. After surgery it may be brought to your room.  For patients admitted to the hospital, checkout time is 11:00 AM the day of discharge.   Patients discharged the day of surgery will not be allowed to drive home.     Shoulder Pain Many things can cause shoulder pain, including:  An injury.  Moving the shoulder in the same way again and again (overuse).  Joint pain (arthritis). Pain can come from:  Swelling and irritation (inflammation) of any part of the shoulder.  An injury to the shoulder joint.  An injury to: ? Tissues that connect muscle to bone (tendons). ? Tissues that connect bones to each other (ligaments). ? Bones. Follow these instructions at home: Watch for changes in your symptoms. Let your doctor know about them. Follow these instructions to help with your pain. If you have a sling:  Wear the sling as told by your doctor. Remove it only as told by your doctor.  Loosen the sling if your fingers: ? Tingle. ? Become numb. ? Turn cold and blue.  Keep the sling clean.  If the sling is not waterproof: ? Do not let it get wet. ? Take the sling off when you shower or bathe. Managing pain, stiffness, and swelling   If told, put ice on the painful area: ? Put ice in a plastic bag. ? Place a towel between your skin and the bag.  ? Leave the ice on for 20 minutes, 2-3 times a day. Stop putting ice on if it does not help with the pain.  Squeeze a soft ball or a foam pad as much as possible. This prevents swelling in the shoulder. It also helps to strengthen the arm. General instructions  Take over-the-counter and prescription medicines only as told by your doctor.  Keep all follow-up visits as told by your doctor. This is important. Contact a doctor if:  Your pain gets worse.  Medicine does not help your pain.  You have new pain in your arm, hand, or fingers. Get help right away if:  Your arm, hand, or fingers: ? Tingle. ? Are numb. ? Are swollen. ? Are painful. ? Turn white or blue. Summary  Shoulder pain can be caused by many things. These include injury, moving the shoulder in the same away again and again, and joint pain.  Watch for changes in your symptoms. Let your doctor know about them.  This condition may be treated with a sling, ice, and pain medicine.  Contact your doctor if the pain gets worse or you have new pain. Get help right away if your arm, hand, or fingers tingle or get numb, swollen, or painful.  Keep all follow-up visits as told by  your doctor. This is important. This information is not intended to replace advice given to you by your health care provider. Make sure you discuss any questions you have with your health care provider. Document Revised: 06/07/2018 Document Reviewed: 06/07/2018 Elsevier Patient Education  2020 [place] After These instructions provide you with information about caring for yourself after your procedure. Your health care provider may also give you more specific instructions. Your treatment has been planned according to current medical practices, but problems sometimes occur. Call your health care provider if you have any problems or questions after your procedure. What can I expect after the procedure? After your  procedure, you may:  Feel sleepy for several hours.  Feel clumsy and have poor balance for several hours.  Feel forgetful about what happened after the procedure.  Have poor judgment for several hours.  Feel nauseous or vomit.  Have a sore throat if you had a breathing tube during the procedure. Follow these instructions at home: For at least 24 hours after the procedure:      Have a responsible adult stay with you. It is important to have someone help care for you until you are awake and alert.  Rest as needed.  Do not: ? Participate in activities in which you could fall or become injured. ? Drive. ? Use heavy machinery. ? Drink alcohol. ? Take sleeping pills or medicines that cause drowsiness. ? Make important decisions or sign legal documents. ? Take care of children on your own. Eating and drinking  Follow the diet that is recommended by your health care provider.  If you vomit, drink water, juice, or soup when you can drink without vomiting.  Make sure you have little or no nausea before eating solid foods. General instructions  Take over-the-counter and prescription medicines only as told by your health care provider.  If you have sleep apnea, surgery and certain medicines can increase your risk for breathing problems. Follow instructions from your health care provider about wearing your sleep device: ? Anytime you are sleeping, including during daytime naps. ? While taking prescription pain medicines, sleeping medicines, or medicines that make you drowsy.  If you smoke, do not smoke without supervision.  Keep all follow-up visits as told by your health care provider. This is important. Contact a health care provider if:  You keep feeling nauseous or you keep vomiting.  You feel light-headed.  You develop a rash.  You have a fever. Get help right away if:  You have trouble breathing. Summary  For several hours after your procedure, you may feel  sleepy and have poor judgment.  Have a responsible adult stay with you for at least 24 hours or until you are awake and alert. This information is not intended to replace advice given to you by your health care provider. Make sure you discuss any questions you have with your health care provider. Document Revised: 02/21/2018 Document Reviewed: 03/15/2016 Elsevier Patient Education  [place].

## 2020-04-15 NOTE — Telephone Encounter (Signed)
Beth from [employer] called and requested a referral for post op eval that he is coming in for on Wednesday.  Thanks

## 2020-04-15 NOTE — Telephone Encounter (Signed)
Will fax, I thought he was at [facility], but he must have changed his mind. Printed sent for signature

## 2020-04-16 ENCOUNTER — Encounter (HOSPITAL_COMMUNITY)
Admission: RE | Disposition: A | Discharge status: Home / Self Care | Payer: Self-pay | Source: Home / Self Care | Attending: Orthopedic Surgery

## 2020-04-16 ENCOUNTER — Ambulatory Visit (HOSPITAL_COMMUNITY): Payer: 59

## 2020-04-16 ENCOUNTER — Ambulatory Visit (HOSPITAL_COMMUNITY)
Admission: RE | Admit: 2020-04-16 | Discharge: 2020-04-16 | Disposition: A | Discharge status: Home / Self Care | Payer: 59 | Attending: Orthopedic Surgery | Admitting: Orthopedic Surgery

## 2020-04-16 ENCOUNTER — Ambulatory Visit (HOSPITAL_COMMUNITY): Payer: 59 | Admitting: Anesthesiology

## 2020-04-16 ENCOUNTER — Telehealth: Payer: Self-pay | Admitting: Orthopedic Surgery

## 2020-04-16 DIAGNOSIS — M7501 Adhesive capsulitis of right shoulder: Secondary | ICD-10-CM | POA: Insufficient documentation

## 2020-04-16 DIAGNOSIS — S4991XA Unspecified injury of right shoulder and upper arm, initial encounter: Secondary | ICD-10-CM

## 2020-04-16 DIAGNOSIS — M24611 Ankylosis, right shoulder: Secondary | ICD-10-CM | POA: Diagnosis not present

## 2020-04-16 HISTORY — PX: SHOULDER CLOSED REDUCTION: SHX1051

## 2020-04-16 SURGERY — MANIPULATION, JOINT, SHOULDER, WITH ANESTHESIA
Anesthesia: Regional | Site: Shoulder | Laterality: Right

## 2020-04-16 MED ORDER — PROPOFOL 10 MG/ML IV BOLUS
INTRAVENOUS | Status: DC | PRN
  Administered 2020-04-16: 50 mg via INTRAVENOUS
  Administered 2020-04-16: 20 mg via INTRAVENOUS
  Administered 2020-04-16: 80 mg via INTRAVENOUS

## 2020-04-16 MED ORDER — HYDROMORPHONE HCL 1 MG/ML IJ SOLN: 0.2500 mg | INTRAMUSCULAR | Status: DC | PRN

## 2020-04-16 MED ORDER — LACTATED RINGERS IV SOLN: INTRAVENOUS | Status: DC | PRN

## 2020-04-16 MED ORDER — FENTANYL CITRATE (PF) 100 MCG/2ML IJ SOLN
INTRAMUSCULAR | Status: AC
  Filled 2020-04-16: qty 2

## 2020-04-16 MED ORDER — MEPERIDINE HCL 50 MG/ML IJ SOLN: 6.2500 mg | INTRAMUSCULAR | Status: DC | PRN

## 2020-04-16 MED ORDER — DEXAMETHASONE SODIUM PHOSPHATE 4 MG/ML IJ SOLN
INTRAMUSCULAR | Status: DC | PRN
Start: 2020-04-16 — End: 2020-04-16
  Administered 2020-04-16: 5 mg via PERINEURAL

## 2020-04-16 MED ORDER — ROPIVACAINE HCL 5 MG/ML IJ SOLN
INTRAMUSCULAR | Status: AC
  Filled 2020-04-16: qty 30

## 2020-04-16 MED ORDER — MIDAZOLAM HCL 2 MG/2ML IJ SOLN
INTRAMUSCULAR | Status: AC
  Filled 2020-04-16: qty 2

## 2020-04-16 MED ORDER — MIDAZOLAM HCL 2 MG/2ML IJ SOLN
2.0000 mg | Freq: Once | INTRAMUSCULAR | Status: AC
  Administered 2020-04-16: 2 mg via INTRAVENOUS
  Filled 2020-04-16: qty 2

## 2020-04-16 MED ORDER — CEFAZOLIN SODIUM-DEXTROSE 2-4 GM/100ML-% IV SOLN
2.0000 g | INTRAVENOUS | Status: AC
  Administered 2020-04-16: 10:00:00 2 g via INTRAVENOUS
  Filled 2020-04-16: qty 100

## 2020-04-16 MED ORDER — MIDAZOLAM HCL 5 MG/5ML IJ SOLN
INTRAMUSCULAR | Status: DC | PRN
  Administered 2020-04-16: 1 mg via INTRAVENOUS

## 2020-04-16 MED ORDER — DEXAMETHASONE SODIUM PHOSPHATE 4 MG/ML IJ SOLN
INTRAMUSCULAR | Status: AC
  Filled 2020-04-16: qty 1

## 2020-04-16 MED ORDER — ONDANSETRON HCL 4 MG/2ML IJ SOLN: 4.0000 mg | Freq: Once | INTRAMUSCULAR | Status: DC | PRN

## 2020-04-16 MED ORDER — LACTATED RINGERS IV SOLN: Freq: Once | INTRAVENOUS | Status: AC

## 2020-04-16 MED ORDER — PROPOFOL 10 MG/ML IV BOLUS
INTRAVENOUS | Status: AC
  Filled 2020-04-16: qty 20

## 2020-04-16 MED ORDER — LIDOCAINE HCL (PF) 1 % IJ SOLN
INTRAMUSCULAR | Status: DC | PRN
  Administered 2020-04-16: 3 mL

## 2020-04-16 MED ORDER — ROPIVACAINE HCL 5 MG/ML IJ SOLN
INTRAMUSCULAR | Status: DC | PRN
  Administered 2020-04-16: 22 mL via PERINEURAL

## 2020-04-16 MED ORDER — LIDOCAINE HCL (PF) 1 % IJ SOLN
INTRAMUSCULAR | Status: AC
  Filled 2020-04-16: qty 30

## 2020-04-16 MED ORDER — OXYCODONE-ACETAMINOPHEN 7.5-325 MG PO TABS: 1.0000 | ORAL_TABLET | ORAL | 0 refills | Status: DC | PRN

## 2020-04-16 SURGICAL SUPPLY — 3 items
CLOTH BEACON ORANGE TIMEOUT ST (SAFETY) ×3 IMPLANT
KIT TURNOVER KIT A (KITS) ×3 IMPLANT
SLING ARM FOAM STRAP LRG (SOFTGOODS) ×2 IMPLANT

## 2020-04-16 NOTE — Brief Op Note (Signed)
04/16/2020  10:38 AM  PATIENT:  Mario Meyer  59 y.o. male  PRE-OPERATIVE DIAGNOSIS:  adhesive capsulitis right shoulder  POST-OPERATIVE DIAGNOSIS:  adhesive capsulitis right shoulder  PROCEDURE:  Procedure(s): CLOSED MANIPULATION RIGHT SHOULDER (Right)   Preop range of motion 70 degrees flexion 30 degrees external rotation 85 degrees abduction  Postop range of motion 170 degrees flexion 45 degrees external rotation 110 degrees abduction   SURGEON:  Surgeon(s) and Role:    Carole Civil, MD - Primary  PHYSICIAN ASSISTANT:   ASSISTANTS: none   ANESTHESIA:   IV sedation and paracervical block  EBL:  n/a   BLOOD ADMINISTERED:none  DRAINS: none   LOCAL MEDICATIONS USED:  NONE  SPECIMEN:  No Specimen  DISPOSITION OF SPECIMEN:  N/A  COUNTS:  YES  TOURNIQUET:  * No tourniquets in log *  DICTATION: .Dragon Dictation  PLAN OF CARE: Discharge to home after PACU  PATIENT DISPOSITION:  PACU - hemodynamically stable.   Delay start of Pharmacological VTE agent (>24hrs) due to surgical blood loss or risk of bleeding: not applicable  Surgical details of procedure  Patient was seen in preop the right shoulder was marked for surgery countersigned by the physician.  Chart review was completed  Patient was taken to the operating room for IV sedation after preop paracervical block.  Patient requested short acting block.  After adequate sedation and timeout was completed  Her preop assessment of range of motion was recorded see above.  The arm was then taken into flexion audible crepitance was noted and visible release was noted and flexion up to 170 degrees  We then increased his abduction with crepitation and release was visible as well.  This increased up to 110 degrees  And then I externally rotated the arm out to 45 degrees and did not push it any further not wanting to cause a fracture  The patient was allowed to reverse from anesthesia and placed in a  sling and taken to recovery room in stable condition

## 2020-04-16 NOTE — Transfer of Care (Signed)
Immediate Anesthesia Transfer of Care Note  Patient: Mario Meyer  Procedure(s) Performed: CLOSED MANIPULATION RIGHT SHOULDER (Right Shoulder)  Patient Location: PACU  Anesthesia Type:MAC  Level of Consciousness: awake  Airway & Oxygen Therapy: Patient Spontanous Breathing  Post-op Assessment: Report given to RN  Post vital signs: Reviewed and stable  Last Vitals:  Vitals Value Taken Time  BP 141/80 04/16/20 1045  Temp    Pulse 78 04/16/20 1047  Resp 18 04/16/20 1047  SpO2 94 % 04/16/20 1047  Vitals shown include unvalidated device data.  Last Pain: There were no vitals filed for this visit.       Complications: No apparent anesthesia complications

## 2020-04-16 NOTE — Op Note (Signed)
04/16/2020  10:38 AM  PATIENT:  Mario Meyer  59 y.o. male  PRE-OPERATIVE DIAGNOSIS:  adhesive capsulitis right shoulder  POST-OPERATIVE DIAGNOSIS:  adhesive capsulitis right shoulder  PROCEDURE:  Procedure(s): CLOSED MANIPULATION RIGHT SHOULDER (Right)   Preop range of motion 70 degrees flexion 30 degrees external rotation 85 degrees abduction  Postop range of motion 170 degrees flexion 45 degrees external rotation 110 degrees abduction   SURGEON:  Surgeon(s) and Role:    Carole Civil, MD - Primary  PHYSICIAN ASSISTANT:   ASSISTANTS: none   ANESTHESIA:   IV sedation and paracervical block  EBL:  n/a   BLOOD ADMINISTERED:none  DRAINS: none   LOCAL MEDICATIONS USED:  NONE  SPECIMEN:  No Specimen  DISPOSITION OF SPECIMEN:  N/A  COUNTS:  YES  TOURNIQUET:  * No tourniquets in log *  DICTATION: .Dragon Dictation  PLAN OF CARE: Discharge to home after PACU  PATIENT DISPOSITION:  PACU - hemodynamically stable.   Delay start of Pharmacological VTE agent (>24hrs) due to surgical blood loss or risk of bleeding: not applicable  Surgical details of procedure  Patient was seen in preop the right shoulder was marked for surgery countersigned by the physician.  Chart review was completed  Patient was taken to the operating room for IV sedation after preop paracervical block.  Patient requested short acting block.  After adequate sedation and timeout was completed  Her preop assessment of range of motion was recorded see above.  The arm was then taken into flexion audible crepitance was noted and visible release was noted and flexion up to 170 degrees  We then increased his abduction with crepitation and release was visible as well.  This increased up to 110 degrees  And then I externally rotated the arm out to 45 degrees and did not push it any further not wanting to cause a fracture  The patient was allowed to reverse from anesthesia and placed in a  sling and taken to recovery room in stable condition

## 2020-04-16 NOTE — Anesthesia Procedure Notes (Signed)
Anesthesia Regional Block: Interscalene brachial plexus block   Pre-Anesthetic Checklist: ,, timeout performed, Correct Patient, Correct Site, Correct Laterality, Correct Procedure, Correct Position, site marked, Risks and benefits discussed,  Surgical consent,  Pre-op evaluation,  At surgeon's request and post-op pain management  Laterality: Upper  Prep: chloraprep       Needles:  Injection technique: Single-shot  Needle Type: Echogenic Stimulator Needle     Needle Length: 8.3cm  Needle Gauge: 22   Needle insertion depth: 4 cm   Additional Needles:   Narrative:  Start time: 04/16/2020 9:36 AM End time: 04/16/2020 9:46 AM Injection made incrementally with aspirations every 5 mL.  Performed by: Personally  Anesthesiologist: Denese Killings, MD  Additional Notes: Block assessed prior to start of surgery

## 2020-04-16 NOTE — Anesthesia Postprocedure Evaluation (Signed)
Anesthesia Post Note  Patient: CHAQUAN ECKLEY  Procedure(s) Performed: CLOSED MANIPULATION RIGHT SHOULDER (Right Shoulder)  Patient location during evaluation: PACU Anesthesia Type: Regional Level of consciousness: awake and alert Pain management: satisfactory to patient Vital Signs Assessment: post-procedure vital signs reviewed and stable Respiratory status: spontaneous breathing Cardiovascular status: blood pressure returned to baseline and stable Postop Assessment: no apparent nausea or vomiting Anesthetic complications: no     Last Vitals:  Vitals:   04/16/20 1045  BP: (!) 141/80  Pulse: 80  Resp: 17  Temp: (P) 36.7 C  SpO2: 96%    Last Pain: There were no vitals filed for this visit.               Mariel Lukins

## 2020-04-16 NOTE — Telephone Encounter (Signed)
Cathy from [facility] [employer] in [place] called and stated that Dr. Aline Brochure had just sent in prescription for this patient.  She stated that due to his insurance, he is only allowed 4 pills a day.  His insurance limits a total of 28 pills.  She just wanted to let our office know this.

## 2020-04-16 NOTE — Addendum Note (Signed)
Addendum  created 04/16/20 1419 by Denese Killings, MD   Charge Capture section accepted

## 2020-04-16 NOTE — Telephone Encounter (Signed)
Noted. Aware  Dr Aline Brochure has previously been advised also.   FYI only, he could not get 42 pills oxycodone only 28

## 2020-04-16 NOTE — Anesthesia Preprocedure Evaluation (Signed)
Anesthesia Evaluation  Patient identified by MRN, date of birth, ID band Patient awake    Reviewed: Allergy & Precautions, NPO status , Patient's Chart, lab work & pertinent test results  Airway Mallampati: II  TM Distance: >3 FB Neck ROM: Full    Dental  (+) Teeth Intact, Caps Crowns :   Pulmonary  Room air oxygen saturation- 95%  Room air oxygen saturation- 95% Pulmonary exam normal breath sounds clear to auscultation       Cardiovascular Exercise Tolerance: Good + angina + DOE  Normal cardiovascular examI Rhythm:Regular Rate:Normal  States active -cathed in 02/2019 -normal - Reports good ET Denies CP/DOE since March  Never uses Nitrates    Neuro/Psych Anxiety negative neurological ROS  negative psych ROS   GI/Hepatic negative GI ROS, Neg liver ROS,   Endo/Other  negative endocrine ROS  Renal/GU negative Renal ROS  negative genitourinary   Musculoskeletal  (+) Arthritis , Osteoarthritis,    Abdominal   Peds negative pediatric ROS (+)  Hematology negative hematology ROS (+)   Anesthesia Other Findings   Reproductive/Obstetrics negative OB ROS                             Anesthesia Physical  Anesthesia Plan  ASA: II  Anesthesia Plan: General and Regional   Post-op Pain Management:  Regional for Post-op pain and GA combined w/ Regional for post-op pain   Induction: Intravenous  PONV Risk Score and Plan: 2 and Treatment may vary due to age or medical condition  Airway Management Planned: Nasal Cannula, Natural Airway and Simple Face Mask  Additional Equipment:   Intra-op Plan:   Post-operative Plan:   Informed Consent: I have reviewed the patients History and Physical, chart, labs and discussed the procedure including the risks, benefits and alternatives for the proposed anesthesia with the patient or authorized representative who has indicated his/her understanding and  acceptance.     Dental advisory given  Plan Discussed with: CRNA and Surgeon  Anesthesia Plan Comments: ( )        Anesthesia Quick Evaluation

## 2020-04-16 NOTE — H&P (Signed)
Chief Complaint  Patient presents with  . Post-op Follow-up    right shoulder 08/31/19 has decreased ROM    No Known Allergies  Mario Meyer is 59 years old he had a rotator cuff repair and biceps tenodesis on August 31, 2019 still having pain and not gaining motion  He has gone through physical therapy not showing any progression  He has tenderness over the front of the shoulder  Past Medical History:  Diagnosis Date  . Panic attack    Past Surgical History:  Procedure Laterality Date  . COLONOSCOPY N/A 10/04/2015   Procedure: COLONOSCOPY;  Surgeon: Daneil Dolin, MD;  [facility];  Service: Endoscopy;  Laterality: N/A;  7:30 AM  . KNEE ARTHROSCOPY W/ ACL RECONSTRUCTION Right   . RIGHT/LEFT HEART CATH AND CORONARY ANGIOGRAPHY N/A 02/23/2019   Procedure: RIGHT/LEFT HEART CATH AND CORONARY ANGIOGRAPHY;  Surgeon: Belva Crome, MD;  Location: [facility];  Service: Cardiovascular;  Laterality: N/A;  . rotator cuff surgery Right   . SHOULDER ARTHROSCOPY WITH OPEN ROTATOR CUFF REPAIR Right 08/31/2019   Procedure: RIGHT SHOULDER ARTHROSCOPY with open rotator cuff repair;  Surgeon: Carole Civil, MD;  Location: [facility];  Service: Orthopedics;  Laterality: Right;   Family History  Problem Relation Age of Onset  . Cancer Mother    Social History   Tobacco Use  . Smoking status: Never Smoker  . Smokeless tobacco: Never Used  Substance Use Topics  . Alcohol use: No  . Drug use: No   Review of systems no chest pain or shortness of breath no fever chills nausea vomiting diarrhea easy bruising or bleeding no other joint pain remaining systems are negative  Physical Exam Constitutional:      General: He is not in acute distress.    Appearance: Normal appearance. He is normal weight. He is not ill-appearing, toxic-appearing or diaphoretic.  HENT:     Head: Normocephalic and atraumatic.     Right Ear: Tympanic membrane normal.     Left Ear: Tympanic  membrane normal.     Nose: No congestion or rhinorrhea.     Mouth/Throat:     Mouth: Mucous membranes are moist.     Pharynx: Oropharynx is clear.  Eyes:     Extraocular Movements: Extraocular movements intact.     Conjunctiva/sclera: Conjunctivae normal.     Pupils: Pupils are equal, round, and reactive to light.  Cardiovascular:     Rate and Rhythm: Normal rate and regular rhythm.     Pulses: Normal pulses.  Pulmonary:     Effort: Pulmonary effort is normal.     Breath sounds: Normal breath sounds.  Abdominal:     General: Abdomen is flat. Bowel sounds are normal. There is no distension.     Palpations: Abdomen is soft.  Musculoskeletal:     Cervical back: Neck supple.     Comments: Right shoulder incision looks clean dry and intact.  Patient has tenderness over the proximal portion of his arm near the biceps tenodesis site there is mild swelling in this area.  His abduction is 70 degrees his external rotation is only 30 degrees with his arm at his side.  Lymphadenopathy:     Cervical: No cervical adenopathy.  Skin:    General: Skin is warm and dry.     Capillary Refill: Capillary refill takes less than 2 seconds.     Coloration: Skin is not pale.     Findings: No erythema or rash.  Neurological:     General: No focal deficit present.     Mental Status: He is alert and oriented to person, place, and time.     Sensory: No sensory deficit.     Coordination: Coordination normal.     Gait: Gait normal.     Deep Tendon Reflexes: Reflexes normal.  Psychiatric:        Mood and Affect: Mood normal.        Behavior: Behavior normal.        Thought Content: Thought content normal.        Judgment: Judgment normal.    We repeated his MRI his MRI shows that he does show that he has an intact rotator cuff with no deltoid disruption.  After discussion of treatment options including manipulation versus continued therapy due to his lack of progression with physical therapy he is  agreeable to manipulation under anesthesia understanding that there is a risk of fracture which would require further surgery and further time for recovery and away from work  Plan manipulation under anesthesia right shoulder

## 2020-04-16 NOTE — Interval H&P Note (Signed)
History and Physical Interval Note:  04/16/2020 10:14 AM  Mario Meyer  has presented today for surgery, with the diagnosis of adhesive capsulitis right shoulder.  The various methods of treatment have been discussed with the patient and family. After consideration of risks, benefits and other options for treatment, the patient has consented to  Procedure(s): CLOSED MANIPULATION SHOULDER (Right) as a surgical intervention.  The patient's history has been reviewed, patient examined, no change in status, stable for surgery.  I have reviewed the patient's chart and labs.  Questions were answered to the patient's satisfaction.     Arther Abbott

## 2020-04-20 IMAGING — MR MR SHOULDER*R* W/CM
4 of 5 series · 19 of 40 positions shown · IV contrast (agent unspecified)
Comparison: 06/19/2019

CLINICAL DATA: Shoulder pain for 6 months. Prior rotator cuff
repair.

EXAM:
MR ARTHROGRAM OF THE RIGHT SHOULDER
TECHNIQUE: Multiplanar, multisequence MR imaging of the right shoulder was
performed following the administration of intra-articular contrast.
CONTRAST:  See Injection Documentation.

[Series 5: T1 fat-sat · axial · 3.0mm · 0.30mm/px · z∈[-13,+58]mm · 3 of 28 slices shown]
[im 4/28]
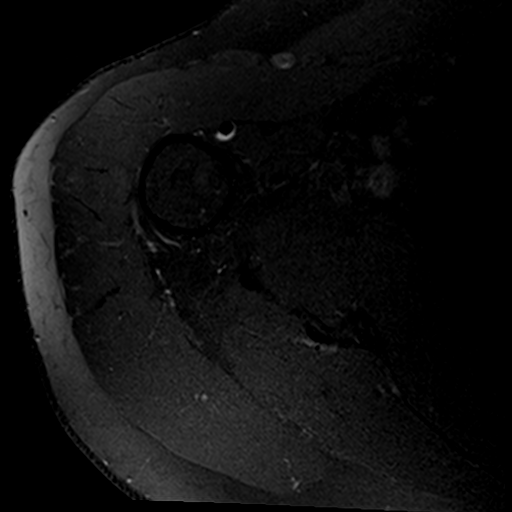
[im 14/28]
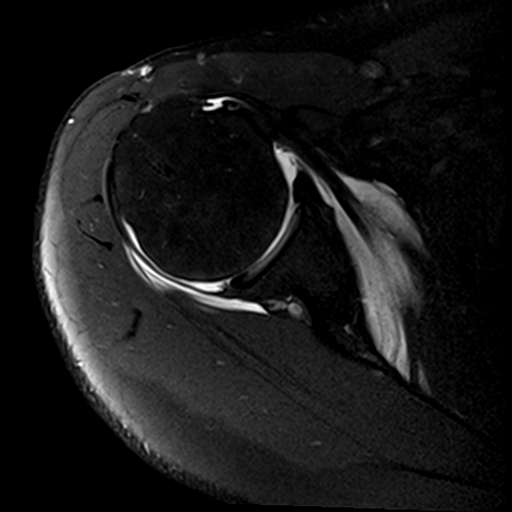
[im 24/28]
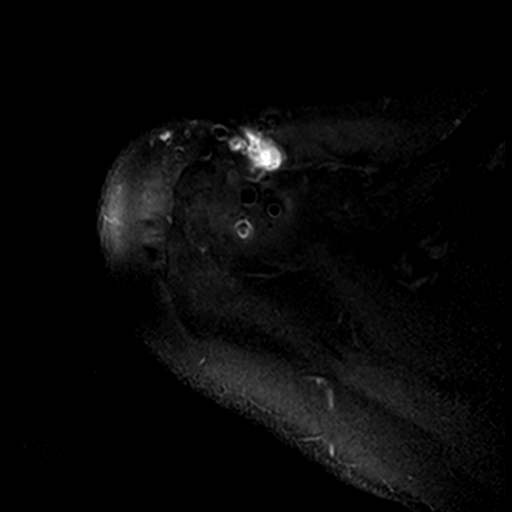

[Series 6: T1 · oblique · 3.0mm · 0.29mm/px · 3 of 24 slices shown]
[im 4/24]
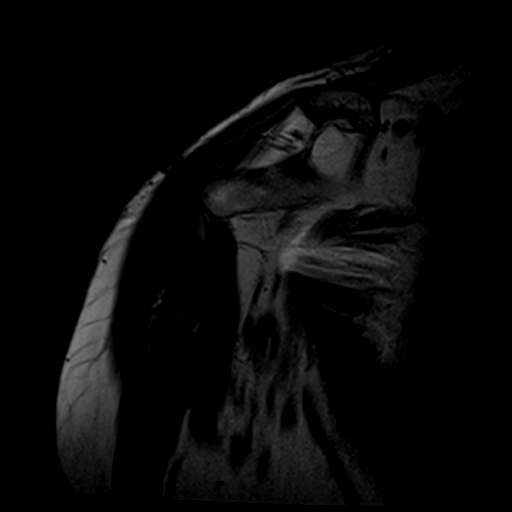
[im 12/24]
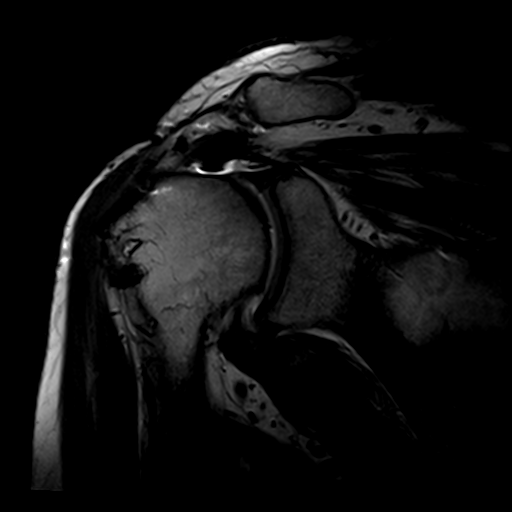
[im 20/24]
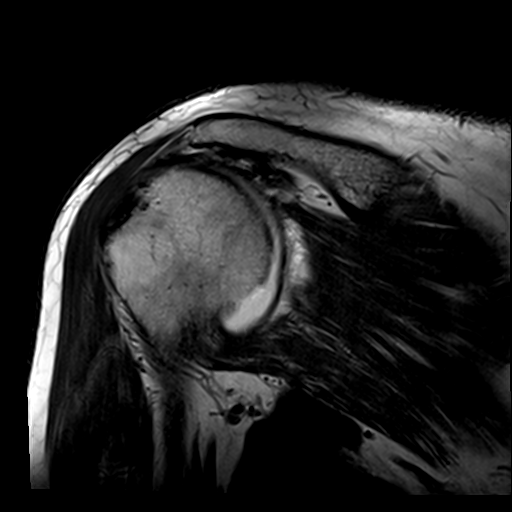

[Series 7: T2 fat-sat · oblique · 3.0mm · 0.31mm/px · 8 of 26 slices shown (1 of 2)]
[im 1/26]
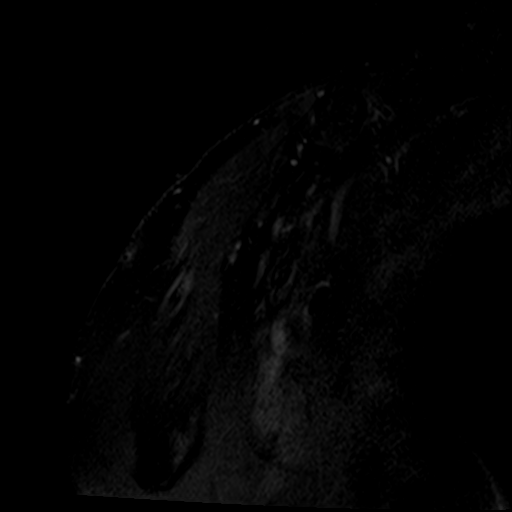
[im 4/26]
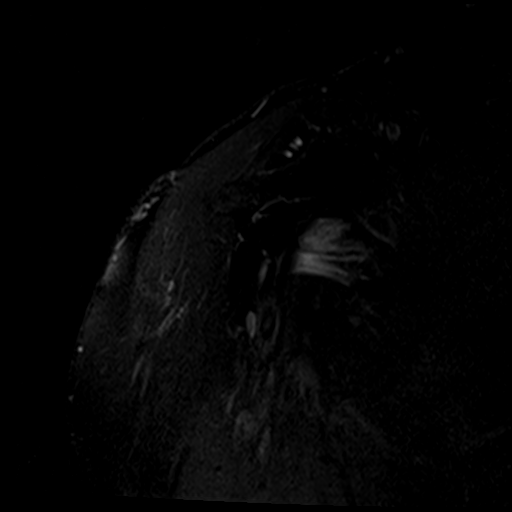
[im 8/26]
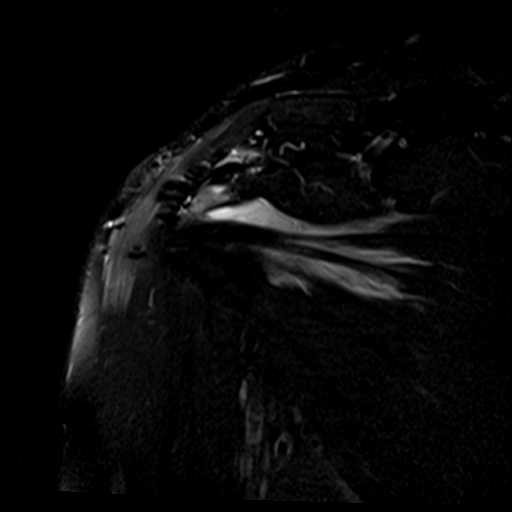
[im 11/26]
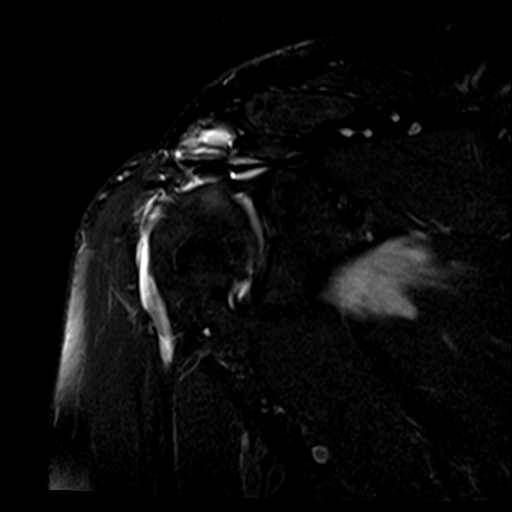
[im 15/26]
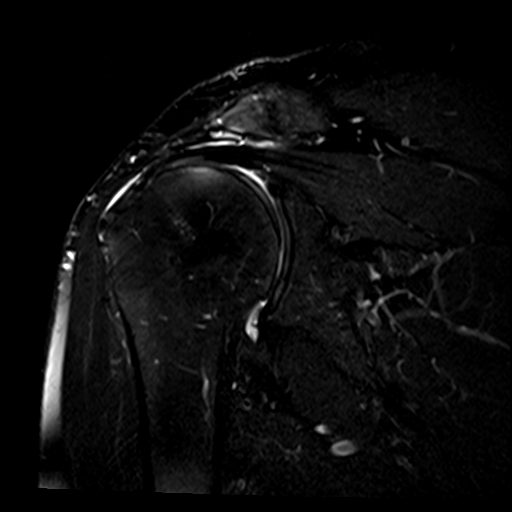
[im 18/26]
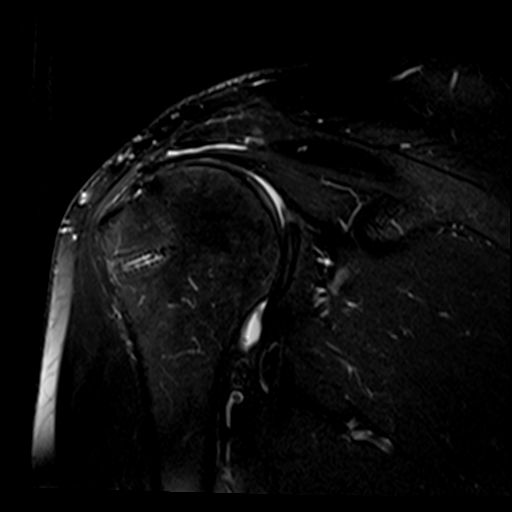
[im 22/26]
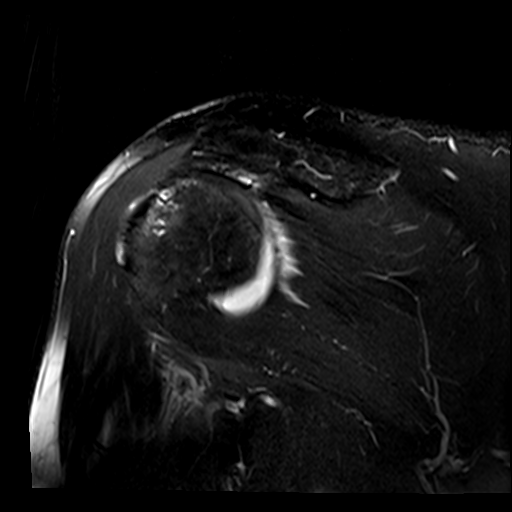
[im 26/26]
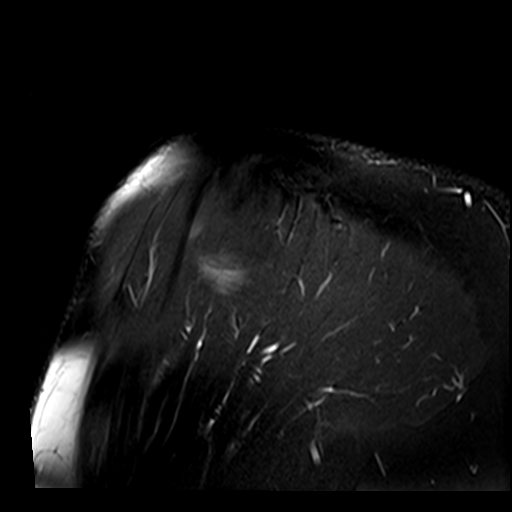

[Series 9: T2 fat-sat · oblique · 3.0mm · 0.29mm/px · 5 of 26 slices shown (2 of 2)]
[im 1/26]
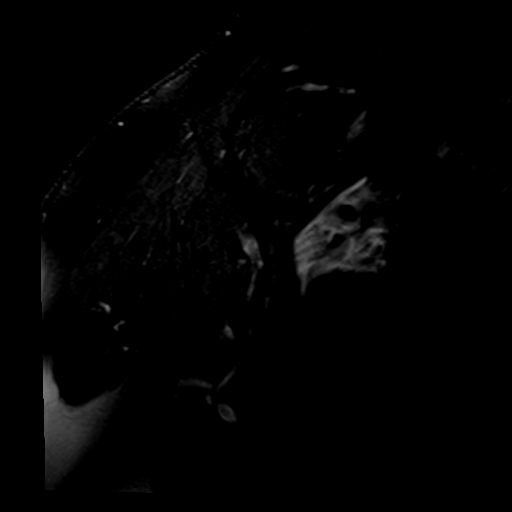
[im 4/26]
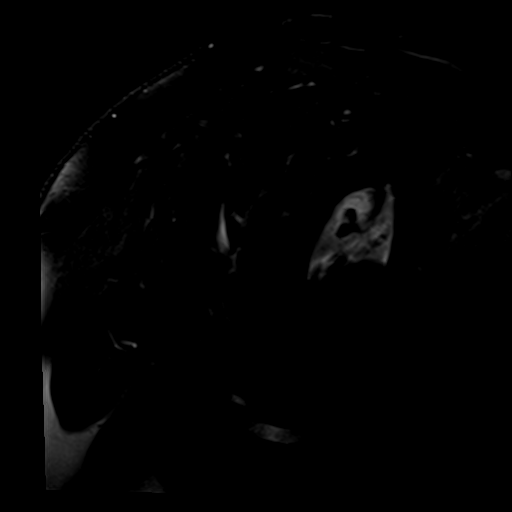
[im 8/26]
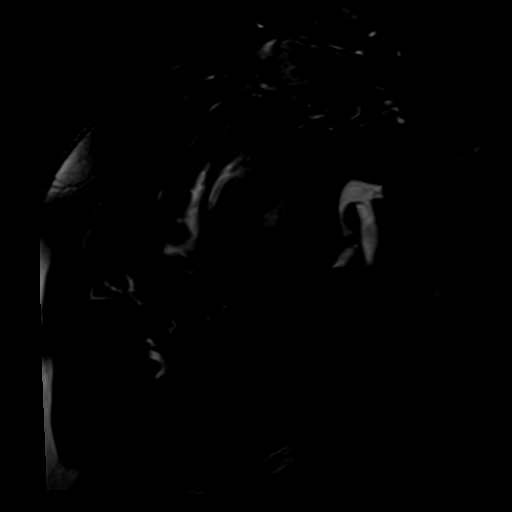
[im 15/26]
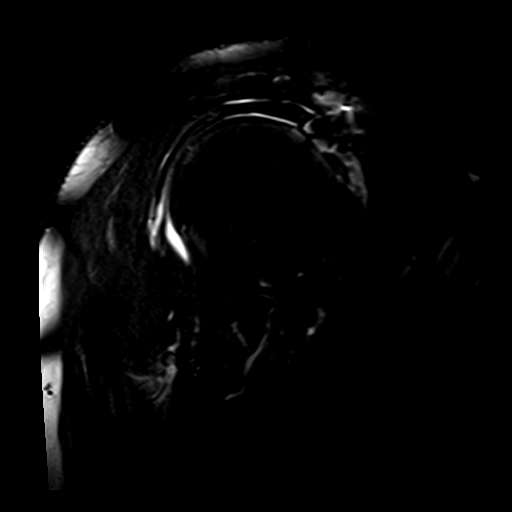
[im 22/26]
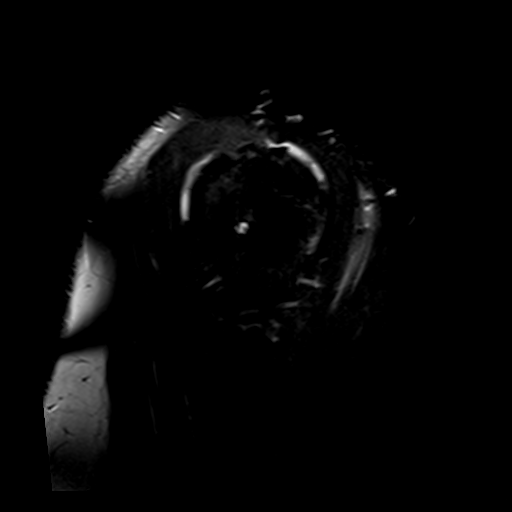

[19 of 40 positions shown; findings below may reference images not displayed]

FINDINGS: Rotator cuff: Prior rotator cuff repair with susceptibility artifact
around the rotator cuff insertion within the soft tissues.
Supraspinatus and infraspinatus tendon repair with mild thinning of
the rotator cuff but otherwise intact. Teres minor tendon is intact.
Subscapularis tendon is intact.

Muscles: No atrophy or fatty replacement of nor abnormal signal
within, the muscles of the rotator cuff.

Biceps long head: Mild tendinosis and attenuation of the
intra-articular portion of long head of the biceps tendon.

Acromioclavicular Joint: Mild arthropathy of the acromioclavicular
joint. Type I acromion. Small amount of subacromial/subdeltoid
bursal fluid.

Glenohumeral Joint: Intraarticular contrast mildly distending the
joint capsule. Partial-thickness cartilage loss of the glenohumeral
joint with small areas of high-grade partial-thickness cartilage
loss along the inferior aspect of the humeral head. Normal
glenohumeral ligaments.

Labrum: Intact.

Bones: No acute osseous abnormality. No aggressive osseous lesion.
IMPRESSION: 1. Prior rotator cuff repair with mild thinning of the rotator cuff
but otherwise intact.
2. Mild tendinosis and attenuation of the intra-articular portion of
long head of the biceps tendon.
3. Mild osteoarthritis of the glenohumeral joint.

## 2020-04-22 ENCOUNTER — Telehealth: Payer: Self-pay | Admitting: Orthopedic Surgery

## 2020-04-22 NOTE — Telephone Encounter (Signed)
Beth from [facility] wanted our office and Dr Aline Brochure to know that Mario Meyer has canceled his last 2 PT visits that were scheduled for Friday, 04/19/20 and today.  He has only been seen by them for the eval.  She said he does have another appointment this coming Wednesday but she just wanted [place] to know about the other canceled appointments.

## 2020-04-24 DIAGNOSIS — Z9889 Other specified postprocedural states: Secondary | ICD-10-CM | POA: Insufficient documentation

## 2020-04-26 ENCOUNTER — Ambulatory Visit (INDEPENDENT_AMBULATORY_CARE_PROVIDER_SITE_OTHER): Payer: 59 | Admitting: Orthopedic Surgery

## 2020-04-26 ENCOUNTER — Other Ambulatory Visit: Payer: Self-pay

## 2020-04-26 ENCOUNTER — Encounter: Payer: Self-pay | Admitting: Orthopedic Surgery

## 2020-04-26 DIAGNOSIS — Z9889 Other specified postprocedural states: Secondary | ICD-10-CM

## 2020-04-26 NOTE — Patient Instructions (Signed)
Continue PT

## 2020-04-26 NOTE — Progress Notes (Signed)
Chief Complaint  Patient presents with  . Follow-up    Recheck on right shoulder, 08-31-19   Status post manipulation under anesthesia after rotator cuff repair last September  Patient's range of motion passively was 120 degrees of flexion 80 degrees of abduction  He is doing his therapy therapy at physical therapy hand and rehab specialist downstairs progressing slowly  Patient encouraged to continue his home exercises and therapy and follow-up in 5 weeks  Encounter Diagnosis  Name Primary?  . Status post right rotator cuff repair// MUA 04/16/20 Yes

## 2020-05-24 ENCOUNTER — Encounter: Payer: Self-pay | Admitting: Emergency Medicine

## 2020-05-24 ENCOUNTER — Other Ambulatory Visit: Payer: Self-pay

## 2020-05-24 ENCOUNTER — Ambulatory Visit
Admission: EM | Admit: 2020-05-24 | Discharge: 2020-05-24 | Disposition: A | Discharge status: Home / Self Care | Payer: 59 | Attending: Emergency Medicine | Admitting: Emergency Medicine

## 2020-05-24 DIAGNOSIS — J069 Acute upper respiratory infection, unspecified: Secondary | ICD-10-CM

## 2020-05-24 DIAGNOSIS — Z1152 Encounter for screening for COVID-19: Secondary | ICD-10-CM | POA: Diagnosis not present

## 2020-05-24 MED ORDER — PREDNISONE 10 MG PO TABS
20.0000 mg | ORAL_TABLET | Freq: Every day | ORAL | 0 refills | Status: DC
Start: 2020-05-24 — End: 2020-07-08

## 2020-05-24 NOTE — Discharge Instructions (Addendum)
COVID testing ordered.  It will take between 2-7 days for test results.  Someone will contact you regarding abnormal results.    In the meantime: You should remain isolated in your home for 10 days from symptom onset AND greater than 24 hours after symptoms resolution (absence of fever without the use of fever-reducing medication and improvement in respiratory symptoms), whichever is longer Get plenty of rest and push fluids Continue to use Tessalon Perles for cough Continue to use zyrtec for nasal congestion, runny nose, and/or sore throat Continue with Mucinex Continue to use flonase for nasal congestion and runny nose Prednisone was prescribed/take as directed Use medications daily for symptom relief Use OTC medications like ibuprofen or tylenol as needed fever or pain Call or go to the ED if you have any new or worsening symptoms such as fever, worsening cough, shortness of breath, chest tightness, chest pain, turning blue, changes in mental status, etc..Marland Kitchen

## 2020-05-24 NOTE — ED Triage Notes (Signed)
Cough, body aches and sinus congestion x 3-4 days.  Had neg rapid covid done yesterday.   Pt has taken zyrtec and mucinex this morning and does not feel any better.

## 2020-05-24 NOTE — ED Provider Notes (Signed)
[place]   [id number] 05/24/20 Arrival Time: 1104   Chief Complaint  Patient presents with  . Cough    SUBJECTIVE: History from: patient.  Mario Meyer is a 59 y.o. male who presents the urgent care for complaint of cough, body ache and sinus congestion for the past 3 to 4 days.  Has tried Zyrtec and Mucinex with mild relief..  Denies sick exposure to COVID, flu or strep.  Denies recent travel.   Denies previous symptoms in the past.   Denies fever, chills, fatigue, sinus pain, rhinorrhea, sore throat, SOB, wheezing, chest pain, nausea, changes in bowel or bladder habits.    Denies fever, chills, fatigue, otalgia, sinus pain, nasal congestion, rhinorrhea, sore throat, cough, SOB, wheezing, chest pain, nausea, changes in bowel or bladder habits.     Received flu shot this year: no.  ROS: As per HPI.  All other pertinent ROS negative.     Past Medical History:  Diagnosis Date  . Panic attack    Past Surgical History:  Procedure Laterality Date  . COLONOSCOPY N/A 10/04/2015   Procedure: COLONOSCOPY;  Surgeon: Daneil Dolin, MD;  [facility];  Service: Endoscopy;  Laterality: N/A;  7:30 AM  . KNEE ARTHROSCOPY W/ ACL RECONSTRUCTION Right   . RIGHT/LEFT HEART CATH AND CORONARY ANGIOGRAPHY N/A 02/23/2019   Procedure: RIGHT/LEFT HEART CATH AND CORONARY ANGIOGRAPHY;  Surgeon: Belva Crome, MD;  Location: [facility];  Service: Cardiovascular;  Laterality: N/A;  . rotator cuff surgery Right   . SHOULDER ARTHROSCOPY WITH OPEN ROTATOR CUFF REPAIR Right 08/31/2019   Procedure: RIGHT SHOULDER ARTHROSCOPY with open rotator cuff repair;  Surgeon: Carole Civil, MD;  Location: [facility];  Service: Orthopedics;  Laterality: Right;  . SHOULDER CLOSED REDUCTION Right 04/16/2020   Procedure: CLOSED MANIPULATION RIGHT SHOULDER;  Surgeon: Carole Civil, MD;  Location: [facility];  Service: Orthopedics;  Laterality: Right;   No Known Allergies No current  facility-administered medications on file prior to encounter.   Current Outpatient Medications on File Prior to Encounter  Medication Sig Dispense Refill  . aspirin EC 81 MG tablet Take 81 mg by mouth daily.    Marland Kitchen oxyCODONE-acetaminophen (PERCOCET) 7.5-325 MG tablet Take 1 tablet by mouth every 4 (four) hours as needed for severe pain. 42 tablet 0   Social History   Socioeconomic History  . Marital status: Married    Spouse name: Not on file  . Number of children: Not on file  . Years of education: Not on file  . Highest education level: Not on file  Occupational History  . Not on file  Tobacco Use  . Smoking status: Never Smoker  . Smokeless tobacco: Never Used  Vaping Use  . Vaping Use: Never used  Substance and Sexual Activity  . Alcohol use: No  . Drug use: No  . Sexual activity: Yes  Other Topics Concern  . Not on file  Social History Narrative  . Not on file   Social Determinants of Health   Financial Resource Strain:   . Difficulty of Paying Living Expenses:   Food Insecurity:   . Worried About Charity fundraiser in the Last Year:   . Arboriculturist in the Last Year:   Transportation Needs:   . Film/video editor (Medical):   Marland Kitchen Lack of Transportation (Non-Medical):   Physical Activity:   . Days of Exercise per Week:   . Minutes of Exercise per  Session:   Stress:   . Feeling of Stress :   Social Connections:   . Frequency of Communication with Friends and Family:   . Frequency of Social Gatherings with Friends and Family:   . Attends Religious Services:   . Active Member of Clubs or Organizations:   . Attends Archivist Meetings:   Marland Kitchen Marital Status:   Intimate Partner Violence:   . Fear of Current or Ex-Partner:   . Emotionally Abused:   Marland Kitchen Physically Abused:   . Sexually Abused:    Family History  Problem Relation Age of Onset  . Cancer Mother     OBJECTIVE:  Vitals:   05/24/20 1126 05/24/20 1127  BP:  134/80  Pulse:  67    Resp:  18  Temp:  98.1 F (36.7 C)  TempSrc:  Oral  SpO2:  93%  Weight: 210 lb (95.3 kg)   Height: 6\' 1"  (1.854 m)      General appearance: alert; appears fatigued, but nontoxic; speaking in full sentences and tolerating own secretions HEENT: NCAT; Ears: EACs clear, TMs pearly gray; Eyes: PERRL.  EOM grossly intact. Sinuses: nontender; Nose: nares patent without rhinorrhea, Throat: oropharynx clear, tonsils non erythematous or enlarged, uvula midline  Neck: supple without LAD Lungs: unlabored respirations, symmetrical air entry; cough: moderate; no respiratory distress; CTAB Heart: regular rate and rhythm.  Radial pulses 2+ symmetrical bilaterally Skin: warm and dry Psychological: alert and cooperative; normal mood and affect  LABS:  No results found for this or any previous visit (from the past 24 hour(s)).   ASSESSMENT & PLAN:  1. URI with cough and congestion   2. Encounter for screening for COVID-19     Meds ordered this encounter  Medications  . predniSONE (DELTASONE) 10 MG tablet    Sig: Take 2 tablets (20 mg total) by mouth daily.    Dispense:  15 tablet    Refill:  0   Discharge instructions  COVID testing ordered.  It will take between 2-7 days for test results.  Someone will contact you regarding abnormal results.    In the meantime: You should remain isolated in your home for 10 days from symptom onset AND greater than 24 hours after symptoms resolution (absence of fever without the use of fever-reducing medication and improvement in respiratory symptoms), whichever is longer Get plenty of rest and push fluids Continue to use Tessalon Perles for cough Continue to use zyrtec for nasal congestion, runny nose, and/or sore throat Continue to use flonase for nasal congestion and runny nose Prednisone was prescribed/take as directed Use medications daily for symptom relief Use OTC medications like ibuprofen or tylenol as needed fever or pain Call or go to the ED  if you have any new or worsening symptoms such as fever, worsening cough, shortness of breath, chest tightness, chest pain, turning blue, changes in mental status, etc...   Reviewed expectations re: course of current medical issues. Questions answered. Outlined signs and symptoms indicating need for more acute intervention. Patient verbalized understanding. After Visit Summary given.         Emerson Monte, FNP 05/24/20 1204

## 2020-05-25 LAB — NOVEL CORONAVIRUS, NAA: SARS-CoV-2, NAA: NOT DETECTED

## 2020-05-25 LAB — SARS-COV-2, NAA 2 DAY TAT

## 2020-06-03 ENCOUNTER — Other Ambulatory Visit: Payer: Self-pay

## 2020-06-03 ENCOUNTER — Encounter: Payer: Self-pay | Admitting: Orthopedic Surgery

## 2020-06-03 ENCOUNTER — Ambulatory Visit (INDEPENDENT_AMBULATORY_CARE_PROVIDER_SITE_OTHER): Payer: 59 | Admitting: Orthopedic Surgery

## 2020-06-03 DIAGNOSIS — Z9889 Other specified postprocedural states: Secondary | ICD-10-CM

## 2020-06-03 NOTE — Progress Notes (Signed)
Chief Complaint  Patient presents with  . Post-op Follow-up    right MUA shoulder 04/16/20     6 weeks post MUA right shoulder s/p rcr rt shoulder 08/31/2019  C/o pain posterior shoulder pain and pain over the coracoid   He has active ROM of 80 abduction and flexion with PROM 130   He says therapist NOT doing PROM   He wants to change therapy sites, I m agreeable to that  Encounter Diagnosis  Name Primary?  . Status post right rotator cuff repair// MUA 04/16/20 Yes     rec change therapy to [facility]  If no improvement in 4 weeks repeat surgery

## 2020-07-08 ENCOUNTER — Other Ambulatory Visit: Payer: Self-pay

## 2020-07-08 ENCOUNTER — Ambulatory Visit (INDEPENDENT_AMBULATORY_CARE_PROVIDER_SITE_OTHER): Payer: 59 | Admitting: Orthopedic Surgery

## 2020-07-08 ENCOUNTER — Encounter: Payer: Self-pay | Admitting: Orthopedic Surgery

## 2020-07-08 DIAGNOSIS — Z9889 Other specified postprocedural states: Secondary | ICD-10-CM

## 2020-07-08 NOTE — Patient Instructions (Signed)
Schedule surgery right shoulder

## 2020-07-08 NOTE — Progress Notes (Signed)
Chief Complaint  Patient presents with   Routine Post Op    04/16/20 MUA improving some with therapy    Encounter Diagnoses  Name Primary?   Status post right rotator cuff repair// MUA 04/16/20    S/P arthroscopy of right shoulder  biceps tenodesis 08/31/19    History of failed repair of rotator cuff Yes    59 year old male had a rotator cuff repair followed by manipulation under anesthesia with manipulation occurring on Apr 16, 2020.  Initial index surgery was 08/31/2019  Patient has failed to progress in terms of range of motion and strength and still has a lot of pain over the bicipital groove   : Past Medical History:  Diagnosis Date   Panic attack    Past Surgical History:  Procedure Laterality Date   COLONOSCOPY N/A 10/04/2015   Procedure: COLONOSCOPY;  Surgeon: Daneil Dolin, MD;  [facility];  Service: Endoscopy;  Laterality: N/A;  7:30 AM   KNEE ARTHROSCOPY W/ ACL RECONSTRUCTION Right    RIGHT/LEFT HEART CATH AND CORONARY ANGIOGRAPHY N/A 02/23/2019   Procedure: RIGHT/LEFT HEART CATH AND CORONARY ANGIOGRAPHY;  Surgeon: Belva Crome, MD;  Location: [facility];  Service: Cardiovascular;  Laterality: N/A;   rotator cuff surgery Right    SHOULDER ARTHROSCOPY WITH OPEN ROTATOR CUFF REPAIR Right 08/31/2019   Procedure: RIGHT SHOULDER ARTHROSCOPY with open rotator cuff repair;  Surgeon: Carole Civil, MD;  Location: [facility];  Service: Orthopedics;  Laterality: Right;   SHOULDER CLOSED REDUCTION Right 04/16/2020   Procedure: CLOSED MANIPULATION RIGHT SHOULDER;  Surgeon: Carole Civil, MD;  Location: [facility];  Service: Orthopedics;  Laterality: Right;   Social History   Tobacco Use   Smoking status: Never Smoker   Smokeless tobacco: Never Used  Vaping Use   Vaping Use: Never used  Substance Use Topics   Alcohol use: No   Drug use: No   Physical Exam Vitals reviewed.  Constitutional:      Appearance: Normal appearance. He is  normal weight.  HENT:     Head: Normocephalic and atraumatic.     Nose: Nose normal. No congestion or rhinorrhea.     Mouth/Throat:     Mouth: Mucous membranes are moist.  Eyes:     General: No scleral icterus.    Extraocular Movements: Extraocular movements intact.     Conjunctiva/sclera: Conjunctivae normal.     Pupils: Pupils are equal, round, and reactive to light.  Cardiovascular:     Rate and Rhythm: Normal rate.     Pulses: Normal pulses.  Musculoskeletal:     Cervical back: Normal range of motion.     Comments: Right shoulder:  2 surgical scars 1 in Langer's lines and one classic scar from the coracoid to the lateral acromion.  Both surgical scars healed well  The patient has tenderness over the bicipital groove active range of motion is 110 degrees of flexion and 40 of external rotation with weakness in abduction and flexion   Skin:    General: Skin is warm and dry.     Coloration: Skin is not jaundiced or pale.     Findings: No bruising or erythema.  Neurological:     General: No focal deficit present.     Mental Status: He is alert and oriented to person, place, and time.  Psychiatric:        Mood and Affect: Mood normal.        Behavior: Behavior normal.  Thought Content: Thought content normal.        Judgment: Judgment normal.    Encounter Diagnoses  Name Primary?   Status post right rotator cuff repair// MUA 04/16/20    S/P arthroscopy of right shoulder  biceps tenodesis 08/31/19    History of failed repair of rotator cuff Yes    The procedure has been fully reviewed with the patient; The risks and benefits of surgery have been discussed and explained and understood. Alternative treatment has also been reviewed, questions were encouraged and answered. The postoperative plan is also been reviewed.  Recommend procedure open revision rotator cuff repair and biceps tenodesis right shoulder

## 2020-07-08 NOTE — Addendum Note (Signed)
Addended byCandice Camp on: 07/08/2020 01:19 PM   Modules accepted: Orders, SmartSet

## 2020-07-09 ENCOUNTER — Encounter: Payer: Self-pay | Admitting: Orthopedic Surgery

## 2020-07-10 ENCOUNTER — Telehealth: Payer: Self-pay | Admitting: Radiology

## 2020-07-10 NOTE — Telephone Encounter (Signed)
Started a prior authorization request for his shoulder surgery scheduled for Aug 17 /online with [facility]

## 2020-07-15 ENCOUNTER — Other Ambulatory Visit: Payer: Self-pay | Admitting: Orthopedic Surgery

## 2020-07-17 NOTE — Patient Instructions (Signed)
Mario Meyer  07/17/2020     @PREFPERIOPPHARMACY @   Your procedure is scheduled on  07/23/2020.  Report to [name] at  [phone]  A.M.  Call this number if you have problems the morning of surgery:  [phone]   Remember:  Do not eat or drink after midnight.                     Take these medicines the morning of surgery with A SIP OF WATER  None    Do not wear jewelry, make-up or nail polish.  Do not wear lotions, powders, or perfumes, or deodorant. Please brush your teeth.  Do not shave 48 hours prior to surgery.  Men may shave face and neck.  Do not bring valuables to the hospital.  [facility] is not responsible for any belongings or valuables.  Contacts, dentures or bridgework may not be worn into surgery.  Leave your suitcase in the car.  After surgery it may be brought to your room.  For patients admitted to the hospital, discharge time will be determined by your treatment team.  Patients discharged the day of surgery will not be allowed to drive home.   Name and phone number of your driver:   family Special instructions:  DO NOT smoke the morning of your procedure.  Please read over the following fact sheets that you were given. Anesthesia Post-op Instructions and Care and Recovery After Surgery       Surgery for Rotator Cuff Tear, Care After This sheet gives you information about how to care for yourself after your procedure. Your health care provider may also give you more specific instructions. If you have problems or questions, contact your health care provider. What can I expect after the procedure? After the procedure, it is common to have:  Swelling.  Pain.  Stiffness.  Tenderness. Follow these instructions at home: If you have a sling or a shoulder immobilizer:  Wear it as told by your health care provider. Remove it only as told by your health care provider.  Loosen it if your fingers tingle, become numb, or turn cold and  blue.  Keep it clean. Bathing  Do not take baths, swim, or use a hot tub until your health care provider approves. Ask your health care provider if you may take showers. You may only be allowed to take sponge baths.  Keep your bandage (dressing) dry until your health care provider says it can be removed.  If your sling or shoulder immobilizer is not waterproof: ? Do not let it get wet. ? Remove it when you take a bath or shower as told by your health care provider. Once the sling or shoulder immobilizer is removed, try not to move your shoulder until your health care provider says that you can. Incision care   Follow instructions from your health care provider about how to take care of your incision. Make sure you: ? Wash your hands with soap and water before and after you change your dressing. If soap and water are not available, use hand sanitizer. ? Change your dressing as told by your health care provider. ? Leave stitches (sutures), skin glue, or adhesive strips in place. These skin closures may need to stay in place for 2 weeks or longer. If adhesive strip edges start to loosen and curl up, you may trim the loose edges. Do not remove adhesive strips completely  unless your health care provider tells you to do that.  Check your incision area every day for signs of infection. Check for: ? More redness, swelling, or pain. ? More fluid or blood. ? Warmth. ? Pus or a bad smell. Managing pain, stiffness, and swelling   If directed, put ice on your shoulder area. ? Put ice in a plastic bag. ? Place a towel between your skin and the bag. ? Leave the ice on for 20 minutes, 2-3 times a day.  Move your fingers often to reduce stiffness and swelling.  Raise (elevate) your upper body on pillows when you lie down and when you sleep. ? Do not sleep on the front of your body (abdomen). ? Do not sleep on the side that your surgery was performed on. Medicines  Take over-the-counter and  prescription medicines only as told by your health care provider.  Ask your health care provider if the medicine prescribed to you: ? Requires you to avoid driving or using heavy machinery. ? Can cause constipation. You may need to take actions to prevent or treat constipation, such as:  Drink enough fluid to keep your urine pale yellow.  Take over-the-counter or prescription medicines.  Eat foods that are high in fiber, such as beans, whole grains, and fresh fruits and vegetables.  Limit foods that are high in fat and processed sugars, such as fried or sweet foods. Driving  Do not drive for 24 hours if you were given a sedative during your procedure.  Do not drive while wearing a sling or a shoulder immobilizer. Ask your health care provider when it is safe to drive. Activity  Do not use your arm to support your body weight until your health care provider says that you can.  Do not lift or hold anything with your arm until your health care provider approves.  Return to your normal activities as told by your health care provider. Ask your health care provider what activities are safe for you.  Do exercises as told by your health care provider. General instructions  Do not use any products that contain nicotine or tobacco, such as cigarettes, e-cigarettes, and chewing tobacco. These can delay healing after surgery. If you need help quitting, ask your health care provider.  Keep all follow-up visits as told by your health care provider. This is important. Contact a health care provider if:  You have a fever.  You have more redness, swelling, or pain around your incision.  You have more fluid or blood coming from your incision.  Your incision feels warm to the touch.  You have pus or a bad smell coming from your incision.  You have pain that gets worse or does not get better with medicine. Get help right away if:  You have severe pain.  You lose feeling in your arm or  hand.  Your hand or fingers turn very pale or blue. Summary  If you have a sling, wear it as told by your health care provider. Remove it only as told by your health care provider.  Change your dressing as told by your health care provider. Check the incision area every day for signs of infection.  If directed, put ice on your shoulder area 2-3 times a day.  Do not use your arm to lift anything or to support your body weight until your health care provider says that you can. This information is not intended to replace advice given to you by your health care  provider. Make sure you discuss any questions you have with your health care provider. Document Revised: 08/29/2018 Document Reviewed: 08/31/2018 Elsevier Patient Education  [place] C[facility] Tear Rehab After Surgery Ask your health care provider which exercises are safe for you. Do exercises exactly as told by your health care provider and adjust them as directed. It is normal to feel mild stretching, pulling, tightness, or discomfort as you do these exercises. Stop right away if you feel sudden pain or your pain gets worse. Do not begin these exercises until told by your health care provider. Stretching and range-of-motion exercises These exercises warm up your muscles and joints and improve the movement and flexibility of your shoulder. These exercises also help to relieve pain. Shoulder pendulum In this exercise, you let the injured arm dangle toward the floor and then swing it like a clock pendulum. 1. Stand near a table or counter that you can hold onto for balance. 2. Bend forward at the waist and let your left / right arm hang straight down. Use your other arm to support you and help you stay balanced. 3. Relax your left / right arm and shoulder muscles, and move your hips and your trunk so your left / right arm swings freely. Your arm should swing because of the motion of your body, not because you are using your  arm or shoulder muscles. 4. Keep moving your hips and trunk so your arm swings in the following directions, as told by your health care provider: ? Side to side. ? Forward and backward. ? In clockwise and counterclockwise circles. 5. Slowly return to the starting position. Repeat __________ times, or for __________ seconds per direction. Complete this exercise __________ times a day. Shoulder flexion, seated In this exercise, you raise your arm in front of your body until you feel a stretch in your injured shoulder. 1. Sit in a stable chair so your left / right forearm can rest on a flat surface. Your elbow should rest at a height that keeps your upper arm next to your body. 2. Keeping your left / right shoulder relaxed, lean forward at the waist and let your hand slide forward (flexion). Stop when you feel a stretch in your shoulder, or when you reach the angle that is recommended by your health care provider. 3. Hold for __________ seconds. 4. Slowly return to the starting position. Repeat __________ times. Complete this exercise __________ times a day. Shoulder flexion, standing In this exercise, you raise your arm in front of your body (flexion) until you feel a stretch in your injured shoulder. 1. Stand and hold a broomstick, a cane, or a similar object. Place your hands a little more than shoulder width apart on the object. Your left / right hand should be palm-up, and your other hand should be palm-down. 2. Keep your elbow straight and your shoulder muscles relaxed. Push the stick up with your healthy arm to raise your left / right arm in front of your body, and then over your head until you feel a stretch in your shoulder. ? Avoid shrugging your shoulder while you raise your arm. Keep your shoulder blade tucked down toward the middle of your back. ? Keep your left / right shoulder muscles relaxed. 3. Hold for __________ seconds. 4. Slowly return to the starting position. Repeat  __________ times. Complete this exercise __________ times a day. Shoulder abduction, active-assisted You will need a stick, broom handle, or similar object to help you (  assist) in doing this exercise. 1. Lie on your back. This is the supine position. Hold a broomstick, a cane, or a similar object. 2. Place your hands a little more than shoulder width apart on the object. Your left / right hand should be palm-up, and your other hand should be palm-down. 3. Keeping your shoulder relaxed, push the stick to raise your left / right arm out to your side (abduction) and then over your head. Use your other hand to help move the stick. Stop when you feel a stretch in your shoulder, or when you reach the angle that is recommended by your health care provider. ? Avoid shrugging your shoulder while you raise your arm. Keep your shoulder blade tucked down toward the middle of your back. 4. Hold for __________ seconds. 5. Slowly return to the starting position. Repeat __________ times. Complete this exercise __________ times a day. Shoulder flexion, active-assisted  1. Lie on your back. You may bend your knees for comfort. 2. Hold a broomstick, a cane, or a similar object so that your hands are about shoulder width apart. Your palms should face toward your feet. 3. Raise your left / right arm over your head, then behind your head toward the floor (flexion). Use your other hand to help you do this (active-assisted). Stop when you feel a gentle stretch in your shoulder, or when you reach the angle that is recommended by your health care provider. 4. Hold for __________ seconds. 5. Use the stick and your other arm to help you return your left / right arm to the starting position. Repeat __________ times. Complete this exercise __________ times a day. External rotation  1. Sit in a stable chair without armrests, or stand up. 2. Tuck a soft object, such as a folded towel or a small ball, under your left / right  upper arm. 3. Hold a broomstick, a cane, or a similar object with your palms face-down, toward the floor. Bend your elbows to a 90-degree angle (right angle), and keep your hands about shoulder width apart. 4. Straighten your healthy arm and push the stick across your body, toward your left / right side. Keep your left / right arm bent. This will rotate your left / right forearm away from your body (external rotation). 5. Hold for __________ seconds. 6. Slowly return to the starting position. Repeat __________ times. Complete this exercise __________ times a day. Strengthening exercises These exercises build strength and endurance in your shoulder. Endurance is the ability to use your muscles for a long time, even after they get tired. Shoulder flexion, isometric  1. Stand or sit in a doorway, facing the door frame. 2. Keep your left / right arm straight and make a gentle fist with your hand. Place your fist against the door frame. Only your fist should be touching the frame. Keep your upper arm at your side. 3. Gently press your fist against the door frame, as if you are trying to raise your arm above your head (isometric shoulder flexion). ? Avoid shrugging your shoulder while you press your hand into the door frame. Keep your shoulder blade tucked down toward the middle of your back. 4. Hold for __________ seconds. 5. Slowly release the tension, and relax your muscles completely before you repeat the exercise. Repeat __________ times. Complete this exercise __________ times a day. Shoulder abduction, isometric 1. Stand or sit in a doorway. Your left / right arm should be closest to the door frame. 2. Keep  your left / right arm straight, and place the back of your hand against the door frame. Only your hand should be touching the frame. Keep the rest of your arm close to your side. 3. Gently press the back of your hand against the door frame, as if you are trying to raise your arm out to the  side (isometric shoulder abduction). ? Avoid shrugging your shoulder while you press your hand into the door frame. Keep your shoulder blade tucked down toward the middle of your back. 4. Hold for __________ seconds. 5. Slowly release the tension, and relax your muscles completely before you repeat the exercise. Repeat __________ times. Complete this exercise __________ times a day. Internal rotation, isometric This is an exercise in which you press your palm against a door frame without moving your shoulder joint (isometric). 1. Stand or sit in a doorway, facing the door frame. 2. Bend your left / right elbow, and place the palm of your hand against the door frame. Only your palm should be touching the frame. Keep your upper arm at your side. 3. Gently press your hand against the door frame, as if you are trying to push your arm toward your abdomen (internal rotation). Gradually increase the pressure until you are pressing as hard as you can. Stop increasing the pressure if you feel shoulder pain. ? Avoid shrugging your shoulder while you press your hand into the door frame. Keep your shoulder blade tucked down toward the middle of your back. 4. Hold for __________ seconds. 5. Slowly release the tension, and relax your muscles completely before you repeat the exercise. Repeat __________ times. Complete this exercise __________ times a day. External rotation, isometric This is an exercise in which you press the back of your wrist against a door frame without moving your shoulder joint (isometric). 1. Stand or sit in a doorway, facing the door frame. 2. Bend your left / right elbow and place the back of your wrist against the door frame. Only the back of your wrist should be touching the frame. Keep your upper arm at your side. 3. Gently press your wrist against the door frame, as if you are trying to push your arm away from your abdomen (external rotation). Gradually increase the pressure until you  are pressing as hard as you can. Stop increasing the pressure if you feel pain. ? Avoid shrugging your shoulder while you press your wrist into the door frame. Keep your shoulder blade tucked down toward the middle of your back. 4. Hold for __________ seconds. 5. Slowly release the tension, and relax your muscles completely before you repeat the exercise. Repeat __________ times. Complete this exercise __________ times a day. This information is not intended to replace advice given to you by your health care provider. Make sure you discuss any questions you have with your health care provider. Document Revised: 03/17/2019 Document Reviewed: 03/07/2019 Elsevier Patient Education  [place] Anesthesia, Adult, Care After This sheet gives you information about how to care for yourself after your procedure. Your health care provider may also give you more specific instructions. If you have problems or questions, contact your health care provider. What can I expect after the procedure? After the procedure, the following side effects are common:  Pain or discomfort at the IV site.  Nausea.  Vomiting.  Sore throat.  Trouble concentrating.  Feeling cold or chills.  Weak or tired.  Sleepiness and fatigue.  Soreness and body aches. These  side effects can affect parts of the body that were not involved in surgery. Follow these instructions at home:  For at least 24 hours after the procedure:  Have a responsible adult stay with you. It is important to have someone help care for you until you are awake and alert.  Rest as needed.  Do not: ? Participate in activities in which you could fall or become injured. ? Drive. ? Use heavy machinery. ? Drink alcohol. ? Take sleeping pills or medicines that cause drowsiness. ? Make important decisions or sign legal documents. ? Take care of children on your own. Eating and drinking  Follow any instructions from your health  care provider about eating or drinking restrictions.  When you feel hungry, start by eating small amounts of foods that are soft and easy to digest (bland), such as toast. Gradually return to your regular diet.  Drink enough fluid to keep your urine pale yellow.  If you vomit, rehydrate by drinking water, juice, or clear broth. General instructions  If you have sleep apnea, surgery and certain medicines can increase your risk for breathing problems. Follow instructions from your health care provider about wearing your sleep device: ? Anytime you are sleeping, including during daytime naps. ? While taking prescription pain medicines, sleeping medicines, or medicines that make you drowsy.  Return to your normal activities as told by your health care provider. Ask your health care provider what activities are safe for you.  Take over-the-counter and prescription medicines only as told by your health care provider.  If you smoke, do not smoke without supervision.  Keep all follow-up visits as told by your health care provider. This is important. Contact a health care provider if:  You have nausea or vomiting that does not get better with medicine.  You cannot eat or drink without vomiting.  You have pain that does not get better with medicine.  You are unable to pass urine.  You develop a skin rash.  You have a fever.  You have redness around your IV site that gets worse. Get help right away if:  You have difficulty breathing.  You have chest pain.  You have blood in your urine or stool, or you vomit blood. Summary  After the procedure, it is common to have a sore throat or nausea. It is also common to feel tired.  Have a responsible adult stay with you for the first 24 hours after general anesthesia. It is important to have someone help care for you until you are awake and alert.  When you feel hungry, start by eating small amounts of foods that are soft and easy to  digest (bland), such as toast. Gradually return to your regular diet.  Drink enough fluid to keep your urine pale yellow.  Return to your normal activities as told by your health care provider. Ask your health care provider what activities are safe for you. This information is not intended to replace advice given to you by your health care provider. Make sure you discuss any questions you have with your health care provider. Document Revised: 11/26/2017 Document Reviewed: 07/09/2017 Elsevier Patient Education  [place]. How to Use Chlorhexidine for Bathing Chlorhexidine gluconate (CHG) is a germ-killing (antiseptic) solution that is used to clean the skin. It can get rid of the bacteria that normally live on the skin and can keep them away for about 24 hours. To clean your skin with CHG, you may be given:  A  CHG solution to use in the shower or as part of a sponge bath.  A prepackaged cloth that contains CHG. Cleaning your skin with CHG may help lower the risk for infection:  While you are staying in the intensive care unit of the hospital.  If you have a vascular access, such as a central line, to provide short-term or long-term access to your veins.  If you have a catheter to drain urine from your bladder.  If you are on a ventilator. A ventilator is a machine that helps you breathe by moving air in and out of your lungs.  After surgery. What are the risks? Risks of using CHG include:  A skin reaction.  Hearing loss, if CHG gets in your ears.  Eye injury, if CHG gets in your eyes and is not rinsed out.  The CHG product catching fire. Make sure that you avoid smoking and flames after applying CHG to your skin. Do not use CHG:  If you have a chlorhexidine allergy or have previously reacted to chlorhexidine.  On babies younger than 22 months of age. How to use CHG solution  Use CHG only as told by your health care provider, and follow the instructions on the  label.  Use the full amount of CHG as directed. Usually, this is one bottle. During a shower Follow these steps when using CHG solution during a shower (unless your health care provider gives you different instructions): 7. Start the shower. 8. Use your normal soap and shampoo to wash your face and hair. 9. Turn off the shower or move out of the shower stream. 10. Pour the CHG onto a clean washcloth. Do not use any type of brush or rough-edged sponge. 11. Starting at your neck, lather your body down to your toes. Make sure you follow these instructions: ? If you will be having surgery, pay special attention to the part of your body where you will be having surgery. Scrub this area for at least 1 minute. ? Do not use CHG on your head or face. If the solution gets into your ears or eyes, rinse them well with water. ? Avoid your genital area. ? Avoid any areas of skin that have broken skin, cuts, or scrapes. ? Scrub your back and under your arms. Make sure to wash skin folds. 12. Let the lather sit on your skin for 1-2 minutes or as long as told by your health care provider. 13. Thoroughly rinse your entire body in the shower. Make sure that all body creases and crevices are rinsed well. 14. Dry off with a clean towel. Do not put any substances on your body afterward--such as powder, lotion, or perfume--unless you are told to do so by your health care provider. Only use lotions that are recommended by the manufacturer. 15. Put on clean clothes or pajamas. 16. If it is the night before your surgery, sleep in clean sheets.  During a sponge bath Follow these steps when using CHG solution during a sponge bath (unless your health care provider gives you different instructions): 6. Use your normal soap and shampoo to wash your face and hair. 7. Pour the CHG onto a clean washcloth. 8. Starting at your neck, lather your body down to your toes. Make sure you follow these instructions: ? If you will be  having surgery, pay special attention to the part of your body where you will be having surgery. Scrub this area for at least 1 minute. ? Do not  use CHG on your head or face. If the solution gets into your ears or eyes, rinse them well with water. ? Avoid your genital area. ? Avoid any areas of skin that have broken skin, cuts, or scrapes. ? Scrub your back and under your arms. Make sure to wash skin folds. 9. Let the lather sit on your skin for 1-2 minutes or as long as told by your health care provider. 10. Using a different clean, wet washcloth, thoroughly rinse your entire body. Make sure that all body creases and crevices are rinsed well. 11. Dry off with a clean towel. Do not put any substances on your body afterward--such as powder, lotion, or perfume--unless you are told to do so by your health care provider. Only use lotions that are recommended by the manufacturer. 12. Put on clean clothes or pajamas. 13. If it is the night before your surgery, sleep in clean sheets. How to use CHG prepackaged cloths  Only use CHG cloths as told by your health care provider, and follow the instructions on the label.  Use the CHG cloth on clean, dry skin.  Do not use the CHG cloth on your head or face unless your health care provider tells you to.  When washing with the CHG cloth: ? Avoid your genital area. ? Avoid any areas of skin that have broken skin, cuts, or scrapes. Before surgery Follow these steps when using a CHG cloth to clean before surgery (unless your health care provider gives you different instructions): 6. Using the CHG cloth, vigorously scrub the part of your body where you will be having surgery. Scrub using a back-and-forth motion for 3 minutes. The area on your body should be completely wet with CHG when you are done scrubbing. 7. Do not rinse. Discard the cloth and let the area air-dry. Do not put any substances on the area afterward, such as powder, lotion, or perfume. 8. Put  on clean clothes or pajamas. 9. If it is the night before your surgery, sleep in clean sheets.  For general bathing Follow these steps when using CHG cloths for general bathing (unless your health care provider gives you different instructions). 6. Use a separate CHG cloth for each area of your body. Make sure you wash between any folds of skin and between your fingers and toes. Wash your body in the following order, switching to a new cloth after each step: ? The front of your neck, shoulders, and chest. ? Both of your arms, under your arms, and your hands. ? Your stomach and groin area, avoiding the genitals. ? Your right leg and foot. ? Your left leg and foot. ? The back of your neck, your back, and your buttocks. 7. Do not rinse. Discard the cloth and let the area air-dry. Do not put any substances on your body afterward--such as powder, lotion, or perfume--unless you are told to do so by your health care provider. Only use lotions that are recommended by the manufacturer. 8. Put on clean clothes or pajamas. Contact a health care provider if:  Your skin gets irritated after scrubbing.  You have questions about using your solution or cloth. Get help right away if:  Your eyes become very red or swollen.  Your eyes itch badly.  Your skin itches badly and is red or swollen.  Your hearing changes.  You have trouble seeing.  You have swelling or tingling in your mouth or throat.  You have trouble breathing.  You swallow any  chlorhexidine. Summary  Chlorhexidine gluconate (CHG) is a germ-killing (antiseptic) solution that is used to clean the skin. Cleaning your skin with CHG may help to lower your risk for infection.  You may be given CHG to use for bathing. It may be in a bottle or in a prepackaged cloth to use on your skin. Carefully follow your health care provider's instructions and the instructions on the product label.  Do not use CHG if you have a chlorhexidine  allergy.  Contact your health care provider if your skin gets irritated after scrubbing. This information is not intended to replace advice given to you by your health care provider. Make sure you discuss any questions you have with your health care provider. Document Revised: 02/09/2019 Document Reviewed: 10/21/2017 Elsevier Patient Education  [place].

## 2020-07-18 ENCOUNTER — Other Ambulatory Visit: Payer: Self-pay

## 2020-07-18 ENCOUNTER — Encounter (HOSPITAL_COMMUNITY)
Admission: RE | Admit: 2020-07-18 | Discharge: 2020-07-18 | Disposition: A | Discharge status: Home / Self Care | Payer: 59 | Source: Ambulatory Visit | Attending: Orthopedic Surgery | Admitting: Orthopedic Surgery

## 2020-07-18 DIAGNOSIS — Z01812 Encounter for preprocedural laboratory examination: Secondary | ICD-10-CM | POA: Diagnosis present

## 2020-07-18 LAB — CBC WITH DIFFERENTIAL/PLATELET
Abs Immature Granulocytes: 0.02 10*3/uL (ref 0.00–0.07)
Basophils Absolute: 0 10*3/uL (ref 0.0–0.1)
Basophils Relative: 1 %
Eosinophils Absolute: 0.2 10*3/uL (ref 0.0–0.5)
Eosinophils Relative: 3 %
HCT: 50.4 % (ref 39.0–52.0)
Hemoglobin: 16 g/dL (ref 13.0–17.0)
Immature Granulocytes: 0 %
Lymphocytes Relative: 31 %
Lymphs Abs: 2.3 10*3/uL (ref 0.7–4.0)
MCH: 27.4 pg (ref 26.0–34.0)
MCHC: 31.7 g/dL (ref 30.0–36.0)
MCV: 86.2 fL (ref 80.0–100.0)
Monocytes Absolute: 0.9 10*3/uL (ref 0.1–1.0)
Monocytes Relative: 12 %
Neutro Abs: 3.9 10*3/uL (ref 1.7–7.7)
Neutrophils Relative %: 53 %
Platelets: 273 10*3/uL (ref 150–400)
RBC: 5.85 MIL/uL — ABNORMAL HIGH (ref 4.22–5.81)
RDW: 13.2 % (ref 11.5–15.5)
WBC: 7.3 10*3/uL (ref 4.0–10.5)
nRBC: 0 % (ref 0.0–0.2)

## 2020-07-18 LAB — BASIC METABOLIC PANEL
Anion gap: 11 (ref 5–15)
BUN: 15 mg/dL (ref 6–20)
CO2: 22 mmol/L (ref 22–32)
Calcium: 8.9 mg/dL (ref 8.9–10.3)
Chloride: 106 mmol/L (ref 98–111)
Creatinine, Ser: 0.91 mg/dL (ref 0.61–1.24)
GFR calc Af Amer: >60 mL/min (ref >60)
GFR calc non Af Amer: >60 mL/min (ref >60)
Glucose, Bld: 82 mg/dL (ref 70–99)
Potassium: 4.3 mmol/L (ref 3.5–5.1)
Sodium: 139 mmol/L (ref 135–145)

## 2020-07-22 ENCOUNTER — Other Ambulatory Visit: Payer: Self-pay

## 2020-07-22 ENCOUNTER — Other Ambulatory Visit (HOSPITAL_COMMUNITY)
Admission: RE | Admit: 2020-07-22 | Discharge: 2020-07-22 | Disposition: A | Discharge status: Home / Self Care | Payer: 59 | Source: Ambulatory Visit | Attending: Orthopedic Surgery | Admitting: Orthopedic Surgery

## 2020-07-22 DIAGNOSIS — Z20822 Contact with and (suspected) exposure to covid-19: Secondary | ICD-10-CM | POA: Insufficient documentation

## 2020-07-22 DIAGNOSIS — Z01812 Encounter for preprocedural laboratory examination: Secondary | ICD-10-CM | POA: Diagnosis not present

## 2020-07-22 LAB — SARS CORONAVIRUS 2 (TAT 6-24 HRS): SARS Coronavirus 2: NEGATIVE

## 2020-07-22 NOTE — H&P (Addendum)
  Chief complaint pain weakness stiffness right shoulder  Mario Meyer is 59 years old he had a rotator cuff repair and arthroscopy of the right shoulder on September 24 of 2020 at that time he was found to have a Postel lesion and tendinitis of his biceps tendon primarily the intra-articular portion.  This was treated with debridement and completion of the tear and repair of the rotator cuff with 2 swivel locks medially fiber tape and 2 swivel locks laterally  The patient underwent physical therapy failed to progress actually developing adhesive capsulitis and then underwent manipulation under anesthesia on May 11 of 2021  He continued to have pain and stiffness especially in external rotation his abduction and flexion did not maintain improvement  After continuing attempts at rehab he and I both decided that he should have repeat surgery mainly to address his biceps tendon pathology and his stiffness  He had an MRI on March 06, 2020 which showed rotator cuff repair was intact there was continued biceps tendon tendinosis and thinning  Review of systems no evidence of chest pain shortness of breath numbness tingling  Review of Systems  All other systems reviewed and are negative.  Past Medical History:  Diagnosis Date  . Panic attack    Past Surgical History:  Procedure Laterality Date  . COLONOSCOPY N/A 10/04/2015   Procedure: COLONOSCOPY;  Surgeon: Daneil Dolin, MD;  Location: [facility];  Service: Endoscopy;  Laterality: N/A;  7:30 AM  . KNEE ARTHROSCOPY W/ ACL RECONSTRUCTION Right   . RIGHT/LEFT HEART CATH AND CORONARY ANGIOGRAPHY N/A 02/23/2019   Procedure: RIGHT/LEFT HEART CATH AND CORONARY ANGIOGRAPHY;  Surgeon: Belva Crome, MD;  Location: [facility];  Service: Cardiovascular;  Laterality: N/A;  . rotator cuff surgery Right   . SHOULDER ARTHROSCOPY WITH OPEN ROTATOR CUFF REPAIR Right 08/31/2019   Procedure: RIGHT SHOULDER ARTHROSCOPY with open rotator cuff repair;   Surgeon: Carole Civil, MD;  Location: [facility];  Service: Orthopedics;  Laterality: Right;  . SHOULDER CLOSED REDUCTION Right 04/16/2020   Procedure: CLOSED MANIPULATION RIGHT SHOULDER;  Surgeon: Carole Civil, MD;  Location: [facility];  Service: Orthopedics;  Laterality: Right;   Social History   Tobacco Use  . Smoking status: Never Smoker  . Smokeless tobacco: Never Used  Vaping Use  . Vaping Use: Never used  Substance Use Topics  . Alcohol use: No  . Drug use: No    General appearance patient is normal good body habitus normal weight.  He is awake alert and oriented x3 mood is pleasant affect is normal he has no gait abnormalities  His right shoulder shows a previous rotator cuff repair scar he is tender near the scar in the bicipital groove and anterior shoulder along the biceps tendon correlating with biceps tendon pathology has limited external rotation of about 35 degrees his abduction is just past 80 degrees and his flexion is just past 100 degrees.  There is weakness in the cuff but is difficult to tell because of limitation of motion is skin is otherwise intact pulses are good temperature is normal lymph nodes are negative sensation remains intact reflexes are good  Diagnosis chronic right shoulder pain with biceps tendinitis and possible retear of rotator cuff  Plan rotator cuff repair right shoulder open with biceps tenodesis with specific attention to the biceps tendon and previous rotator cuff repair

## 2020-07-23 ENCOUNTER — Ambulatory Visit (HOSPITAL_COMMUNITY): Payer: 59 | Admitting: Anesthesiology

## 2020-07-23 ENCOUNTER — Ambulatory Visit (HOSPITAL_COMMUNITY)
Admission: RE | Admit: 2020-07-23 | Discharge: 2020-07-23 | Disposition: A | Discharge status: Home / Self Care | Payer: 59 | Attending: Orthopedic Surgery | Admitting: Orthopedic Surgery

## 2020-07-23 ENCOUNTER — Encounter (HOSPITAL_COMMUNITY)
Admission: RE | Disposition: A | Discharge status: Home / Self Care | Payer: Self-pay | Source: Home / Self Care | Attending: Orthopedic Surgery

## 2020-07-23 ENCOUNTER — Encounter (HOSPITAL_COMMUNITY): Payer: Self-pay | Admitting: Orthopedic Surgery

## 2020-07-23 DIAGNOSIS — M7521 Bicipital tendinitis, right shoulder: Secondary | ICD-10-CM

## 2020-07-23 DIAGNOSIS — M1990 Unspecified osteoarthritis, unspecified site: Secondary | ICD-10-CM | POA: Diagnosis not present

## 2020-07-23 DIAGNOSIS — M7522 Bicipital tendinitis, left shoulder: Secondary | ICD-10-CM

## 2020-07-23 DIAGNOSIS — M75101 Unspecified rotator cuff tear or rupture of right shoulder, not specified as traumatic: Secondary | ICD-10-CM | POA: Diagnosis present

## 2020-07-23 HISTORY — PX: BICEPT TENODESIS: SHX5116

## 2020-07-23 SURGERY — TENODESIS, BICEPS
Anesthesia: General | Site: Shoulder | Laterality: Right

## 2020-07-23 MED ORDER — FENTANYL CITRATE (PF) 250 MCG/5ML IJ SOLN
INTRAMUSCULAR | Status: AC
  Filled 2020-07-23: qty 5

## 2020-07-23 MED ORDER — ONDANSETRON HCL 4 MG/2ML IJ SOLN
INTRAMUSCULAR | Status: DC | PRN
  Administered 2020-07-23: 4 mg via INTRAVENOUS

## 2020-07-23 MED ORDER — PROPOFOL 10 MG/ML IV BOLUS
INTRAVENOUS | Status: DC | PRN
  Administered 2020-07-23: 200 mg via INTRAVENOUS

## 2020-07-23 MED ORDER — PROPOFOL 10 MG/ML IV BOLUS
INTRAVENOUS | Status: AC
  Filled 2020-07-23: qty 20

## 2020-07-23 MED ORDER — DEXAMETHASONE SODIUM PHOSPHATE 10 MG/ML IJ SOLN
INTRAMUSCULAR | Status: DC | PRN
  Administered 2020-07-23: 10 mg via INTRAVENOUS

## 2020-07-23 MED ORDER — TIZANIDINE HCL 4 MG PO TABS
4.0000 mg | ORAL_TABLET | Freq: Every day | ORAL | 1 refills | Status: DC
Start: 2020-07-23 — End: 2020-08-21

## 2020-07-23 MED ORDER — IBUPROFEN 800 MG PO TABS
800.0000 mg | ORAL_TABLET | Freq: Three times a day (TID) | ORAL | 1 refills | Status: DC | PRN
Start: 2020-07-23 — End: 2020-08-23

## 2020-07-23 MED ORDER — SEVOFLURANE IN SOLN
RESPIRATORY_TRACT | Status: AC
  Filled 2020-07-23: qty 250

## 2020-07-23 MED ORDER — CHLORHEXIDINE GLUCONATE 0.12 % MT SOLN
15.0000 mL | Freq: Once | OROMUCOSAL | Status: AC
  Administered 2020-07-23: 15 mL via OROMUCOSAL
  Filled 2020-07-23: qty 15

## 2020-07-23 MED ORDER — DEXMEDETOMIDINE HCL IN NACL 200 MCG/50ML IV SOLN
INTRAVENOUS | Status: DC | PRN
Start: 2020-07-23 — End: 2020-07-23
  Administered 2020-07-23: 8 ug via INTRAVENOUS

## 2020-07-23 MED ORDER — ROCURONIUM BROMIDE 10 MG/ML (PF) SYRINGE
PREFILLED_SYRINGE | INTRAVENOUS | Status: AC
  Filled 2020-07-23: qty 10

## 2020-07-23 MED ORDER — LACTATED RINGERS IV SOLN: INTRAVENOUS | Status: DC | PRN

## 2020-07-23 MED ORDER — BUPIVACAINE-EPINEPHRINE (PF) 0.5% -1:200000 IJ SOLN
INTRAMUSCULAR | Status: AC
  Filled 2020-07-23: qty 30

## 2020-07-23 MED ORDER — MIDAZOLAM HCL 2 MG/2ML IJ SOLN
2.0000 mg | Freq: Once | INTRAMUSCULAR | Status: AC
  Administered 2020-07-23: 2 mg via INTRAVENOUS
  Filled 2020-07-23: qty 2

## 2020-07-23 MED ORDER — OXYCODONE-ACETAMINOPHEN 7.5-325 MG PO TABS: 1.0000 | ORAL_TABLET | ORAL | 0 refills | Status: AC | PRN

## 2020-07-23 MED ORDER — LIDOCAINE HCL (CARDIAC) PF 100 MG/5ML IV SOSY
PREFILLED_SYRINGE | INTRAVENOUS | Status: DC | PRN
  Administered 2020-07-23: 80 mg via INTRAVENOUS

## 2020-07-23 MED ORDER — SUCCINYLCHOLINE CHLORIDE 200 MG/10ML IV SOSY
PREFILLED_SYRINGE | INTRAVENOUS | Status: AC
  Filled 2020-07-23: qty 10

## 2020-07-23 MED ORDER — LACTATED RINGERS IV SOLN: Freq: Once | INTRAVENOUS | Status: AC

## 2020-07-23 MED ORDER — ONDANSETRON HCL 4 MG/2ML IJ SOLN
4.0000 mg | Freq: Four times a day (QID) | INTRAMUSCULAR | Status: DC
  Administered 2020-07-23: 4 mg via INTRAVENOUS
  Filled 2020-07-23: qty 2

## 2020-07-23 MED ORDER — PROMETHAZINE HCL 12.5 MG PO TABS
12.5000 mg | ORAL_TABLET | Freq: Four times a day (QID) | ORAL | 0 refills | Status: DC | PRN
Start: 2020-07-23 — End: 2020-08-23

## 2020-07-23 MED ORDER — 0.9 % SODIUM CHLORIDE (POUR BTL) OPTIME
TOPICAL | Status: DC | PRN
  Administered 2020-07-23: 1000 mL

## 2020-07-23 MED ORDER — DEXAMETHASONE SODIUM PHOSPHATE 10 MG/ML IJ SOLN
INTRAMUSCULAR | Status: AC
  Filled 2020-07-23: qty 1

## 2020-07-23 MED ORDER — HYDROMORPHONE HCL 1 MG/ML IJ SOLN: 0.5000 mg | INTRAMUSCULAR | Status: DC | PRN

## 2020-07-23 MED ORDER — CELECOXIB 100 MG PO CAPS
200.0000 mg | ORAL_CAPSULE | Freq: Every day | ORAL | Status: DC
  Administered 2020-07-23: 200 mg via ORAL
  Filled 2020-07-23 (×2): qty 2

## 2020-07-23 MED ORDER — OXYCODONE HCL 5 MG PO TABS
5.0000 mg | ORAL_TABLET | ORAL | Status: DC
  Administered 2020-07-23: 5 mg via ORAL
  Filled 2020-07-23: qty 1

## 2020-07-23 MED ORDER — ORAL CARE MOUTH RINSE: 15.0000 mL | Freq: Once | OROMUCOSAL | Status: AC

## 2020-07-23 MED ORDER — SUGAMMADEX SODIUM 500 MG/5ML IV SOLN
INTRAVENOUS | Status: DC | PRN
  Administered 2020-07-23: 400 mg via INTRAVENOUS

## 2020-07-23 MED ORDER — PREGABALIN 50 MG PO CAPS
ORAL_CAPSULE | ORAL | Status: AC
  Filled 2020-07-23: qty 1

## 2020-07-23 MED ORDER — DEXMEDETOMIDINE HCL IN NACL 200 MCG/50ML IV SOLN
INTRAVENOUS | Status: AC
  Filled 2020-07-23: qty 50

## 2020-07-23 MED ORDER — BUPIVACAINE-EPINEPHRINE (PF) 0.5% -1:200000 IJ SOLN
INTRAMUSCULAR | Status: DC | PRN
  Administered 2020-07-23 (×2): 15 mL via PERINEURAL

## 2020-07-23 MED ORDER — CEFAZOLIN SODIUM-DEXTROSE 2-4 GM/100ML-% IV SOLN
2.0000 g | INTRAVENOUS | Status: AC
  Administered 2020-07-23: 2 g via INTRAVENOUS
  Filled 2020-07-23: qty 100

## 2020-07-23 MED ORDER — BUPIVACAINE LIPOSOME 1.3 % IJ SUSP
INTRAMUSCULAR | Status: DC | PRN
  Administered 2020-07-23: 20 mL

## 2020-07-23 MED ORDER — FENTANYL CITRATE (PF) 100 MCG/2ML IJ SOLN
INTRAMUSCULAR | Status: DC | PRN
  Administered 2020-07-23: 100 ug via INTRAVENOUS

## 2020-07-23 MED ORDER — HYDROMORPHONE HCL 1 MG/ML IJ SOLN
INTRAMUSCULAR | Status: AC
  Filled 2020-07-23: qty 1

## 2020-07-23 MED ORDER — METHOCARBAMOL 1000 MG/10ML IJ SOLN
500.0000 mg | Freq: Four times a day (QID) | INTRAVENOUS | Status: DC
  Administered 2020-07-23: 500 mg via INTRAVENOUS
  Filled 2020-07-23 (×5): qty 5

## 2020-07-23 MED ORDER — PROMETHAZINE HCL 25 MG/ML IJ SOLN: 6.2500 mg | INTRAMUSCULAR | Status: DC | PRN

## 2020-07-23 MED ORDER — HYDROMORPHONE HCL 1 MG/ML IJ SOLN
INTRAMUSCULAR | Status: DC | PRN
  Administered 2020-07-23: .5 mg via INTRAVENOUS

## 2020-07-23 MED ORDER — BUPIVACAINE LIPOSOME 1.3 % IJ SUSP
INTRAMUSCULAR | Status: AC
  Filled 2020-07-23: qty 20

## 2020-07-23 MED ORDER — ONDANSETRON HCL 4 MG/2ML IJ SOLN
INTRAMUSCULAR | Status: AC
  Filled 2020-07-23: qty 2

## 2020-07-23 MED ORDER — LIDOCAINE 2% (20 MG/ML) 5 ML SYRINGE
INTRAMUSCULAR | Status: AC
  Filled 2020-07-23: qty 5

## 2020-07-23 MED ORDER — PREGABALIN 50 MG PO CAPS
50.0000 mg | ORAL_CAPSULE | Freq: Three times a day (TID) | ORAL | Status: DC
  Administered 2020-07-23: 50 mg via ORAL

## 2020-07-23 MED ORDER — BUPIVACAINE LIPOSOME 1.3 % IJ SUSP
INTRAMUSCULAR | Status: AC
  Filled 2020-07-23: qty 10

## 2020-07-23 MED ORDER — ROCURONIUM BROMIDE 100 MG/10ML IV SOLN
INTRAVENOUS | Status: DC | PRN
  Administered 2020-07-23: 50 mg via INTRAVENOUS
  Administered 2020-07-23: 20 mg via INTRAVENOUS

## 2020-07-23 SURGICAL SUPPLY — 52 items
ANCH SUT SWLK 19.1X4.75 (Anchor) ×1 IMPLANT
ANCHOR SUT BIO SW 4.75X19.1 (Anchor) ×2 IMPLANT
APL SKNCLS STERI-STRIP NONHPOA (GAUZE/BANDAGES/DRESSINGS) ×1
BENZOIN TINCTURE PRP APPL 2/3 (GAUZE/BANDAGES/DRESSINGS) ×3 IMPLANT
BLADE HEX COATED 2.75 (ELECTRODE) ×3 IMPLANT
BUR FAST CUTTING (BURR) ×3
BUR SRG 54X4.7X12 FLUT (BURR) IMPLANT
BURR SRG 54X4.7X12 FLUT (BURR) ×1
CLOSURE STERI-STRIP 1/2X4 (GAUZE/BANDAGES/DRESSINGS) ×1
CLOTH BEACON ORANGE TIMEOUT ST (SAFETY) ×3 IMPLANT
CLSR STERI-STRIP ANTIMIC 1/2X4 (GAUZE/BANDAGES/DRESSINGS) ×2 IMPLANT
COVER LIGHT HANDLE STERIS (MISCELLANEOUS) ×6 IMPLANT
COVER WAND RF STERILE (DRAPES) ×3 IMPLANT
DECANTER SPIKE VIAL GLASS SM (MISCELLANEOUS) ×2 IMPLANT
DRAPE ORTHO 2.5IN SPLIT 77X108 (DRAPES) ×2 IMPLANT
DRAPE ORTHO SPLIT 77X108 STRL (DRAPES) ×6
DRESSING ALLEVYN BORDER 5X5 (GAUZE/BANDAGES/DRESSINGS) ×2 IMPLANT
DURAPREP 26ML APPLICATOR (WOUND CARE) ×2 IMPLANT
ELECT REM PT RETURN 9FT ADLT (ELECTROSURGICAL) ×3
ELECTRODE REM PT RTRN 9FT ADLT (ELECTROSURGICAL) ×1 IMPLANT
GLOVE BIO SURGEON STRL SZ7 (GLOVE) ×4 IMPLANT
GLOVE BIOGEL PI IND STRL 7.0 (GLOVE) ×3 IMPLANT
GLOVE BIOGEL PI INDICATOR 7.0 (GLOVE) ×6
GLOVE SKINSENSE NS SZ8.0 LF (GLOVE) ×2
GLOVE SKINSENSE STRL SZ8.0 LF (GLOVE) ×1 IMPLANT
GLOVE SS N UNI LF 8.5 STRL (GLOVE) ×3 IMPLANT
GOWN STRL REUS W/TWL LRG LVL3 (GOWN DISPOSABLE) ×6 IMPLANT
GOWN STRL REUS W/TWL XL LVL3 (GOWN DISPOSABLE) ×3 IMPLANT
INST SET MINOR BONE (KITS) ×3 IMPLANT
KIT BLADEGUARD II DBL (SET/KITS/TRAYS/PACK) ×3 IMPLANT
KIT TURNOVER KIT A (KITS) ×3 IMPLANT
MANIFOLD NEPTUNE II (INSTRUMENTS) ×3 IMPLANT
MARKER SKIN DUAL TIP RULER LAB (MISCELLANEOUS) ×3 IMPLANT
NDL HYPO 18GX1.5 BLUNT FILL (NEEDLE) IMPLANT
NDL HYPO 21X1.5 SAFETY (NEEDLE) ×1 IMPLANT
NDL MAYO 6 CRC TAPER PT (NEEDLE) IMPLANT
NEEDLE HYPO 18GX1.5 BLUNT FILL (NEEDLE) ×3 IMPLANT
NEEDLE HYPO 21X1.5 SAFETY (NEEDLE) ×3 IMPLANT
NEEDLE MAYO 6 CRC TAPER PT (NEEDLE) ×3 IMPLANT
NS IRRIG 1000ML POUR BTL (IV SOLUTION) ×3 IMPLANT
PACK TOTAL JOINT (CUSTOM PROCEDURE TRAY) ×3 IMPLANT
PAD ARMBOARD 7.5X6 YLW CONV (MISCELLANEOUS) ×3 IMPLANT
SET BASIN LINEN APH (SET/KITS/TRAYS/PACK) ×3 IMPLANT
SLING ARM IMMOBILIZER LRG (SOFTGOODS) ×2 IMPLANT
STAPLER VISISTAT 35W (STAPLE) ×2 IMPLANT
SUT ETHIBOND NAB OS 4 #2 30IN (SUTURE) ×3 IMPLANT
SUT MON AB 0 CT1 (SUTURE) ×3 IMPLANT
SUT MON AB 2-0 CT1 36 (SUTURE) ×3 IMPLANT
SYR 20ML LL LF (SYRINGE) ×4 IMPLANT
SYR 30ML LL (SYRINGE) ×2 IMPLANT
SYR BULB IRRIG 60ML STRL (SYRINGE) ×3 IMPLANT
YANKAUER SUCT 12FT TUBE ARGYLE (SUCTIONS) ×2 IMPLANT

## 2020-07-23 NOTE — Brief Op Note (Signed)
07/23/2020  9:16 AM  PATIENT:  Mario Meyer  59 y.o. male  PRE-OPERATIVE DIAGNOSIS:  Right shoulder rotator cuff tear / biceps tendonitis  POST-OPERATIVE DIAGNOSIS:  Right shoulder biceps tendonitis  PROCEDURE:  Procedure(s): RIGHT SHOULDER OPEN BICEPS TENODESIS (Right)   Operative findings The rotator cuff repair was intact The patient had internal rotation contracture with 0 degrees external rotation with his arm at the side preop under anesthesia The biceps tendon was severely diseased with disease in the bicipital groove as well as the joint it was severely attenuated  Implant swivel lock anchor with luggage suture construct Arthrex  Surgery was done as follows  Patient was seen in the preop area appropriately worked up and found to be amenable to surgery  He was taken to the operating for general anesthesia placed in the modified beachchair position with 2 folded towels under his right scapula after brief examination under anesthesia revealed that he had no external rotation past 0 degrees with his arm at his side the arm was then prepped and draped sterilely  Note full flexion and full abduction under anesthesia  After sterile prep and drape and timeout the previous skin incision was injected with Marcaine with epinephrine and then it was opened up down to the deltoid fascia which was split in line with the skin incision up to the acromion and partially removed  The rotator cuff was found noted to be intact.  I then extended the incision and opened up the bicipital groove and found an attenuated tendon.  I followed it through the rotator interval into the joint and removed it from the glenoid labrum  After irrigation and further debridement of the labrum we placed a luggage suture type construct around the tendon debrided the remaining diseased tendon past the suture from the luggage tag into the tendon and then punched the proximal femur just distal to the bicipital groove.   This area was chosen as the tendon remaining that was not disease would not go above the bicipital groove without putting undue tension on the arm.  The suture was placed with the suture anchor to anchor the biceps to the proximal humerus with the arm in full extension at the elbow.  Excess tendon was removed the wound was irrigated and closed with #2 Ethibond suture for the deltoid split and 0 Monocryl for the subcu tissue.  The soft tissues were injected with Marcaine and epinephrine and 20 cc of exparel  Skin was closed with staples and sterile dressing was applied  Patient was placed in a sling and extubated taken to recovery room in stable condition  SURGEON:  Surgeon(s) and Role:    Carole Civil, MD - Primary  PHYSICIAN ASSISTANT:   ASSISTANTS: cynthia wrenn   ANESTHESIA:   general  EBL:  100 mL   BLOOD ADMINISTERED:none  DRAINS: none   LOCAL MEDICATIONS USED:  MARCAINE    and OTHER exparel  SPECIMEN:  No Specimen  DISPOSITION OF SPECIMEN:  N/A  COUNTS:  YES  TOURNIQUET:  * No tourniquets in log *  DICTATION: .Dragon Dictation  PLAN OF CARE: Discharge to home after PACU  PATIENT DISPOSITION:  PACU - hemodynamically stable.   Delay start of Pharmacological VTE agent (>24hrs) due to surgical blood loss or risk of bleeding: not applicable

## 2020-07-23 NOTE — Anesthesia Procedure Notes (Signed)
Procedure Name: Intubation Date/Time: 07/23/2020 7:35 AM Performed by: Jonna Munro, CRNA Pre-anesthesia Checklist: Patient identified, Emergency Drugs available, Suction available, Patient being monitored and Timeout performed Patient Re-evaluated:Patient Re-evaluated prior to induction Oxygen Delivery Method: Circle system utilized Preoxygenation: Pre-oxygenation with 100% oxygen Induction Type: IV induction Ventilation: Mask ventilation without difficulty Laryngoscope Size: Mac and 3 Grade View: Grade I Tube type: Oral Tube size: 7.5 mm Number of attempts: 1 Placement Confirmation: positive ETCO2,  ETT inserted through vocal cords under direct vision and breath sounds checked- equal and bilateral Secured at: 22 cm Tube secured with: Tape Dental Injury: Teeth and Oropharynx as per pre-operative assessment

## 2020-07-23 NOTE — Anesthesia Preprocedure Evaluation (Signed)
Anesthesia Evaluation  Patient identified by MRN, date of birth, ID band Patient awake    Reviewed: Allergy & Precautions, NPO status , Patient's Chart, lab work & pertinent test results  History of Anesthesia Complications Negative for: history of anesthetic complications  Airway Mallampati: II  TM Distance: >3 FB Neck ROM: Full    Dental  (+) Teeth Intact, Caps Crowns :   Pulmonary  Room air oxygen saturation- 95%  Room air oxygen saturation- 95% Pulmonary exam normal breath sounds clear to auscultation       Cardiovascular Exercise Tolerance: Good + angina + DOE  Normal cardiovascular examI Rhythm:Regular Rate:Normal  States active -cathed in 02/2019 -normal - Reports good ET Denies CP/DOE since March  Never uses Nitrates    Neuro/Psych Anxiety negative neurological ROS  negative psych ROS   GI/Hepatic negative GI ROS, Neg liver ROS,   Endo/Other  negative endocrine ROS  Renal/GU negative Renal ROS  negative genitourinary   Musculoskeletal  (+) Arthritis , Osteoarthritis,    Abdominal   Peds negative pediatric ROS (+)  Hematology negative hematology ROS (+)   Anesthesia Other Findings   Reproductive/Obstetrics negative OB ROS                             Anesthesia Physical  Anesthesia Plan  ASA: II  Anesthesia Plan: General   Post-op Pain Management:    Induction: Intravenous  PONV Risk Score and Plan: 3 and Ondansetron, Dexamethasone and Midazolam  Airway Management Planned: Oral ETT  Additional Equipment:   Intra-op Plan:   Post-operative Plan:   Informed Consent: I have reviewed the patients History and Physical, chart, labs and discussed the procedure including the risks, benefits and alternatives for the proposed anesthesia with the patient or authorized representative who has indicated his/her understanding and acceptance.     Dental advisory given  Plan  Discussed with: CRNA and Surgeon  Anesthesia Plan Comments: ( )       Anesthesia Quick Evaluation

## 2020-07-23 NOTE — Interval H&P Note (Signed)
History and Physical Interval Note:  07/23/2020 7:22 AM  Mario Meyer  has presented today for surgery, with the diagnosis of Right shoulder rotator cuff tear / biceps tendonitis.  The various methods of treatment have been discussed with the patient and family. After consideration of risks, benefits and other options for treatment, the patient has consented to  Procedure(s): ROTATOR CUFF REPAIR SHOULDER OPEN AND BICEPS TENODESIS (Right) as a surgical intervention.  The patient's history has been reviewed, patient examined, no change in status, stable for surgery.  I have reviewed the patient's chart and labs.  Questions were answered to the patient's satisfaction.     Arther Abbott

## 2020-07-23 NOTE — Anesthesia Postprocedure Evaluation (Signed)
Anesthesia Post Note  Patient: Mario Meyer  Procedure(s) Performed: RIGHT SHOULDER OPEN BICEPS TENODESIS (Right Shoulder)  Patient location during evaluation: PACU Anesthesia Type: General Level of consciousness: awake, oriented, awake and alert and patient cooperative Pain management: pain level controlled Vital Signs Assessment: post-procedure vital signs reviewed and stable Respiratory status: spontaneous breathing, respiratory function stable, nonlabored ventilation and patient connected to face mask oxygen Cardiovascular status: stable Postop Assessment: no apparent nausea or vomiting Anesthetic complications: no   No complications documented.   Last Vitals:  Vitals:   07/23/20 0640 07/23/20 0724  BP: 123/82 122/80  Pulse: 68   Resp: 14   Temp: 36.5 C   SpO2: 96%     Last Pain:  Vitals:   07/23/20 0640  TempSrc: Oral  PainSc: 0-No pain                 Elane Peabody

## 2020-07-23 NOTE — Op Note (Signed)
07/23/2020  9:16 AM  PATIENT:  Mario Meyer  59 y.o. male  PRE-OPERATIVE DIAGNOSIS:  Right shoulder rotator cuff tear / biceps tendonitis  POST-OPERATIVE DIAGNOSIS:  Right shoulder biceps tendonitis  PROCEDURE:  Procedure(s): RIGHT SHOULDER OPEN BICEPS TENODESIS (Right)   Operative findings The rotator cuff repair was intact The patient had internal rotation contracture with 0 degrees external rotation with his arm at the side preop under anesthesia The biceps tendon was severely diseased with disease in the bicipital groove as well as the joint it was severely attenuated  Implant swivel lock anchor with luggage suture construct Arthrex  Surgery was done as follows  Patient was seen in the preop area appropriately worked up and found to be amenable to surgery  He was taken to the operating for general anesthesia placed in the modified beachchair position with 2 folded towels under his right scapula after brief examination under anesthesia revealed that he had no external rotation past 0 degrees with his arm at his side the arm was then prepped and draped sterilely  Note full flexion and full abduction under anesthesia  After sterile prep and drape and timeout the previous skin incision was injected with Marcaine with epinephrine and then it was opened up down to the deltoid fascia which was split in line with the skin incision up to the acromion and partially removed  The rotator cuff was found noted to be intact.  I then extended the incision and opened up the bicipital groove and found an attenuated tendon.  I followed it through the rotator interval into the joint and removed it from the glenoid labrum  After irrigation and further debridement of the labrum we placed a luggage suture type construct around the tendon debrided the remaining diseased tendon past the suture from the luggage tag into the tendon and then punched the proximal femur just distal to the bicipital groove.   This area was chosen as the tendon remaining that was not disease would not go above the bicipital groove without putting undue tension on the arm.  The suture was placed with the suture anchor to anchor the biceps to the proximal humerus with the arm in full extension at the elbow.  Excess tendon was removed the wound was irrigated and closed with #2 Ethibond suture for the deltoid split and 0 Monocryl for the subcu tissue.  The soft tissues were injected with Marcaine and epinephrine and 20 cc of exparel  Skin was closed with staples and sterile dressing was applied  Patient was placed in a sling and extubated taken to recovery room in stable condition  SURGEON:  Surgeon(s) and Role:    Carole Civil, MD - Primary  PHYSICIAN ASSISTANT:   ASSISTANTS: cynthia wrenn   ANESTHESIA:   general  EBL:  100 mL   BLOOD ADMINISTERED:none  DRAINS: none   LOCAL MEDICATIONS USED:  MARCAINE    and OTHER exparel  SPECIMEN:  No Specimen  DISPOSITION OF SPECIMEN:  N/A  COUNTS:  YES  TOURNIQUET:  * No tourniquets in log *  DICTATION: .Dragon Dictation  PLAN OF CARE: Discharge to home after PACU  PATIENT DISPOSITION:  PACU - hemodynamically stable.   Delay start of Pharmacological VTE agent (>24hrs) due to surgical blood loss or risk of bleeding: not applicable

## 2020-07-23 NOTE — Discharge Instructions (Signed)

## 2020-07-23 NOTE — Transfer of Care (Signed)
Immediate Anesthesia Transfer of Care Note  Patient: LEO FRAY  Procedure(s) Performed: RIGHT SHOULDER OPEN BICEPS TENODESIS (Right Shoulder)  Patient Location: PACU  Anesthesia Type:General  Level of Consciousness: awake, alert , oriented and patient cooperative  Airway & Oxygen Therapy: Patient Spontanous Breathing and Patient connected to face mask oxygen  Post-op Assessment: Report given to RN, Post -op Vital signs reviewed and stable and Patient moving all extremities X 4  Post vital signs: Reviewed and stable  Last Vitals:  Vitals Value Taken Time  BP 155/83 07/23/20 0918  Temp    Pulse 90 07/23/20 0921  Resp 14 07/23/20 0921  SpO2 94 % 07/23/20 0921  Vitals shown include unvalidated device data.  Last Pain:  Vitals:   07/23/20 0640  TempSrc: Oral  PainSc: 0-No pain         Complications: No complications documented.

## 2020-07-25 ENCOUNTER — Other Ambulatory Visit: Payer: Self-pay

## 2020-07-25 ENCOUNTER — Encounter: Payer: Self-pay | Admitting: Orthopedic Surgery

## 2020-07-25 ENCOUNTER — Ambulatory Visit (INDEPENDENT_AMBULATORY_CARE_PROVIDER_SITE_OTHER): Payer: 59 | Admitting: Orthopedic Surgery

## 2020-07-25 VITALS — BP 120/85 | HR 89 | Ht 73.0 in

## 2020-07-25 DIAGNOSIS — Z9889 Other specified postprocedural states: Secondary | ICD-10-CM

## 2020-07-25 NOTE — Patient Instructions (Signed)
Start pendulums and cane exercises   Sling only as needed

## 2020-07-25 NOTE — Progress Notes (Signed)
Chief Complaint  Patient presents with   Shoulder Pain    surgery tues 8/17    Status post open exploration right shoulder rotator cuff tear was completely healed his biceps tendon and partially torn he had a biceps tenodesis  He came in today so I can let him know that he can start immediate pendulums and cane assisted exercises sling as needed follow-up for staple removal.  Encounter Diagnosis  Name Primary?   S/P arthroscopy of right shoulder  biceps tenodesis 08/31/19/ open biceps tenodesis 07/23/20 Yes

## 2020-07-26 ENCOUNTER — Encounter (HOSPITAL_COMMUNITY): Payer: Self-pay | Admitting: Orthopedic Surgery

## 2020-08-05 ENCOUNTER — Encounter: Payer: Self-pay | Admitting: Orthopedic Surgery

## 2020-08-05 ENCOUNTER — Other Ambulatory Visit: Payer: Self-pay

## 2020-08-05 ENCOUNTER — Ambulatory Visit (INDEPENDENT_AMBULATORY_CARE_PROVIDER_SITE_OTHER): Payer: 59 | Admitting: Orthopedic Surgery

## 2020-08-05 DIAGNOSIS — Z9889 Other specified postprocedural states: Secondary | ICD-10-CM

## 2020-08-05 MED ORDER — OXYCODONE-ACETAMINOPHEN 5-325 MG PO TABS: 1.0000 | ORAL_TABLET | ORAL | 0 refills | Status: AC | PRN

## 2020-08-05 NOTE — Addendum Note (Signed)
Addended byCandice Camp on: 08/05/2020 09:43 AM   Modules accepted: Orders

## 2020-08-05 NOTE — Progress Notes (Signed)
Chief Complaint  Patient presents with  . Routine Post Op    07/23/20 right shoulder pain    Encounter Diagnoses  Name Primary?  . S/P shoulder surgery right biceps tenodesis 07-23-20 Yes  . S/P arthroscopy of right shoulder  biceps debrided 08/31/19/    . Status post right rotator cuff repair// MUA 04/16/20    Meds ordered this encounter  Medications  . oxyCODONE-acetaminophen (PERCOCET/ROXICET) 5-325 MG tablet    Sig: Take 1 tablet by mouth every 4 (four) hours as needed for up to 7 days for severe pain.    Dispense:  28 tablet    Refill:  0   Today we removed his staples his wound looks fine  He started pendulums last visit and can go to therapy now follow-up in 2 weeks

## 2020-08-15 ENCOUNTER — Telehealth: Payer: Self-pay | Admitting: Orthopedic Surgery

## 2020-08-15 NOTE — Telephone Encounter (Signed)
Notes re-faxed to Is[facility] Holts - states that our fax sent on 08/08/20 did not reach. Faxed notes of 08/05/20 office visit as requested/authorization on file, to alternate fax [phone].

## 2020-08-18 ENCOUNTER — Other Ambulatory Visit: Payer: Self-pay | Admitting: Orthopedic Surgery

## 2020-08-19 DIAGNOSIS — Z9889 Other specified postprocedural states: Secondary | ICD-10-CM | POA: Insufficient documentation

## 2020-08-21 ENCOUNTER — Telehealth: Payer: Self-pay | Admitting: *Deleted

## 2020-08-21 ENCOUNTER — Encounter: Payer: Self-pay | Admitting: Orthopedic Surgery

## 2020-08-21 ENCOUNTER — Other Ambulatory Visit: Payer: Self-pay

## 2020-08-21 ENCOUNTER — Ambulatory Visit (INDEPENDENT_AMBULATORY_CARE_PROVIDER_SITE_OTHER): Payer: 59 | Admitting: Orthopedic Surgery

## 2020-08-21 DIAGNOSIS — Z9889 Other specified postprocedural states: Secondary | ICD-10-CM

## 2020-08-21 MED ORDER — OXYCODONE-ACETAMINOPHEN 5-325 MG PO TABS: 1.0000 | ORAL_TABLET | ORAL | 0 refills | Status: DC | PRN

## 2020-08-21 NOTE — Progress Notes (Signed)
Chief Complaint  Patient presents with  . Routine Post Op    07/23/20 right shoulder still decrease ROM    07/23/2020  9:16 AM  PATIENT:  Mario Meyer  59 y.o. male  PRE-OPERATIVE DIAGNOSIS:  Right shoulder rotator cuff tear / biceps tendonitis  POST-OPERATIVE DIAGNOSIS:  Right shoulder biceps tendonitis  PROCEDURE:  Procedure(s): RIGHT SHOULDER OPEN BICEPS TENODESIS (Right)   Operative findings The rotator cuff repair was intact The patient had internal rotation contracture with 0 degrees external rotation with his arm at the side preop under anesthesia The biceps tendon was severely diseased with disease in the bicipital groove as well as the joint it was severely attenuated  Implant swivel lock anchor with luggage suture construct Arthrex  POW # 4   ROM TODAY: Passively elevated his arm to 150 degrees, he did not like it but he did he is finding this like crazy I presume because it hurts encouraged him to try to go with the motion instead of against  PAIN MEDS: IBUPROFEN. OXYCODONE  PT   4 WK FU

## 2020-08-21 NOTE — Patient Instructions (Signed)
Continue pt

## 2020-08-21 NOTE — Telephone Encounter (Signed)
Patient calls to report chest pain for 3 or so days- pain comes and goes and is a dull constant kind of pain. It seems to be on both sides- no arm pain- no cough, SOB or respiratory sx- pain is not affected by movement. Patient was advised to the ER for evaluation and treatment, Patient agreed with recommendations.

## 2020-08-22 NOTE — Telephone Encounter (Signed)
Patient states he did see the doctor at the ER and he told him to follow up with PCP to get treatment for anxiety. Patient scheduled office tomorrow with Dr Nicki Reaper.

## 2020-08-22 NOTE — Telephone Encounter (Signed)
See message from yesterday below : Patient with dull chest pain that had been going on for several days- no sickness sx no SOB, pain in arm, etc just dull chestpain

## 2020-08-22 NOTE — Telephone Encounter (Signed)
Patient called yesterday for chest pains and was told to go to the ER. He went to [facility] and was there for 5 hours. He said that his EKG looked good and his labwork was all good and they said it was anxiety. He said he "borrowed" a nerve pill from someone and he didn't have anymore pain last night but now it's starting back again this morning.  Not sure how to schedule this patient? (He said he has therapy until 10 this morning so to call back after 10)

## 2020-08-22 NOTE — Telephone Encounter (Signed)
Left message to return call 

## 2020-08-22 NOTE — Telephone Encounter (Signed)
So did he leave without seeing the doctor?  Unfortunately I am unable to access this information at this time I am unable to state at all if this is due to anxiety or other issues If he is having severe pain that he is concerned about a heart attack ER is the proper approach  If the patient would like to be seen we could work him into office visit today at 46 or into to the schedule tomorrow

## 2020-08-23 ENCOUNTER — Other Ambulatory Visit: Payer: Self-pay

## 2020-08-23 ENCOUNTER — Encounter: Payer: Self-pay | Admitting: Family Medicine

## 2020-08-23 ENCOUNTER — Ambulatory Visit (INDEPENDENT_AMBULATORY_CARE_PROVIDER_SITE_OTHER): Payer: 59 | Admitting: Family Medicine

## 2020-08-23 VITALS — BP 124/84 | HR 88 | Temp 97.9°F | Wt 218.4 lb

## 2020-08-23 DIAGNOSIS — F439 Reaction to severe stress, unspecified: Secondary | ICD-10-CM | POA: Diagnosis not present

## 2020-08-23 DIAGNOSIS — F411 Generalized anxiety disorder: Secondary | ICD-10-CM

## 2020-08-23 DIAGNOSIS — R079 Chest pain, unspecified: Secondary | ICD-10-CM | POA: Diagnosis not present

## 2020-08-23 MED ORDER — ALPRAZOLAM 0.5 MG PO TABS
ORAL_TABLET | ORAL | 0 refills | Status: DC
Start: 2020-08-23 — End: 2021-07-22

## 2020-08-23 MED ORDER — SERTRALINE HCL 50 MG PO TABS
50.0000 mg | ORAL_TABLET | Freq: Every day | ORAL | 3 refills | Status: DC
Start: 2020-08-23 — End: 2020-09-16

## 2020-08-23 NOTE — Progress Notes (Signed)
   Subjective:    Patient ID: Mario Meyer, male    DOB: August 05, 1961, 59 y.o.   MRN: [id number]  HPI Pt has been having chest pain since Sunday. Pain comes and goes and is mostly above both breasts. Pt states the back of his head has been killing him. Took Extra Strength Tylenol. Feeling tired, sluggish and worn down. 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 Pt states something doesn't feel right. Pt went to [facility] on 08/21/20. EKG and blood work done.  Dull pain last Sunday Comes and goes 7 days a week taking care of his Dad Stressed about eviction Feels stressed and nervous Not sleeping well Denies depressed Patient states when he does activity out in the yard or at work he does not have any chest tightness pressure pain When he gets thinking about things are when he gets aggravated such as when his son gets in an argument with him he feels the discomfort in his chest.  Also he states when he thinks about some of the stressors he is under he feels the discomfort. The patient did go to the ER they did a x-ray and lab work came back negative He had a cardiac catheterization last year when his coronary arteries look very good Review of Systems See above    Objective:   Physical Exam Neck no masses lungs clear no crackles heart is regular pulse normal extremities no edema blood pressure good EKG does not show any acute changes no ST segment changes.  Lab work EKG from ER was reviewed  GAD-7 as well as PHQ-9 was reviewed GAD-7 positive in regards to high stress levels patient not suicidal PHQ-9 negative for depression    Assessment & Plan:  Chest pains I believe these are stress related Patient would benefit from medication I do not feel he needs to go back to cardiology currently unless these chest discomforts continue He will do a chest x-ray to make sure there is not underlying issues that we are not hearing I do not feel he has a blood clot I  did encourage the patient to consider medication After consideration he consents We will start sertraline daily Xanax only when necessary Patient to follow-up in several weeks for recheck Patient is to notify us if things are getting worse or any other problems

## 2020-08-23 NOTE — Patient Instructions (Signed)
Stress, Adult Stress is a normal reaction to life events. Stress is what you feel when life demands more than you are used to, or more than you think you can handle. Some stress can be useful, such as studying for a test or meeting a deadline at work. Stress that occurs too often or for too long can cause problems. It can affect your emotional health and interfere with relationships and normal daily activities. Too much stress can weaken your body's defense system (immune system) and increase your risk for physical illness. If you already have a medical problem, stress can make it worse. What are the causes? All sorts of life events can cause stress. An event that causes stress for one person may not be stressful for another person. Major life events, whether positive or negative, commonly cause stress. Examples include:  Losing a job or starting a new job.  Losing a loved one.  Moving to a new town or home.  Getting married or divorced.  Having a baby.  Getting injured or sick. Less obvious life events can also cause stress, especially if they occur day after day or in combination with each other. Examples include:  Working long hours.  Driving in traffic.  Caring for children.  Being in debt.  Being in a difficult relationship. What are the signs or symptoms? Stress can cause emotional symptoms, including:  Anxiety. This is feeling worried, afraid, on edge, overwhelmed, or out of control.  Anger, including irritation or impatience.  Depression. This is feeling sad, down, helpless, or guilty.  Trouble focusing, remembering, or making decisions. Stress can cause physical symptoms, including:  Aches and pains. These may affect your head, neck, back, stomach, or other areas of your body.  Tight muscles or a clenched jaw.  Low energy.  Trouble sleeping. Stress can cause unhealthy behaviors, including:  Eating to feel better (overeating) or skipping meals.  Working too  much or putting off tasks.  Smoking, drinking alcohol, or using drugs to feel better. How is this diagnosed? Stress is diagnosed through an assessment by your health care provider. He or she may diagnose this condition based on:  Your symptoms and any stressful life events.  Your medical history.  Tests to rule out other causes of your symptoms. Depending on your condition, your health care provider may refer you to a specialist for further evaluation. How is this treated?  Stress management techniques are the recommended treatment for stress. Medicine is not typically recommended for the treatment of stress. Techniques to reduce your reaction to stressful life events include:  Stress identification. Monitor yourself for symptoms of stress and identify what causes stress for you. These skills may help you to avoid or prepare for stressful events.  Time management. Set your priorities, keep a calendar of events, and learn to say no. Taking these actions can help you avoid making too many commitments. Techniques for coping with stress include:  Rethinking the problem. Try to think realistically about stressful events rather than ignoring them or overreacting. Try to find the positives in a stressful situation rather than focusing on the negatives.  Exercise. Physical exercise can release both physical and emotional tension. The key is to find a form of exercise that you enjoy and do it regularly.  Relaxation techniques. These relax the body and mind. The key is to find one or more that you enjoy and use the techniques regularly. Examples include: ? Meditation, deep breathing, or progressive relaxation techniques. ? Yoga or   tai chi. ? Biofeedback, mindfulness techniques, or journaling. ? Listening to music, being out in nature, or participating in other hobbies.  Practicing a healthy lifestyle. Eat a balanced diet, drink plenty of water, limit or avoid caffeine, and get plenty of  sleep.  Having a strong support network. Spend time with family, friends, or other people you enjoy being around. Express your feelings and talk things over with someone you trust. Counseling or talk therapy with a mental health professional may be helpful if you are having trouble managing stress on your own. Follow these instructions at home: Lifestyle   Avoid drugs.  Do not use any products that contain nicotine or tobacco, such as cigarettes, e-cigarettes, and chewing tobacco. If you need help quitting, ask your health care provider.  Limit alcohol intake to no more than 1 drink a day for nonpregnant women and 2 drinks a day for men. One drink equals 12 oz of beer, 5 oz of wine, or 1 oz of hard liquor  Do not use alcohol or drugs to relax.  Eat a balanced diet that includes fresh fruits and vegetables, whole grains, lean meats, fish, eggs, and beans, and low-fat dairy. Avoid processed foods and foods high in added fat, sugar, and salt.  Exercise at least 30 minutes on 5 or more days each week.  Get 7-8 hours of sleep each night. General instructions   Practice stress management techniques as discussed with your health care provider.  Drink enough fluid to keep your urine clear or pale yellow.  Take over-the-counter and prescription medicines only as told by your health care provider.  Keep all follow-up visits as told by your health care provider. This is important. Contact a health care provider if:  Your symptoms get worse.  You have new symptoms.  You feel overwhelmed by your problems and can no longer manage them on your own. Get help right away if:  You have thoughts of hurting yourself or others. If you ever feel like you may hurt yourself or others, or have thoughts about taking your own life, get help right away. You can go to your nearest emergency department or call:  Your local emergency services (911 in the [place]  A suicide crisis helpline, such as the  [place] at (316) 250-6172. This is open 24 hours a day. Summary  Stress is a normal reaction to life events. It can cause problems if it happens too often or for too long.  Practicing stress management techniques is the best way to treat stress.  Counseling or talk therapy with a mental health professional may be helpful if you are having trouble managing stress on your own. This information is not intended to replace advice given to you by your health care provider. Make sure you discuss any questions you have with your health care provider. Document Revised: 06/23/2019 Document Reviewed: 01/13/2017 Elsevier Patient Education  [place].

## 2020-09-14 ENCOUNTER — Other Ambulatory Visit: Payer: Self-pay | Admitting: Family Medicine

## 2020-09-18 ENCOUNTER — Ambulatory Visit (INDEPENDENT_AMBULATORY_CARE_PROVIDER_SITE_OTHER): Payer: 59 | Admitting: Orthopedic Surgery

## 2020-09-18 ENCOUNTER — Other Ambulatory Visit: Payer: Self-pay | Admitting: Orthopedic Surgery

## 2020-09-18 ENCOUNTER — Encounter: Payer: Self-pay | Admitting: Orthopedic Surgery

## 2020-09-18 ENCOUNTER — Other Ambulatory Visit: Payer: Self-pay

## 2020-09-18 DIAGNOSIS — Z9889 Other specified postprocedural states: Secondary | ICD-10-CM

## 2020-09-18 NOTE — Patient Instructions (Signed)
Continue pt

## 2020-09-18 NOTE — Progress Notes (Signed)
Chief Complaint  Patient presents with  . Routine Post Op    8.17.21 surgery rt shoulder    8 weeks post op biceps tenodesis   C/o pain anteerior shoulder   ROM passive is 110 flexion 95 abd 40 ER arm at side   Continue PT   Return 1 month

## 2020-09-23 ENCOUNTER — Encounter: Payer: Self-pay | Admitting: Family Medicine

## 2020-09-23 ENCOUNTER — Ambulatory Visit (INDEPENDENT_AMBULATORY_CARE_PROVIDER_SITE_OTHER): Payer: 59 | Admitting: Family Medicine

## 2020-09-23 VITALS — BP 126/70 | HR 86 | Temp 97.0°F | Ht 73.0 in | Wt 214.0 lb

## 2020-09-23 DIAGNOSIS — Z125 Encounter for screening for malignant neoplasm of prostate: Secondary | ICD-10-CM | POA: Diagnosis not present

## 2020-09-23 DIAGNOSIS — E782 Mixed hyperlipidemia: Secondary | ICD-10-CM

## 2020-09-23 MED ORDER — SERTRALINE HCL 100 MG PO TABS: 100.0000 mg | ORAL_TABLET | Freq: Every day | ORAL | 1 refills | Status: DC

## 2020-09-23 NOTE — Progress Notes (Signed)
   Subjective:    Patient ID: Mario Meyer, male    DOB: 04-20-1961, 59 y.o.   MRN: [id number]  HPIfollow up on starting sertraline. Pt states med has helped some but thinks he needs a stronger dose.   Patient states when he does get stressed sometimes he does feel some tightness in his chest but when he is not stressed and doing regular physical activities such as yard work or physical labor he does not have any chest pain or shortness of breath He states his overall energy level is doing well Please see previous note please also see previous documentation from [facility].  Plus also patient had a catheterization in 2020 which showed normal coronary arteries  Review of Systems  Constitutional: Negative for activity change, fatigue and fever.  HENT: Negative for congestion and rhinorrhea.   Respiratory: Negative for cough and shortness of breath.   Cardiovascular: Negative for chest pain and leg swelling.  Gastrointestinal: Negative for abdominal pain, diarrhea and nausea.  Genitourinary: Negative for dysuria and hematuria.  Neurological: Negative for weakness and headaches.  Psychiatric/Behavioral: Negative for agitation and behavioral problems.       Objective:   Physical Exam Vitals reviewed.  Cardiovascular:     Rate and Rhythm: Normal rate and regular rhythm.     Heart sounds: Normal heart sounds. No murmur heard.   Pulmonary:     Effort: Pulmonary effort is normal.     Breath sounds: Normal breath sounds.  Lymphadenopathy:     Cervical: No cervical adenopathy.  Neurological:     Mental Status: He is alert.  Psychiatric:        Behavior: Behavior normal.           Assessment & Plan:  Stress related issues Overall doing well I do believe patient would do better if we bumped up the dose of the medication instead of 50 mg we will utilize 100 mg Will have the patient follow-up if progressive troubles or worse Otherwise recheck patient in 4 to 6 weeks Patient is due for  lipid and PSA

## 2020-09-24 LAB — LIPID PANEL
Chol/HDL Ratio: 3.3 ratio (ref 0.0–5.0)
Cholesterol, Total: 157 mg/dL (ref 100–199)
HDL: 48 mg/dL (ref >39)
LDL Chol Calc (NIH): 78 mg/dL (ref 0–99)
Triglycerides: 183 mg/dL — ABNORMAL HIGH (ref 0–149)
VLDL Cholesterol Cal: 31 mg/dL (ref 5–40)

## 2020-09-24 LAB — PSA: Prostate Specific Ag, Serum: 2.6 ng/mL (ref 0.0–4.0)

## 2020-09-25 ENCOUNTER — Other Ambulatory Visit: Payer: Self-pay | Admitting: *Deleted

## 2020-09-25 ENCOUNTER — Telehealth: Payer: Self-pay

## 2020-09-25 DIAGNOSIS — Z1211 Encounter for screening for malignant neoplasm of colon: Secondary | ICD-10-CM

## 2020-09-25 DIAGNOSIS — D128 Benign neoplasm of rectum: Secondary | ICD-10-CM

## 2020-09-25 NOTE — Telephone Encounter (Signed)
See lab results. Discussed with pt.

## 2020-09-25 NOTE — Telephone Encounter (Signed)
Patient is returning call to nurse.[phone]

## 2020-09-25 NOTE — Telephone Encounter (Signed)
Left message to return call (regarding lab work results)

## 2020-10-01 ENCOUNTER — Other Ambulatory Visit: Payer: Self-pay

## 2020-10-01 ENCOUNTER — Encounter: Payer: Self-pay | Admitting: Family Medicine

## 2020-10-01 ENCOUNTER — Ambulatory Visit (INDEPENDENT_AMBULATORY_CARE_PROVIDER_SITE_OTHER): Payer: 59 | Admitting: Family Medicine

## 2020-10-01 VITALS — BP 124/74 | HR 70 | Temp 98.0°F | Wt 211.4 lb

## 2020-10-01 DIAGNOSIS — R109 Unspecified abdominal pain: Secondary | ICD-10-CM | POA: Diagnosis not present

## 2020-10-01 LAB — POCT URINALYSIS DIPSTICK (MANUAL)
Leukocytes, UA: NEGATIVE
Nitrite, UA: NEGATIVE
Poct Blood: NEGATIVE
Poct Glucose: NORMAL mg/dL
Poct Ketones: NEGATIVE
Poct Protein: NEGATIVE mg/dL
Poct Urobilinogen: 1 mg/dL — AB
Spec Grav, UA: 1.01 (ref 1.010–1.025)
pH, UA: 6 (ref 5.0–8.0)

## 2020-10-01 MED ORDER — VALACYCLOVIR HCL 1 G PO TABS: 1000.0000 mg | ORAL_TABLET | Freq: Three times a day (TID) | ORAL | 0 refills | Status: DC

## 2020-10-01 NOTE — Progress Notes (Signed)
   Subjective:    Patient ID: Mario Meyer, male    DOB: 10-Mar-1961, 59 y.o.   MRN: [id number]  HPI  Patient reports constant dull aching pain on his low right side x 4 days.  Burning on the right side of his abdomen.  It extends from the right side around toward the front.  Sensitive to the touch.  Discomforting.  Denies nausea vomiting diarrhea.  No bloody stools. Results for orders placed or performed in visit on 10/01/20  POCT Urinalysis Dip Manual  Result Value Ref Range   Spec Grav, UA 1.010 1.010 - 1.025   pH, UA 6.0 5.0 - 8.0   Leukocytes, UA Negative Negative   Nitrite, UA Negative Negative   Poct Protein Negative Negative, trace mg/dL   Poct Glucose Normal Normal mg/dL   Poct Ketones Negative Negative   Poct Urobilinogen =1 (A) Normal mg/dL   Poct Bilirubin + (A) Negative   Poct Blood Negative Negative, trace    Review of Systems Please see above    Objective:   Physical Exam Lungs clear heart regular abdomen soft no guarding rebound or tenderness negative pain with rotation flexing bending extending.  Burning discomfort along the right side.  No blistering rash seen  It does not hurt him with movement     Assessment & Plan:  Shingles related discomfort More than likely shingles Doubt kidney stone gallbladder or appendix Valtrex 3 times daily Family patient to give [place] update within the next 5 to 7 days call back if needing pain medicine

## 2020-10-01 NOTE — Patient Instructions (Signed)
Shingles  Shingles is an infection. It gives you a painful skin rash and blisters that have fluid in them. Shingles is caused by the same germ (virus) that causes chickenpox. Shingles only happens in people who:  Have had chickenpox.  Have been given a shot of medicine (vaccine) to protect against chickenpox. Shingles is rare in this group. The first symptoms of shingles may be itching, tingling, or pain in an area on your skin. A rash will show on your skin a few days or weeks later. The rash is likely to be on one side of your body. The rash usually has a shape like a belt or a band. Over time, the rash turns into fluid-filled blisters. The blisters will break open, change into scabs, and dry up. Medicines may:  Help with pain and itching.  Help you get better sooner.  Help to prevent long-term problems. Follow these instructions at home: Medicines  Take over-the-counter and prescription medicines only as told by your doctor.  Put on an anti-itch cream or numbing cream where you have a rash, blisters, or scabs. Do this as told by your doctor. Helping with itching and discomfort   Put cold, wet cloths (cold compresses) on the area of the rash or blisters as told by your doctor.  Cool baths can help you feel better. Try adding baking soda or dry oatmeal to the water to lessen itching. Do not bathe in hot water. Blister and rash care  Keep your rash covered with a loose bandage (dressing).  Wear loose clothing that does not rub on your rash.  Keep your rash and blisters clean. To do this, wash the area with mild soap and cool water as told by your doctor.  Check your rash every day for signs of infection. Check for: ? More redness, swelling, or pain. ? Fluid or blood. ? Warmth. ? Pus or a bad smell.  Do not scratch your rash. Do not pick at your blisters. To help you to not scratch: ? Keep your fingernails clean and cut short. ? Wear gloves or mittens when you sleep, if  scratching is a problem. General instructions  Rest as told by your doctor.  Keep all follow-up visits as told by your doctor. This is important.  Wash your hands often with soap and water. If soap and water are not available, use hand sanitizer. Doing this lowers your chance of getting a skin infection caused by germs (bacteria).  Your infection can cause chickenpox in people who have never had chickenpox or never got a shot of chickenpox vaccine. If you have blisters that did not change into scabs yet, try not to touch other people or be around other people, especially: ? Babies. ? Pregnant women. ? Children who have areas of red, itchy, or rough skin (eczema). ? Very old people who have transplants. ? People who have a long-term (chronic) sickness, like cancer or AIDS. Contact a doctor if:  Your pain does not get better with medicine.  Your pain does not get better after the rash heals.  You have any signs of infection in the rash area. These signs include: ? More redness, swelling, or pain around the rash. ? Fluid or blood coming from the rash. ? The rash area feeling warm to the touch. ? Pus or a bad smell coming from the rash. Get help right away if:  The rash is on your face or nose.  You have pain in your face or pain by   your eye.  You lose feeling on one side of your face.  You have trouble seeing.  You have ear pain, or you have ringing in your ear.  You have a loss of taste.  Your condition gets worse. Summary  Shingles gives you a painful skin rash and blisters that have fluid in them.  Shingles is an infection. It is caused by the same germ (virus) that causes chickenpox.  Keep your rash covered with a loose bandage (dressing). Wear loose clothing that does not rub on your rash.  If you have blisters that did not change into scabs yet, try not to touch other people or be around people. This information is not intended to replace advice given to you by  your health care provider. Make sure you discuss any questions you have with your health care provider. Document Revised: 03/17/2019 Document Reviewed: 07/28/2017 Elsevier Patient Education  2020 [employer]  

## 2020-10-03 ENCOUNTER — Ambulatory Visit: Payer: 59 | Admitting: Family Medicine

## 2020-10-07 ENCOUNTER — Encounter: Payer: Self-pay | Admitting: Internal Medicine

## 2020-10-24 ENCOUNTER — Encounter: Payer: Self-pay | Admitting: Orthopedic Surgery

## 2020-10-24 ENCOUNTER — Other Ambulatory Visit: Payer: Self-pay

## 2020-10-24 ENCOUNTER — Ambulatory Visit (INDEPENDENT_AMBULATORY_CARE_PROVIDER_SITE_OTHER): Payer: 59 | Admitting: Orthopedic Surgery

## 2020-10-24 DIAGNOSIS — Z9889 Other specified postprocedural states: Secondary | ICD-10-CM

## 2020-10-24 MED ORDER — OXYCODONE-ACETAMINOPHEN 5-325 MG PO TABS: 1.0000 | ORAL_TABLET | Freq: Three times a day (TID) | ORAL | 0 refills | Status: AC | PRN

## 2020-10-24 NOTE — Addendum Note (Signed)
Addended byCandice Camp on: 10/24/2020 09:08 AM   Modules accepted: Orders

## 2020-10-24 NOTE — Progress Notes (Signed)
Chief Complaint  Patient presents with   Routine Post Op    right shoulder 07/23/20 still decreased motion     Encounter Diagnosis  Name Primary?   S/P shoulder surgery right biceps tenodesis 07-23-20 Yes    59 year old male status post revision right shoulder surgery.  At the time of the revision his rotator cuff repair was intact he had a revision biceps tenodesis on July 23, 2020.  He also needed a manipulation under anesthesia which he failed leading to the revision surgery.  He is in physical therapy try to improve his motion still requiring heavy amounts of opioid.  Mario Meyer has improved his motion he now has active flexion 100 degrees but he still has scapulothoracic dysrhythmia needs scapula and thoracic and trapezius muscle exercises  We talked about his oxycodone he takes 1 in the morning and maybe 1 at night so we are changing him to Percocet twice daily  Meds ordered this encounter  Medications   oxyCODONE-acetaminophen (PERCOCET/ROXICET) 5-325 MG tablet    Sig: Take 1 tablet by mouth every 8 (eight) hours as needed for up to 7 days for severe pain.    Dispense:  21 tablet    Refill:  0    Continue his therapy follow-up in 8 weeks

## 2020-10-24 NOTE — Patient Instructions (Signed)
Continue exercises resume physical therapy for scapular and trapezius work  Percocet 1 in the morning 1 at night as needed  Follow-up 8 weeks

## 2020-11-04 ENCOUNTER — Ambulatory Visit: Payer: 59 | Admitting: Family Medicine

## 2020-11-13 ENCOUNTER — Ambulatory Visit: Payer: 59 | Admitting: Gastroenterology

## 2020-12-19 ENCOUNTER — Other Ambulatory Visit: Payer: Self-pay

## 2020-12-19 ENCOUNTER — Ambulatory Visit: Payer: 59 | Admitting: Orthopedic Surgery

## 2020-12-19 ENCOUNTER — Encounter: Payer: Self-pay | Admitting: Orthopedic Surgery

## 2020-12-19 VITALS — BP 141/81 | HR 70 | Ht 73.0 in | Wt 211.0 lb

## 2020-12-19 DIAGNOSIS — Z9889 Other specified postprocedural states: Secondary | ICD-10-CM

## 2020-12-19 NOTE — Patient Instructions (Signed)
Just use a pulley daily   Fu 2 months

## 2020-12-19 NOTE — Progress Notes (Signed)
Chief Complaint  Patient presents with  . Shoulder Pain    Right / physical therapy has discharged him to to no progress     60 year old male status post multiple surgeries right shoulder still not regaining motion  Encounter Diagnoses  Name Primary?  . S/P shoulder surgery right biceps tenodesis 07-23-20 Yes  . S/P arthroscopy of right shoulder  biceps tenodesis 08/31/19   . Status post right rotator cuff repair// MUA 04/16/20     Therapist help basically given up on trying to improve his status  He does not need a manipulation, therapy mention that.  He has good passive motion painful but poor active motion with pseudoparalysis  Recommend pulleys to improve his motion and strength follow-up in 2 months

## 2020-12-24 ENCOUNTER — Ambulatory Visit: Payer: 59 | Admitting: Gastroenterology

## 2020-12-25 ENCOUNTER — Encounter: Payer: Self-pay | Admitting: Internal Medicine

## 2021-01-01 ENCOUNTER — Encounter: Payer: Self-pay | Admitting: Orthopedic Surgery

## 2021-01-30 ENCOUNTER — Encounter: Payer: Self-pay | Admitting: Orthopedic Surgery

## 2021-01-30 ENCOUNTER — Other Ambulatory Visit: Payer: Self-pay

## 2021-01-30 ENCOUNTER — Ambulatory Visit: Payer: 59 | Admitting: Orthopedic Surgery

## 2021-01-30 VITALS — BP 146/83 | HR 70 | Ht 73.0 in | Wt 215.0 lb

## 2021-01-30 DIAGNOSIS — Z9889 Other specified postprocedural states: Secondary | ICD-10-CM

## 2021-01-30 NOTE — Progress Notes (Signed)
Chief Complaint  Patient presents with  . Shoulder Pain    R/ pain weakness motion poor   Encounter Diagnoses  Name Primary?  . S/P shoulder surgery right biceps tenodesis 07-23-20 Yes  . S/P arthroscopy of right shoulder  biceps tenodesis 08/31/19   . Status post right rotator cuff repair// MUA 04/16/20     Legend is not doing well he still has pain in the front of his shoulder he has no significant weakness and loss of motion actively, his passive motion is painful at 120 degrees of flexion he has limited external rotation when his arm is at his side of only about 30 degrees  Unclear based on the preop findings of intact rotator cuff Y he is having such an issue  He appears to have healed his cuff repair and after his biceps tendon was tenodesed it would seem that this would have resolved but it has not  He will continue with his home exercises and I will see him in 3 months 07/23/2020  PATIENT:  Mario Meyer  60 y.o. male  PRE-OPERATIVE DIAGNOSIS:  Right shoulder rotator cuff tear / biceps tendonitis  POST-OPERATIVE DIAGNOSIS:  Right shoulder biceps tendonitis  PROCEDURE:  Procedure(s): RIGHT SHOULDER OPEN BICEPS TENODESIS (Right)   Operative findings The rotator cuff repair was intact The patient had internal rotation contracture with 0 degrees external rotation with his arm at the side preop under anesthesia The biceps tendon was severely diseased with disease in the bicipital groove as well as the joint it was severely attenuated  Implant swivel lock anchor with luggage suture construct Arthrex

## 2021-02-03 ENCOUNTER — Ambulatory Visit: Payer: 59 | Admitting: Gastroenterology

## 2021-02-06 ENCOUNTER — Encounter: Payer: Self-pay | Admitting: Orthopedic Surgery

## 2021-03-11 ENCOUNTER — Ambulatory Visit: Payer: 59 | Admitting: Gastroenterology

## 2021-04-04 ENCOUNTER — Other Ambulatory Visit: Payer: Self-pay | Admitting: Family Medicine

## 2021-04-15 ENCOUNTER — Ambulatory Visit: Payer: 59 | Admitting: Gastroenterology

## 2021-05-01 ENCOUNTER — Telehealth: Payer: Self-pay | Admitting: Orthopedic Surgery

## 2021-05-01 ENCOUNTER — Encounter: Payer: Self-pay | Admitting: Orthopedic Surgery

## 2021-05-01 ENCOUNTER — Other Ambulatory Visit: Payer: Self-pay

## 2021-05-01 ENCOUNTER — Ambulatory Visit: Payer: 59 | Admitting: Orthopedic Surgery

## 2021-05-01 ENCOUNTER — Other Ambulatory Visit: Payer: Self-pay | Admitting: Orthopedic Surgery

## 2021-05-01 VITALS — BP 123/81 | HR 66 | Ht 73.0 in | Wt 211.4 lb

## 2021-05-01 DIAGNOSIS — M75111 Incomplete rotator cuff tear or rupture of right shoulder, not specified as traumatic: Secondary | ICD-10-CM

## 2021-05-01 DIAGNOSIS — Z9889 Other specified postprocedural states: Secondary | ICD-10-CM

## 2021-05-01 MED ORDER — MELOXICAM 7.5 MG PO TABS: 7.5000 mg | ORAL_TABLET | Freq: Every day | ORAL | 5 refills | Status: DC

## 2021-05-01 NOTE — Telephone Encounter (Signed)
Patient wants to know if Dr. Aline Brochure will send in some "arthritis medication" to his pharmayc?  [facility] uses [facility] [place]

## 2021-05-01 NOTE — Progress Notes (Signed)
Follow-up visit  Chief Complaint  Patient presents with  . Shoulder Pain    RIGHT shoulder//follow up    Status post biceps tenodesis July 23, 2020 arthroscopy right shoulder August 31, 2019 manipulation under anesthesia 04/16/2020  Patient complains of pain on the right shoulder and inability sleep on the right side limited range of motion  Patient has received disability from the state based on his shoulder problem  Recommend yearly follow-up to update his forms otherwise no follow-up needed

## 2021-07-16 ENCOUNTER — Telehealth: Payer: Self-pay | Admitting: Family Medicine

## 2021-07-16 NOTE — Telephone Encounter (Signed)
Please advise. Thank you

## 2021-07-16 NOTE — Telephone Encounter (Signed)
Nurses-please touch base with patient make sure he is feeling okay-in other words not feeling severely depressed or suicidal-because if that is the case we would have to see him emergently otherwise may have 30 days needs to schedule follow-up office visit with [place] please thank you

## 2021-07-16 NOTE — Telephone Encounter (Signed)
Patient is requesting refill on sertraline 50 mg last filled 09/23/20 he was last seen 10/01/20 for side pain. [facility] Please advise

## 2021-07-17 MED ORDER — SERTRALINE HCL 50 MG PO TABS
50.0000 mg | ORAL_TABLET | Freq: Every day | ORAL | 0 refills | Status: DC
Start: 2021-07-17 — End: 2021-07-22

## 2021-07-17 NOTE — Telephone Encounter (Signed)
Left message to return call 

## 2021-07-17 NOTE — Telephone Encounter (Signed)
Prescription set electronically to pharmacy. Patient notified and confirmed he was not severely depressed or suicidal. Patient scheduled phone visit with Dr Nicki Reaper 07/22/21 at 11:20am

## 2021-07-17 NOTE — Telephone Encounter (Signed)
Let the patient know that we would like to help  Please talk with the patient to make sure that he is not feeling severely depressed or suicidal if so I would need to know that  Otherwise if it is mainly just feeling anxious I recommend the following  I would recommend starting off with sertraline 50 mg 1 daily to allow his body to get used to it may send in 14 tablets I would also like to do a visit with the patient either in person or virtual or by phone by next week.  May be at the end of the day if that is most convenient for the patient. At that consultation if things are going well we will discuss going up on the dose to 100 mg daily ongoing Thanks-Dr. Nicki Reaper

## 2021-07-22 ENCOUNTER — Telehealth: Payer: Self-pay | Admitting: *Deleted

## 2021-07-22 ENCOUNTER — Other Ambulatory Visit: Payer: Self-pay

## 2021-07-22 ENCOUNTER — Telehealth (INDEPENDENT_AMBULATORY_CARE_PROVIDER_SITE_OTHER): Payer: 59 | Admitting: Family Medicine

## 2021-07-22 DIAGNOSIS — F411 Generalized anxiety disorder: Secondary | ICD-10-CM

## 2021-07-22 MED ORDER — CLORAZEPATE DIPOTASSIUM 3.75 MG PO TABS: ORAL_TABLET | ORAL | 0 refills | Status: DC

## 2021-07-22 MED ORDER — SERTRALINE HCL 100 MG PO TABS
100.0000 mg | ORAL_TABLET | Freq: Every day | ORAL | 3 refills | Status: DC
Start: 2021-07-22 — End: 2021-10-14

## 2021-07-22 NOTE — Progress Notes (Signed)
   Subjective:    Patient ID: Mario Meyer, male    DOB: [birth year], 60 y.o.   MRN: [id number]  HPI  Patient calls for a follow up on anxiety. Patient stated he was heading to pick up the zoloft Terrible situation for the patient His wife left him in early April after she was-according to him-cheating He states it was very difficult for several weeks now he is working his way through it.  He states there is no hope of them getting back together He denies being depressed more just feeling anxious and nervous he states in the past he did well with Zoloft he denies being suicidal Virtual Visit via Video Note  I connected with Mario Meyer on 07/22/21 at 11:20 AM EDT by a video enabled telemedicine application and verified that I am speaking with the correct person using two identifiers.  Location: Patient: home Provider: office   I discussed the limitations of evaluation and management by telemedicine and the availability of in person appointments. The patient expressed understanding and agreed to proceed.  History of Present Illness:    Observations/Objective:   Assessment and Plan:   Follow Up Instructions:    I discussed the assessment and treatment plan with the patient. The patient was provided an opportunity to ask questions and all were answered. The patient agreed with the plan and demonstrated an understanding of the instructions.   The patient was advised to call back or seek an in-person evaluation if the symptoms worsen or if the condition fails to improve as anticipated.  I provided 15 minutes of non-face-to-face time during this encounter.     Review of Systems     Objective:   Physical Exam  Today's visit was via telephone Physical exam was not possible for this visit       Assessment & Plan:  GAD Zoloft 50 mg 1 daily for the first week cautioned nausea if ongoing troubles or progressive troubles notify us otherwise we will do a follow-up phone  visit in 2 to 3 weeks  In addition to this patient not depressed but I did talk with him that if he starts finding himself depressed or feeling suicidal to follow-up immediately with phone call or ER visit  Patient will notify us if any changes  Tranxene may be utilized when necessary for severe anxiety at home only caution drowsiness use sparingly

## 2021-07-22 NOTE — Telephone Encounter (Signed)
Mr. life, simic are scheduled for a virtual visit with your provider today.    Just as we do with appointments in the office, we must obtain your consent to participate.  Your consent will be active for this visit and any virtual visit you may have with one of our providers in the next 365 days.    If you have a MyChart account, I can also send a copy of this consent to you electronically.  All virtual visits are billed to your insurance company just like a traditional visit in the office.  As this is a virtual visit, video technology does not allow for your provider to perform a traditional examination.  This may limit your provider's ability to fully assess your condition.  If your provider identifies any concerns that need to be evaluated in person or the need to arrange testing such as labs, EKG, etc, we will make arrangements to do so.    Although advances in technology are sophisticated, we cannot ensure that it will always work on either your end or our end.  If the connection with a video visit is poor, we may have to switch to a telephone visit.  With either a video or telephone visit, we are not always able to ensure that we have a secure connection.   I need to obtain your verbal consent now.   Are you willing to proceed with your visit today?   DEMARIOUS YOUSEF has provided verbal consent on 07/22/2021 for a virtual visit (video or telephone).

## 2021-08-04 ENCOUNTER — Ambulatory Visit: Payer: 59 | Admitting: Gastroenterology

## 2021-08-19 ENCOUNTER — Other Ambulatory Visit: Payer: Self-pay

## 2021-08-19 ENCOUNTER — Telehealth (INDEPENDENT_AMBULATORY_CARE_PROVIDER_SITE_OTHER): Payer: 59 | Admitting: Family Medicine

## 2021-08-19 ENCOUNTER — Telehealth: Payer: Self-pay

## 2021-08-19 DIAGNOSIS — Z1159 Encounter for screening for other viral diseases: Secondary | ICD-10-CM

## 2021-08-19 DIAGNOSIS — F411 Generalized anxiety disorder: Secondary | ICD-10-CM

## 2021-08-19 DIAGNOSIS — Z114 Encounter for screening for human immunodeficiency virus [HIV]: Secondary | ICD-10-CM

## 2021-08-19 DIAGNOSIS — Z125 Encounter for screening for malignant neoplasm of prostate: Secondary | ICD-10-CM

## 2021-08-19 DIAGNOSIS — E782 Mixed hyperlipidemia: Secondary | ICD-10-CM

## 2021-08-19 NOTE — Telephone Encounter (Signed)
I connected with  Mario Meyer on 08/19/21 by a video enabled telemedicine application and verified that I am speaking with the correct person using two identifiers.   I discussed the limitations of evaluation and management by telemedicine. The patient expressed understanding and agreed to proceed.

## 2021-08-19 NOTE — Progress Notes (Signed)
   Subjective:  I connected with  Mario Meyer on 08/19/21 by a video enabled telemedicine application and verified that I am speaking with the correct person using two identifiers.   I discussed the limitations of evaluation and management by telemedicine. The patient expressed understanding and agreed to proceed.  Patient location: home  Provider location: in office  I provided 10 minutes of non face - to - face time during this encounter.   Patient ID: Mario Meyer, male    DOB: 1961-11-29, 60 y.o.   MRN: [id number]  Anxiety    Follow up - voices no concerns Patient feels he is doing well with the medicine focus is doing good denies being depressed states anxiety under good control he is learning to live through the separation from his wife denies feeling depressed denies feeling that he is going to be of harm to himself  Review of Systems     Objective:   Physical Exam  Today's visit was via telephone Physical exam was not possible for this visit       Assessment & Plan:  Anxiety Doing well with the medicine Patient will do lab work this fall and a wellness visit at that time

## 2021-08-21 ENCOUNTER — Ambulatory Visit: Payer: 59 | Admitting: Orthopedic Surgery

## 2021-08-21 ENCOUNTER — Other Ambulatory Visit: Payer: Self-pay

## 2021-08-21 ENCOUNTER — Encounter: Payer: Self-pay | Admitting: Orthopedic Surgery

## 2021-08-21 VITALS — Ht 73.0 in | Wt 216.0 lb

## 2021-08-21 DIAGNOSIS — Z9889 Other specified postprocedural states: Secondary | ICD-10-CM

## 2021-08-21 NOTE — Progress Notes (Signed)
Chief Complaint  Patient presents with   Routine Post Op    S/p Rt bicep sx DOS 07/23/20    60 year old male failed rotator cuff repair including manipulation and rerepair  Still having chronic pain and loss of motion and weakness right shoulder he is unable to work or do activities which involve lifting or moving or holding anything in his right upper extremity  He has forms to fill out which we will of course do but he still out of work indefinitely if not permanently

## 2021-09-30 ENCOUNTER — Other Ambulatory Visit: Payer: Self-pay

## 2021-09-30 ENCOUNTER — Telehealth: Payer: Self-pay | Admitting: Family Medicine

## 2021-09-30 DIAGNOSIS — F411 Generalized anxiety disorder: Secondary | ICD-10-CM

## 2021-09-30 NOTE — Telephone Encounter (Signed)
Patient has appointment 11/8 but wanting a referral to psychiatry in [place] . He is wanting to see Dr. Modesta Messing

## 2021-09-30 NOTE — Telephone Encounter (Signed)
Referral to Dr Modesta Messing placed per drs orders.

## 2021-09-30 NOTE — Telephone Encounter (Signed)
Patient is requesting a referral to Dr Norman Clay for anxiety and stress, patient states is stable, but stressed with work and medical problems w/ his arm. Has upcoming appt

## 2021-09-30 NOTE — Telephone Encounter (Signed)
Plz do so 

## 2021-10-06 ENCOUNTER — Telehealth: Payer: Self-pay | Admitting: Family Medicine

## 2021-10-06 DIAGNOSIS — F439 Reaction to severe stress, unspecified: Secondary | ICD-10-CM

## 2021-10-06 DIAGNOSIS — F411 Generalized anxiety disorder: Secondary | ICD-10-CM

## 2021-10-06 NOTE — Telephone Encounter (Signed)
Please advise. Thank you

## 2021-10-06 NOTE — Telephone Encounter (Signed)
Patient called today and asked about his referral to Dr. Modesta Messing. He was wondering if that has been approved. Please advise.  [phone]

## 2021-10-08 NOTE — Telephone Encounter (Signed)
Referral placed in Epic. Thank you.

## 2021-10-14 ENCOUNTER — Telehealth: Payer: Self-pay | Admitting: Family Medicine

## 2021-10-14 ENCOUNTER — Ambulatory Visit: Payer: 59 | Admitting: Family Medicine

## 2021-10-14 ENCOUNTER — Other Ambulatory Visit: Payer: Self-pay

## 2021-10-14 ENCOUNTER — Encounter: Payer: Self-pay | Admitting: Family Medicine

## 2021-10-14 VITALS — BP 124/70 | Temp 98.4°F | Wt 215.2 lb

## 2021-10-14 DIAGNOSIS — E782 Mixed hyperlipidemia: Secondary | ICD-10-CM | POA: Diagnosis not present

## 2021-10-14 DIAGNOSIS — Z1159 Encounter for screening for other viral diseases: Secondary | ICD-10-CM

## 2021-10-14 DIAGNOSIS — Z125 Encounter for screening for malignant neoplasm of prostate: Secondary | ICD-10-CM

## 2021-10-14 DIAGNOSIS — E785 Hyperlipidemia, unspecified: Secondary | ICD-10-CM

## 2021-10-14 DIAGNOSIS — Z79899 Other long term (current) drug therapy: Secondary | ICD-10-CM

## 2021-10-14 DIAGNOSIS — Z114 Encounter for screening for human immunodeficiency virus [HIV]: Secondary | ICD-10-CM

## 2021-10-14 MED ORDER — MIRTAZAPINE 7.5 MG PO TABS
7.5000 mg | ORAL_TABLET | Freq: Every day | ORAL | 3 refills | Status: DC
Start: 2021-10-14 — End: 2021-11-11

## 2021-10-14 NOTE — Telephone Encounter (Signed)
FYI: Pt is wanting to see Dr.Hisada. Per Loma Sousa note on referral [facility] does not have an Adult psychiatrist at this time. Googled Dr.Hisada and it states she is now at [place]. Made pt aware of this and also sent Loma Sousa a message and she is going to call [facility] to verify is Dr.Hisada is there or not.

## 2021-10-14 NOTE — Patient Instructions (Signed)
Stop sertraline  Start rmeron 7.5 one each evening  We will get you in with behavioral health  Recheck here in 3 months

## 2021-10-14 NOTE — Progress Notes (Signed)
Subjective:    Patient ID: Mario Meyer, male    DOB: [birth year], 60 y.o.   MRN: [id number]  HPI Pt here to follow up on anxiety. Pt taking Zoloft 100 mg daily. Pt states he is under a lot of pressure and believes the med is not working properly. Pt states he is unable to sleep and feels jittery in chest area.  Patient states he finds himself feeling anxious other times finds himself stressed out denies being suicidal.  States sometimes things are so tough but he wonders if life is worth living but has no desire to hurt himself  Pt having issues with right arm. Pt is currently out of work and is needing more documentation as to why he can not return to work at this time.  Patient has had previous surgeries x2 in despite physical therapy and surgery.  Limited range of motion cannot raise his arm to the side has impingement when he tries to raise it forward unable to bring his arm across his body unable to bring his arm behind his body unable to raise his arm above  70 degrees laterally.  Has no strength in his shoulder or girdle muscles on that side  Review of Systems     Objective:   Physical Exam General-in no acute distress Eyes-no discharge Lungs-respiratory rate normal, CTA CV-no murmurs,RRR Extremities skin warm dry no edema Neuro grossly normal Behavior normal, alert  Significant range of motion issues unable to extend laterally unable to go behind his back unable to go across his opposite shoulder unable to raise above shoulder level cannot go over his head at all unable to do any type of significant strength exercises because of pain discomfort and disability. The patient is motivated to return to normal health but is aware that his shoulder has permanent disability.  He is not malingering.     Assessment & Plan:  This gentleman has disability from the [employer].  It would be impossible for him to return to work with this issue.  Because of the severe amount of  shoulder pain and restricted range of motion and restrictions regarding lifting.  His orthopedist states that he cannot lift more than 5 pounds.  Plus patient cannot do this frequently because of shoulder pain and weakened arm.  Because of all this there is no way that this gentleman could return to work at [employer].  In my opinion he is completely disabled.  I believe that his disability is permanent.  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  His job requires him to do forceful movements with the right arm lifting heavy objects and manipulating things with his hands.  He is right side dominant.  There is no way that he can do his job.  Also I do not feel there are any reasonable accommodations that work could make to allow him to return to work at his job.  I also believe that the patient may need to get a lawyer if he feels that his situation is being taken advantage.  Moderate anxiety and stress related to his work situation as well as related to the fact his wife left him.  Patient is not suicidal.  We talked about trying different medicines.  I believe the Zoloft is making it difficult for him to sleep at night.  We will try Remeron 7.5 mg nightly.  We will also reinitiate the referral to behavioral health he has not heard anything from them yet.  Recheck  in 4 weeks.

## 2021-10-15 NOTE — Telephone Encounter (Signed)
Thank you :)

## 2021-10-16 LAB — HEPATIC FUNCTION PANEL
ALT: 18 IU/L (ref 0–44)
AST: 11 IU/L (ref 0–40)
Albumin: 4.4 g/dL (ref 3.8–4.9)
Alkaline Phosphatase: 106 IU/L (ref 44–121)
Bilirubin Total: 0.6 mg/dL (ref 0.0–1.2)
Bilirubin, Direct: 0.16 mg/dL (ref 0.00–0.40)
Total Protein: 6.9 g/dL (ref 6.0–8.5)

## 2021-10-16 LAB — BASIC METABOLIC PANEL
BUN/Creatinine Ratio: 18 (ref 9–20)
BUN: 13 mg/dL (ref 6–24)
CO2: 25 mmol/L (ref 20–29)
Calcium: 9.7 mg/dL (ref 8.7–10.2)
Chloride: 103 mmol/L (ref 96–106)
Creatinine, Ser: 0.73 mg/dL — ABNORMAL LOW (ref 0.76–1.27)
Glucose: 112 mg/dL — ABNORMAL HIGH (ref 70–99)
Potassium: 4.8 mmol/L (ref 3.5–5.2)
Sodium: 142 mmol/L (ref 134–144)
eGFR: 105 mL/min/{1.73_m2} (ref >59)

## 2021-10-16 LAB — LIPID PANEL
Chol/HDL Ratio: 3.4 ratio (ref 0.0–5.0)
Cholesterol, Total: 181 mg/dL (ref 100–199)
HDL: 54 mg/dL (ref >39)
LDL Chol Calc (NIH): 108 mg/dL — ABNORMAL HIGH (ref 0–99)
Triglycerides: 108 mg/dL (ref 0–149)
VLDL Cholesterol Cal: 19 mg/dL (ref 5–40)

## 2021-10-16 LAB — PSA: Prostate Specific Ag, Serum: 3.6 ng/mL (ref 0.0–4.0)

## 2021-10-16 LAB — HEPATITIS C ANTIBODY: Hep C Virus Ab: <0.1 s/co ratio (ref 0.0–0.9)

## 2021-10-16 LAB — HIV ANTIBODY (ROUTINE TESTING W REFLEX): HIV Screen 4th Generation wRfx: NONREACTIVE

## 2021-10-26 ENCOUNTER — Encounter: Payer: Self-pay | Admitting: Family Medicine

## 2021-10-26 ENCOUNTER — Telehealth: Payer: Self-pay | Admitting: Family Medicine

## 2021-10-26 NOTE — Telephone Encounter (Signed)
I dictated a letter.  Please send it with the letter that he sent me from [facility].(I have flagged this with his sticky note for it to go back to the front.)  Thank you

## 2021-11-07 ENCOUNTER — Other Ambulatory Visit: Payer: Self-pay | Admitting: Family Medicine

## 2021-11-11 ENCOUNTER — Encounter: Payer: Self-pay | Admitting: Family Medicine

## 2021-11-11 ENCOUNTER — Other Ambulatory Visit: Payer: Self-pay

## 2021-11-11 ENCOUNTER — Ambulatory Visit: Payer: 59 | Admitting: Family Medicine

## 2021-11-11 VITALS — BP 128/70 | HR 67 | Temp 97.3°F | Ht 73.0 in | Wt 218.0 lb

## 2021-11-11 DIAGNOSIS — G8929 Other chronic pain: Secondary | ICD-10-CM | POA: Diagnosis not present

## 2021-11-11 DIAGNOSIS — M25511 Pain in right shoulder: Secondary | ICD-10-CM | POA: Diagnosis not present

## 2021-11-11 MED ORDER — MIRTAZAPINE 15 MG PO TABS
15.0000 mg | ORAL_TABLET | Freq: Every day | ORAL | 5 refills | Status: DC
Start: 2021-11-11 — End: 2021-12-14

## 2021-11-11 NOTE — Progress Notes (Signed)
   Subjective:    Patient ID: Mario Meyer, male    DOB: 1961-04-17, 60 y.o.   MRN: [id number]  HPI 4 week follow up for disability - shoulder pain and stress/ anxiety , referral to psychiatry  This patient is permanently disabled Has had multiple surgeries on his rotator cuff and surgeries on the bicep tendon as well as physical therapy He is left with severe disability inability to use the right arm and right shoulder well.  Has chronic pain and discomfort Has difficulty sleeping because of the pain Finds himself also feeling anxious and stressed because of this issue as well Will be seeing psychiatry in the near future   Review of Systems     Objective:   Physical Exam Right shoulder scarring Limited ROM, complains of pain and discomfort with movement Lungs are clear heart regular BP is good      Assessment & Plan:  Chronic right shoulder pain-he is going through numerous surgeries physical therapy without success.  He has been under the care of Dr. Arther Abbott who has stated that he is not able to go back to work [employer] has also determined that he is unable to go back to work In my opinion he is disabled.  I agree with the federal government.  There is nothing that has changed that has improved his situation.  He is unable to go back to work at [employer]. We will write a note to that effect  Disabled  Referral to psychiatry for anxiety and depression related to the stress of his health issues

## 2021-11-17 ENCOUNTER — Telehealth: Payer: Self-pay | Admitting: Family Medicine

## 2021-11-17 DIAGNOSIS — F411 Generalized anxiety disorder: Secondary | ICD-10-CM

## 2021-11-17 NOTE — Telephone Encounter (Signed)
Patient calling checking on letter that suppose to be ready today for him to pick up also check on referral you spoke on at last visit.

## 2021-11-18 ENCOUNTER — Encounter: Payer: Self-pay | Admitting: Family Medicine

## 2021-11-18 NOTE — Telephone Encounter (Signed)
Letter printed and pt contacted. Referral placed. Pt verbalized understanding. Letter placed up front for pick up

## 2021-11-18 NOTE — Telephone Encounter (Signed)
A letter was written please provide to him Also referral to psychiatry for depression and anxiety he prefers [place] if possible otherwise [place]

## 2021-12-12 ENCOUNTER — Other Ambulatory Visit: Payer: Self-pay | Admitting: Family Medicine

## 2021-12-17 ENCOUNTER — Telehealth: Payer: Self-pay | Admitting: Family Medicine

## 2021-12-17 ENCOUNTER — Other Ambulatory Visit: Payer: Self-pay

## 2021-12-17 ENCOUNTER — Ambulatory Visit (INDEPENDENT_AMBULATORY_CARE_PROVIDER_SITE_OTHER): Payer: 59 | Admitting: Family Medicine

## 2021-12-17 DIAGNOSIS — J019 Acute sinusitis, unspecified: Secondary | ICD-10-CM | POA: Diagnosis not present

## 2021-12-17 MED ORDER — AMOXICILLIN-POT CLAVULANATE 875-125 MG PO TABS: 1.0000 | ORAL_TABLET | Freq: Two times a day (BID) | ORAL | 0 refills | Status: DC

## 2021-12-17 NOTE — Progress Notes (Signed)
° °  Subjective:    Patient ID: Mario Meyer, male    DOB: 1961/02/05, 61 y.o.   MRN: [id number]  HPI Pt having cough, runny nose, eyes have been running, headache, congestion, ear pain, nausea, and back pain. Pt states he has "felt like crap" for the past few days. No recent testing. Taking cold medication but not helping.  He states he has had this going on for approximately 10 to 12 days a lot of head congestion sinus pressure Patient denies any wheezing or shortness of breath Virtual Visit via Telephone Note  I connected with Mario Meyer on 12/17/21 at 11:20 AM EST by telephone and verified that I am speaking with the correct person using two identifiers.  Location: Patient: home Provider: office   I discussed the limitations, risks, security and privacy concerns of performing an evaluation and management service by telephone and the availability of in person appointments. I also discussed with the patient that there may be a patient responsible charge related to this service. The patient expressed understanding and agreed to proceed.   History of Present Illness:    Observations/Objective:   Assessment and Plan:   Follow Up Instructions:    I discussed the assessment and treatment plan with the patient. The patient was provided an opportunity to ask questions and all were answered. The patient agreed with the plan and demonstrated an understanding of the instructions.   The patient was advised to call back or seek an in-person evaluation if the symptoms worsen or if the condition fails to improve as anticipated.  I provided  15 minutes of non-face-to-face time during this encounter.       Review of Systems     Objective:   Physical Exam Today's visit was via telephone Physical exam was not possible for this visit        Assessment & Plan:  I did recommend COVID testing-she is to do a home test If he experiences shortness of breath wheezing or difficulty  breathing to follow-up Otherwise we will go ahead and treat with antibiotics.  Warning signs discussed.

## 2021-12-17 NOTE — Telephone Encounter (Signed)
Mario Meyer, Mario Meyer are scheduled for a virtual visit with your provider today.    Just as we do with appointments in the office, we must obtain your consent to participate.  Your consent will be active for this visit and any virtual visit you may have with one of our providers in the next 365 days.    If you have a MyChart account, I can also send a copy of this consent to you electronically.  All virtual visits are billed to your insurance company just like a traditional visit in the office.  As this is a virtual visit, video technology does not allow for your provider to perform a traditional examination.  This may limit your provider's ability to fully assess your condition.  If your provider identifies any concerns that need to be evaluated in person or the need to arrange testing such as labs, EKG, etc, we will make arrangements to do so.    Although advances in technology are sophisticated, we cannot ensure that it will always work on either your end or our end.  If the connection with a video visit is poor, we may have to switch to a telephone visit.  With either a video or telephone visit, we are not always able to ensure that we have a secure connection.   I need to obtain your verbal consent now.   Are you willing to proceed with your visit today?   Mario Meyer has provided verbal consent on 12/17/2021 for a virtual visit (video or telephone).   Vicente Males, LPN 8/50/2774  12:87 AM

## 2021-12-23 ENCOUNTER — Ambulatory Visit: Payer: 59 | Admitting: Family Medicine

## 2021-12-23 ENCOUNTER — Telehealth: Payer: Self-pay | Admitting: Family Medicine

## 2021-12-23 ENCOUNTER — Telehealth: Payer: Self-pay | Admitting: Radiology

## 2021-12-23 ENCOUNTER — Other Ambulatory Visit: Payer: Self-pay

## 2021-12-23 ENCOUNTER — Encounter: Payer: Self-pay | Admitting: Family Medicine

## 2021-12-23 VITALS — BP 122/69 | HR 68 | Temp 98.4°F | Ht 73.0 in | Wt 222.0 lb

## 2021-12-23 DIAGNOSIS — M25511 Pain in right shoulder: Secondary | ICD-10-CM | POA: Diagnosis not present

## 2021-12-23 DIAGNOSIS — G8929 Other chronic pain: Secondary | ICD-10-CM

## 2021-12-23 NOTE — Telephone Encounter (Signed)
Medical Review Service wanting to speak with you about patient disability benefits due to illness or injury . They sent over a paper to you to fill out when you can speak with them on this patient in your box.

## 2021-12-23 NOTE — Progress Notes (Signed)
° °  Subjective:    Patient ID: Mario Meyer, male    DOB: 1961/06/15, 61 y.o.   MRN: [id number]  HPI 6 week follow on right shoulder pain Patient has ongoing pain from the neck into the trapezius into the shoulder Has limited range of motion of his right arm.  Significant pain and discomfort.  Unable to do any lifting.  Unable to do any repetitive motions States pain make it difficult for him to get dressed, bathe, fix food, do activities around the yard or house Patient has gone through surgeries physical therapy evaluation by orthopedist they feel there is nothing else that can be done for him. He is also disabled by the federal government under [employer]. 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  Going back to do work there would be not possible.  He is permanently disabled.  - and anxiety/ depression follow up -patient's moods are starting to do better.  Denies being depressed.  Patient did go through significant anxiety and depression due to his disability as well as due to the fact his wife reportedly cheated on him and left  Patient previously saw orthopedics and they told him that there is nothing else they could do other than the possibility of shoulder replacement-even with the shoulder replacement they said that he would be extremely limited.  Last orthopedic note in September stated that he is out of work indefinitely if not permanently By [employer] he is disabled. Review of Systems     Objective:   Physical Exam Decreased range of motion of the right arm.  Unable to bring it no more than 10 to 15 degrees up.  Patient is able to use his right hand but cannot do any forceful gripping lifting pushing pulling because of the right shoulder pain       Assessment & Plan:  Shoulder pain and discomfort Severe limitations of range of motion and activity. Patient is disabled Unable to work Is followed by orthopedics They felt the only  additional surgery they could do would be shoulder replacement surgery but they did not feel that that would allow him to return to work The patient is chosen at this point time not to get further surgery  Major depression doing much better in remission currently continue medication till March then at that point in time if doing well stop the medication

## 2021-12-23 NOTE — Telephone Encounter (Signed)
Mickel Baas from [place] called and asked to speak with you directly.  She has questions about the disability form that was sent in.  I have printed the form and this note and placed in your inbox downstairs, for your reference when calling her back.  If you would rather not call her, I can certainly call and try to give her what she needs.  Just let me know, thanks.  Abigail Butts

## 2021-12-26 ENCOUNTER — Telehealth: Payer: Self-pay | Admitting: Orthopedic Surgery

## 2021-12-26 NOTE — Telephone Encounter (Addendum)
Voice message and fax received from [facility], [address] ph [phone]/fax# [phone], company for [employer]be [employer], requesting a Peer to Peer Review with Dr Aline Brochure, for disability appeal. We returned call to a Direct phone # received on voice mail from a Dr (was unable to hear the physician's name) at [phone]. I returned call to acknowledge message, and it sounded like a fax tone. Placed faxed correspondence in Dr Ruthe Mannan box/ CC message to practice administrator Abigail Butts.

## 2021-12-28 ENCOUNTER — Encounter: Payer: Self-pay | Admitting: Family Medicine

## 2021-12-28 NOTE — Telephone Encounter (Signed)
Please send a copy of the letter to [facility] disability.  Please also send them the form that I filled in thank you  (This patient is disabled.  I stand by that opinion.  There is absolutely no reason to be meeting with additional reviewer's of his disability.  He has been declared disabled by [employer].  Also my notes reflect that he is disabled.  We will not be meeting with or having phone consultation with [address] or [place] disability.  I did inform the patient that in my opinion he is disabled and further consultation with other entities will not change that.)

## 2021-12-29 ENCOUNTER — Telehealth: Payer: Self-pay | Admitting: Radiology

## 2021-12-29 NOTE — Telephone Encounter (Signed)
Form and letter were faxed over this morning

## 2021-12-29 NOTE — Telephone Encounter (Signed)
Dr Lyndel Safe called, asked if you would please call him about this patient's disability from 08/28/21.

## 2021-12-29 NOTE — Telephone Encounter (Signed)
Noted  

## 2022-01-02 ENCOUNTER — Telehealth: Payer: Self-pay | Admitting: Orthopedic Surgery

## 2022-01-02 NOTE — Telephone Encounter (Signed)
Spoke to dr Lyndel Safe  He said he needed to know why he was still disabled  My response was that he has a failed rotator cuff surgery with multiple attempts at repair which all unsuccessful  He basically cant move his arm

## 2022-01-05 ENCOUNTER — Ambulatory Visit: Payer: 59 | Admitting: Gastroenterology

## 2022-01-05 NOTE — Progress Notes (Deleted)
Primary Care Physician:  Kathyrn Drown, MD  Primary Gastroenterologist:    No chief complaint on file.   HPI:  Mario Meyer is a 61 y.o. male here to schedule surveillance colonoscopy.  Last colonoscopy in 2016.  He had a couple polyps removed, one tubular adenoma.  Current Outpatient Medications  Medication Sig Dispense Refill   amoxicillin-clavulanate (AUGMENTIN) 875-125 MG tablet Take 1 tablet by mouth 2 (two) times daily. 20 tablet 0   aspirin EC 81 MG tablet Take 81 mg by mouth daily.      clobetasol cream (TEMOVATE) 2.95 % Apply 1 application topically as directed.     clorazepate (TRANXENE) 3.75 MG tablet One half to one tablet BID PRN anxiety- Home use only 14 tablet 0   ketoconazole (NIZORAL) 2 % cream Apply 1 application topically 2 (two) times daily.     meloxicam (MOBIC) 7.5 MG tablet Take 1 tablet (7.5 mg total) by mouth daily. 30 tablet 5   mirtazapine (REMERON) 15 MG tablet TAKE 1 TABLET BY MOUTH EVERYDAY AT BEDTIME 90 tablet 1   terbinafine (LAMISIL) 250 MG tablet Take 250 mg by mouth daily.     tiZANidine (ZANAFLEX) 4 MG tablet Take 4 mg by mouth every 6 (six) hours as needed for muscle spasms.     valACYclovir (VALTREX) 1000 MG tablet Take 1 tablet (1,000 mg total) by mouth 3 (three) times daily. 21 tablet 0   No current facility-administered medications for this visit.    Allergies as of 01/05/2022   (No Known Allergies)    Past Medical History:  Diagnosis Date   Panic attack     Past Surgical History:  Procedure Laterality Date   BICEPT TENODESIS Right 07/23/2020   Procedure: RIGHT SHOULDER OPEN BICEPS TENODESIS;  Surgeon: Carole Civil, MD;  Location: [facility];  Service: Orthopedics;  Laterality: Right;   COLONOSCOPY N/A 10/04/2015   Procedure: COLONOSCOPY;  Surgeon: Daneil Dolin, MD;  Location: [facility];  Service: Endoscopy;  Laterality: N/A;  7:30 AM   KNEE ARTHROSCOPY W/ ACL RECONSTRUCTION Right    RIGHT/LEFT HEART CATH AND CORONARY  ANGIOGRAPHY N/A 02/23/2019   Procedure: RIGHT/LEFT HEART CATH AND CORONARY ANGIOGRAPHY;  Surgeon: Belva Crome, MD;  Location: [facility];  Service: Cardiovascular;  Laterality: N/A;   rotator cuff surgery Right    SHOULDER ARTHROSCOPY WITH OPEN ROTATOR CUFF REPAIR Right 08/31/2019   Procedure: RIGHT SHOULDER ARTHROSCOPY with open rotator cuff repair;  Surgeon: Carole Civil, MD;  Location: [facility];  Service: Orthopedics;  Laterality: Right;   SHOULDER CLOSED REDUCTION Right 04/16/2020   Procedure: CLOSED MANIPULATION RIGHT SHOULDER;  Surgeon: Carole Civil, MD;  Location: [facility];  Service: Orthopedics;  Laterality: Right;    Family History  Problem Relation Age of Onset   Cancer Mother     Social History   Socioeconomic History   Marital status: Married    Spouse name: Not on file   Number of children: Not on file   Years of education: Not on file   Highest education level: Not on file  Occupational History   Not on file  Tobacco Use   Smoking status: Never   Smokeless tobacco: Never  Vaping Use   Vaping Use: Never used  Substance and Sexual Activity   Alcohol use: No   Drug use: No   Sexual activity: Yes  Other Topics Concern   Not on file  Social History Narrative   Not on  file   Social Determinants of Health   Financial Resource Strain: Not on file  Food Insecurity: Not on file  Transportation Needs: Not on file  Physical Activity: Not on file  Stress: Not on file  Social Connections: Not on file  Intimate Partner Violence: Not on file      ROS:  General: Negative for anorexia, weight loss, fever, chills, fatigue, weakness. Eyes: Negative for vision changes.  ENT: Negative for hoarseness, difficulty swallowing , nasal congestion. CV: Negative for chest pain, angina, palpitations, dyspnea on exertion, peripheral edema.  Respiratory: Negative for dyspnea at rest, dyspnea on exertion, cough, sputum, wheezing.  GI: See history of present  illness. GU:  Negative for dysuria, hematuria, urinary incontinence, urinary frequency, nocturnal urination.  MS: Negative for joint pain, low back pain.  Derm: Negative for rash or itching.  Neuro: Negative for weakness, abnormal sensation, seizure, frequent headaches, memory loss, confusion.  Psych: Negative for anxiety, depression, suicidal ideation, hallucinations.  Endo: Negative for unusual weight change.  Heme: Negative for bruising or bleeding. Allergy: Negative for rash or hives.    Physical Examination:  There were no vitals taken for this visit.   General: Well-nourished, well-developed in no acute distress.  Head: Normocephalic, atraumatic.   Eyes: Conjunctiva pink, no icterus. Mouth: Oropharyngeal mucosa moist and pink , no lesions erythema or exudate. Neck: Supple without thyromegaly, masses, or lymphadenopathy.  Lungs: Clear to auscultation bilaterally.  Heart: Regular rate and rhythm, no murmurs rubs or gallops.  Abdomen: Bowel sounds are normal, nontender, nondistended, no hepatosplenomegaly or masses, no abdominal bruits or    hernia , no rebound or guarding.   Rectal: not performed Extremities: No lower extremity edema. No clubbing or deformities.  Neuro: Alert and oriented x 4 , grossly normal neurologically.  Skin: Warm and dry, no rash or jaundice.   Psych: Alert and cooperative, normal mood and affect.  Labs: Lab Results  Component Value Date   CREATININE 0.73 (L) 10/15/2021   BUN 13 10/15/2021   NA 142 10/15/2021   K 4.8 10/15/2021   CL 103 10/15/2021   CO2 25 10/15/2021   Lab Results  Component Value Date   ALT 18 10/15/2021   AST 11 10/15/2021   ALKPHOS 106 10/15/2021   BILITOT 0.6 10/15/2021   Lab Results  Component Value Date   WBC 7.3 07/18/2020   HGB 16.0 07/18/2020   HCT 50.4 07/18/2020   MCV 86.2 07/18/2020   PLT 273 07/18/2020     Imaging Studies: No results found.   Assessment:       Plan:

## 2022-01-06 NOTE — Telephone Encounter (Signed)
Nothing to add

## 2022-02-10 ENCOUNTER — Telehealth: Payer: Self-pay | Admitting: Orthopedic Surgery

## 2022-02-10 NOTE — Telephone Encounter (Signed)
Patient left message/call returned - he said he still had been using his last pain medication  ?oxyCODONE-acetaminophen (PERCOCET/ROXICET) 5-325 MG tablet 21 tablet  ?    [facility] (same pharmacy)   ? - patient last was seen 08/21/21. Please advsie* ?  *patient is aware Dr Aline Brochure will be back in clinic Monday, 02/16/22. ?

## 2022-02-18 NOTE — Telephone Encounter (Signed)
Left a voice message for patient to call back to schedule appointment as noted. ?

## 2022-02-24 ENCOUNTER — Ambulatory Visit: Payer: 59 | Admitting: Family Medicine

## 2022-02-25 ENCOUNTER — Other Ambulatory Visit: Payer: Self-pay

## 2022-02-25 ENCOUNTER — Ambulatory Visit (INDEPENDENT_AMBULATORY_CARE_PROVIDER_SITE_OTHER): Payer: 59 | Admitting: Orthopedic Surgery

## 2022-02-25 DIAGNOSIS — Z9889 Other specified postprocedural states: Secondary | ICD-10-CM

## 2022-02-25 DIAGNOSIS — G8929 Other chronic pain: Secondary | ICD-10-CM

## 2022-02-25 DIAGNOSIS — M25561 Pain in right knee: Secondary | ICD-10-CM

## 2022-02-25 DIAGNOSIS — G894 Chronic pain syndrome: Secondary | ICD-10-CM | POA: Diagnosis not present

## 2022-02-25 MED ORDER — OXYCODONE-ACETAMINOPHEN 5-325 MG PO TABS: 1.0000 | ORAL_TABLET | ORAL | 0 refills | Status: DC | PRN

## 2022-02-25 NOTE — Progress Notes (Signed)
FOLLOW UP  ? ?Encounter Diagnoses  ?Name Primary?  ? S/P shoulder surgery   ? S/P arthroscopy of right shoulder   ? History of failed repair of rotator cuff   ? Chronic pain syndrome Yes  ? Chronic pain of right knee   ? ? ? ?Chief Complaint  ?Patient presents with  ? Shoulder Pain  ?  RT shoulder ?F/u  ?  ? ? ? ?Mario Meyer has a failed rotator cuff he has chronic pain of his right shoulder he takes some oxycodone here and there when is hurting him at night.  He has used his pain medication wisely and appropriately ? ?His medication was refilled ? ?Follow-up in August or September for chronic disability physical ? ?Meds ordered this encounter  ?Medications  ? oxyCODONE-acetaminophen (PERCOCET/ROXICET) 5-325 MG tablet  ?  Sig: Take 1 tablet by mouth every 4 (four) hours as needed for severe pain.  ?  Dispense:  30 tablet  ?  Refill:  0  ? ? ?

## 2022-02-26 ENCOUNTER — Ambulatory Visit: Payer: 59

## 2022-03-31 ENCOUNTER — Ambulatory Visit: Payer: 59

## 2022-04-07 ENCOUNTER — Ambulatory Visit: Payer: 59

## 2022-04-24 ENCOUNTER — Ambulatory Visit: Payer: 59 | Admitting: Gastroenterology

## 2022-05-07 ENCOUNTER — Ambulatory Visit: Payer: 59 | Admitting: Orthopedic Surgery

## 2022-05-11 ENCOUNTER — Ambulatory Visit: Payer: 59

## 2022-06-16 ENCOUNTER — Telehealth: Payer: Self-pay | Admitting: Family Medicine

## 2022-06-16 DIAGNOSIS — Z79899 Other long term (current) drug therapy: Secondary | ICD-10-CM

## 2022-06-16 DIAGNOSIS — Z125 Encounter for screening for malignant neoplasm of prostate: Secondary | ICD-10-CM

## 2022-06-16 DIAGNOSIS — E782 Mixed hyperlipidemia: Secondary | ICD-10-CM

## 2022-06-16 DIAGNOSIS — Z0000 Encounter for general adult medical examination without abnormal findings: Secondary | ICD-10-CM

## 2022-06-16 NOTE — Telephone Encounter (Signed)
Last labs 10/15/21:  Hep C Antibody, HIV, BMP, Lipid, Liver, PSA

## 2022-06-16 NOTE — Telephone Encounter (Signed)
Wellness Lipid, liver, metabolic 7, PSA

## 2022-06-16 NOTE — Telephone Encounter (Signed)
Patient is needing labs for physical on 8/24

## 2022-06-17 NOTE — Telephone Encounter (Signed)
Labs ordered in epic. Pt informed

## 2022-06-20 ENCOUNTER — Other Ambulatory Visit: Payer: Self-pay | Admitting: Family Medicine

## 2022-06-22 NOTE — Telephone Encounter (Signed)
Please verify with patient he is still taking this medicine if so he may have 90-day refill to cover him until he follows up in August thank you

## 2022-06-23 ENCOUNTER — Telehealth: Payer: Self-pay | Admitting: Family Medicine

## 2022-06-23 ENCOUNTER — Other Ambulatory Visit: Payer: Self-pay | Admitting: Family Medicine

## 2022-06-23 MED ORDER — ALPRAZOLAM 0.5 MG PO TABS: 0.5000 mg | ORAL_TABLET | Freq: Every evening | ORAL | 0 refills | Status: DC | PRN

## 2022-06-23 NOTE — Telephone Encounter (Signed)
Patient says he would like to go back to the alprazolam because this medication makes him really sleeping. Please advise. Thank you

## 2022-06-23 NOTE — Telephone Encounter (Signed)
Nurses Through the prescription request portal patient stated he no longer wanted to use mirtazapine.  He requested Xanax at nighttime.  This prescription was sent then 0.5 mg 1 as needed at nighttime to help with rest.  It is not recommended to use this every single night just only when necessary.  Patient has a follow-up visit later in August he needs to keep this visit thank you

## 2022-06-24 NOTE — Telephone Encounter (Signed)
Patient notified and verbalized understanding. 

## 2022-07-22 ENCOUNTER — Ambulatory Visit (INDEPENDENT_AMBULATORY_CARE_PROVIDER_SITE_OTHER): Payer: Medicare Other | Admitting: Orthopedic Surgery

## 2022-07-22 ENCOUNTER — Encounter: Payer: Self-pay | Admitting: Orthopedic Surgery

## 2022-07-22 DIAGNOSIS — Z9889 Other specified postprocedural states: Secondary | ICD-10-CM

## 2022-07-22 DIAGNOSIS — G894 Chronic pain syndrome: Secondary | ICD-10-CM

## 2022-07-22 MED ORDER — OXYCODONE-ACETAMINOPHEN 5-325 MG PO TABS: 1.0000 | ORAL_TABLET | Freq: Three times a day (TID) | ORAL | 0 refills | Status: AC | PRN

## 2022-07-22 NOTE — Progress Notes (Signed)
Chief Complaint  Patient presents with   Shoulder Pain    Right still with decreased ROM and pain    Encounter Diagnoses  Name Primary?   History of failed repair of rotator cuff Yes   Chronic pain syndrome     Mario Meyer comes in for his yearly follow-up he had multiple surgeries on his right shoulder and could not regain range of motion still has chronic pain  Today his range of motion is 30 degrees of abduction with significant scapular shrugging  His flexion is only about 85 degrees  He rates his pain as 10 out of 10 at its worst  He takes oxycodone occasionally for that  I filled out his papers I will see him in a year

## 2022-07-23 LAB — BASIC METABOLIC PANEL
BUN/Creatinine Ratio: 17 (ref 10–24)
BUN: 15 mg/dL (ref 8–27)
CO2: 22 mmol/L (ref 20–29)
Calcium: 9.5 mg/dL (ref 8.6–10.2)
Chloride: 105 mmol/L (ref 96–106)
Creatinine, Ser: 0.87 mg/dL (ref 0.76–1.27)
Glucose: 94 mg/dL (ref 70–99)
Potassium: 4.8 mmol/L (ref 3.5–5.2)
Sodium: 141 mmol/L (ref 134–144)
eGFR: 99 mL/min/{1.73_m2} (ref >59)

## 2022-07-23 LAB — HEPATIC FUNCTION PANEL
ALT: 16 IU/L (ref 0–44)
AST: 15 IU/L (ref 0–40)
Albumin: 4.5 g/dL (ref 3.8–4.9)
Alkaline Phosphatase: 84 IU/L (ref 44–121)
Bilirubin Total: 0.5 mg/dL (ref 0.0–1.2)
Bilirubin, Direct: 0.15 mg/dL (ref 0.00–0.40)
Total Protein: 7.1 g/dL (ref 6.0–8.5)

## 2022-07-23 LAB — LIPID PANEL
Chol/HDL Ratio: 3.7 ratio (ref 0.0–5.0)
Cholesterol, Total: 220 mg/dL — ABNORMAL HIGH (ref 100–199)
HDL: 60 mg/dL (ref >39)
LDL Chol Calc (NIH): 133 mg/dL — ABNORMAL HIGH (ref 0–99)
Triglycerides: 152 mg/dL — ABNORMAL HIGH (ref 0–149)
VLDL Cholesterol Cal: 27 mg/dL (ref 5–40)

## 2022-07-23 LAB — PSA: Prostate Specific Ag, Serum: 2.9 ng/mL (ref 0.0–4.0)

## 2022-07-30 ENCOUNTER — Ambulatory Visit (INDEPENDENT_AMBULATORY_CARE_PROVIDER_SITE_OTHER): Payer: 59 | Admitting: Family Medicine

## 2022-07-30 ENCOUNTER — Encounter: Payer: Self-pay | Admitting: Family Medicine

## 2022-07-30 ENCOUNTER — Telehealth: Payer: Self-pay | Admitting: Family Medicine

## 2022-07-30 VITALS — BP 118/78 | HR 55 | Temp 98.2°F | Wt 221.0 lb

## 2022-07-30 DIAGNOSIS — Z1211 Encounter for screening for malignant neoplasm of colon: Secondary | ICD-10-CM

## 2022-07-30 DIAGNOSIS — Z0000 Encounter for general adult medical examination without abnormal findings: Secondary | ICD-10-CM | POA: Diagnosis not present

## 2022-07-30 DIAGNOSIS — N401 Enlarged prostate with lower urinary tract symptoms: Secondary | ICD-10-CM | POA: Diagnosis not present

## 2022-07-30 DIAGNOSIS — R350 Frequency of micturition: Secondary | ICD-10-CM

## 2022-07-30 LAB — POCT URINALYSIS DIP (CLINITEK)
Bilirubin, UA: NEGATIVE
Blood, UA: NEGATIVE
Glucose, UA: NEGATIVE mg/dL
Ketones, POC UA: NEGATIVE mg/dL
Leukocytes, UA: NEGATIVE
Nitrite, UA: NEGATIVE
POC PROTEIN,UA: NEGATIVE
Spec Grav, UA: 1.02 (ref 1.010–1.025)
Urobilinogen, UA: NEGATIVE E.U./dL — AB
pH, UA: 6 (ref 5.0–8.0)

## 2022-07-30 MED ORDER — TAMSULOSIN HCL 0.4 MG PO CAPS: 0.4000 mg | ORAL_CAPSULE | Freq: Every day | ORAL | 3 refills | Status: DC

## 2022-07-30 MED ORDER — SILDENAFIL CITRATE 20 MG PO TABS: ORAL_TABLET | ORAL | 0 refills | Status: DC

## 2022-07-30 MED ORDER — DICLOFENAC SODIUM 75 MG PO TBEC: DELAYED_RELEASE_TABLET | ORAL | 1 refills | Status: DC

## 2022-07-30 NOTE — Telephone Encounter (Signed)
Left message to return call 

## 2022-07-30 NOTE — Telephone Encounter (Signed)
Patient ws seen this morning and had some more questions about his in large prostate you spoke of  in his visit.

## 2022-07-30 NOTE — Telephone Encounter (Signed)
Pt returned call. Pt states he said Dr.Scott said he had an enlarged prostate and he is wanting to make he doesn't have anything to worry about. Please advise. Thank you

## 2022-07-30 NOTE — Telephone Encounter (Signed)
So when I saw the patient his prostate was enlarged but was soft no hard nodules.  And his PSA looks good.  He was having difficulty urinating which is very common with enlarged prostate.  He was also getting up 3 times at night which is also very common with prostate.  Because of all of this I recommended Flomax in the evening most people tolerate it well but if it causes dizziness to stop it.  It should help his urine flow be much better.  I find no sign of cancer going on and I told the patient we would do a follow-up in 3 months.  I tried my best to reassure him that this is a occurrence for some men as they get older and not currently showing any signs of cancer.  If he would like for [place] to mail him some additional information on enlarged prostate I can print off some information and send it to him through the mail If he would like to do a follow-up visit sooner to discuss this in further detail I would be more than happy to see him anytime in the next few weeks.  If he has additional questions let me know

## 2022-07-30 NOTE — Progress Notes (Addendum)
   Subjective:    Patient ID: Mario Meyer, male    DOB: [birth year], 61 y.o.   MRN: [id number]  HPI The patient comes in today for a wellness visit.   A review of their health history was completed.  A review of medications was also completed.  Any needed refills; no  Eating habits: good  Falls/  MVA accidents in past few months: no  Regular exercise: works on [employer] pt sees on regular basis: [employer] issues were discussed.   Additional concerns: low back pain , frequent night urination w/burning at times, left shoulder pain   Patient relates having to urinate 3 times at night he states during the day his flow is slow he wonders if this is a sign of any problems but denies any significant dysuria fever chills abdominal pain or flank pain  Patient does relate for the past 2 weeks some intermittent lower back pain nonradiating no fever chills or sweats with this worse with certain movements. Review of Systems     Objective:   Physical Exam General-in no acute distress Eyes-no discharge Lungs-respiratory rate normal, CTA CV-no murmurs,RRR Extremities skin warm dry no edema Neuro grossly normal Behavior normal, alert Back exam benign. Moderately enlarged prostate very soft no hard nodules  His ASCVD risk is 7% healthy diet regular activity recommended    Assessment & Plan:  1. Well adult exam Adult wellness-complete.wellness physical was conducted today. Importance of diet and exercise were discussed in detail.  Importance of stress reduction and healthy living were discussed.  In addition to this a discussion regarding safety was also covered.  We also reviewed over immunizations and gave recommendations regarding current immunization needed for age.   In addition to this additional areas were also touched on including: Preventative health exams needed:  Colonoscopy he is due for his colonoscopy we will send referral  Patient was  advised yearly wellness exam  - Ambulatory referral to Gastroenterology  2. Frequent urination No sign of UTI going on - POCT URINALYSIS DIP (CLINITEK)  3. Benign prostatic hyperplasia with urinary frequency Most recent PSA looks good which is reassuring.  It is in the patient's best interest to do the PSA on a yearly basis.  I did discuss with him about enlarged prostate is a common problem for men as they get older will cause reduction in urine flow and increased frequency we did discuss medications and he would like to try Flomax 0.4 mg each evening to help his urine flow if he has ongoing troubles or problems he will let us know then he will follow-up in several months  Patient is interested in repeating the PSA in 6 months this would be reasonable 4. Screen for colon cancer Screening colonoscopy recommended - Ambulatory referral to Gastroenterology

## 2022-07-31 ENCOUNTER — Telehealth: Payer: Self-pay | Admitting: Family Medicine

## 2022-07-31 NOTE — Telephone Encounter (Signed)
Patient called again with more questions about physical he had on yesterday. Will he need an office visit or phone visit to discuss. Please advise

## 2022-07-31 NOTE — Telephone Encounter (Signed)
See previous message 07/31/22- patient stated that message addressed his questions and concerns for provider

## 2022-07-31 NOTE — Telephone Encounter (Signed)
Patient advised per Dr Nicki Reaper : So when I saw the patient his prostate was enlarged but was soft no hard nodules.  And his PSA looks good.  He was having difficulty urinating which is very common with enlarged prostate.  He was also getting up 3 times at night which is also very common with prostate.  Because of all of this I recommended Flomax in the evening most people tolerate it well but if it causes dizziness to stop it.  It should help his urine flow be much better.  I find no sign of cancer going on and I told the patient we would do a follow-up in 3 months.  I tried my best to reassure him that this is a occurrence for some men as they get older and not currently showing any signs of cancer.   If he would like for [place] to mail him some additional information on enlarged prostate I can print off some information and send it to him through the mail If he would like to do a follow-up visit sooner to discuss this in further detail I would be more than happy to see him anytime in the next few weeks  Patient verbalized understanding and stated he will try the medication and see how it goes but his main concern in it all was cancer.

## 2022-09-07 ENCOUNTER — Ambulatory Visit (INDEPENDENT_AMBULATORY_CARE_PROVIDER_SITE_OTHER): Payer: Medicare Other | Admitting: Family Medicine

## 2022-09-07 ENCOUNTER — Ambulatory Visit (HOSPITAL_COMMUNITY)
Admission: RE | Admit: 2022-09-07 | Discharge: 2022-09-07 | Disposition: A | Discharge status: Home / Self Care | Payer: Medicare Other | Source: Ambulatory Visit | Attending: Family Medicine | Admitting: Family Medicine

## 2022-09-07 VITALS — BP 124/77 | Temp 98.8°F | Ht 73.0 in | Wt 223.0 lb

## 2022-09-07 DIAGNOSIS — N401 Enlarged prostate with lower urinary tract symptoms: Secondary | ICD-10-CM

## 2022-09-07 DIAGNOSIS — Z1211 Encounter for screening for malignant neoplasm of colon: Secondary | ICD-10-CM

## 2022-09-07 DIAGNOSIS — M545 Low back pain, unspecified: Secondary | ICD-10-CM

## 2022-09-07 DIAGNOSIS — R350 Frequency of micturition: Secondary | ICD-10-CM | POA: Diagnosis not present

## 2022-09-07 NOTE — Progress Notes (Signed)
   Subjective:    Patient ID: Mario Meyer, male    DOB: 09-10-1961, 61 y.o.   MRN: [id number]  HPI Lumbar pain that feels like its tied in together to right buttock pian worsened in last month - really bad getting up in the morning, pain all day  Significant pain in the lower back radiates into the right hip hurts with certain movements and twisting.  Does not radiate down the leg currently but does radiate into the buttock upper thigh region.  Relates the pain very severe he also relates at night when he sleeps and rolls over he feels pain in his back and when he sits up he feels pain he denies any injury.  This has been going on since mid August. He does have BPH although this has not been painful and he denies any prostatitis symptoms  Review of Systems     Objective:   Physical Exam  Lungs clear heart regular abdomen soft low back subjective discomforts straight leg raise negative both sides when he crosses his leg on the right side he does get pain in the lower back      Assessment & Plan:   1. Lumbar pain Stretching exercises recommended, anti-inflammatory as needed, x-rays indicated because of some symptoms at nighttime, if his symptoms or not improved by the time he follows up in November consider MRI. - DG Lumbar Spine 2-3 Views  2. Benign prostatic hyperplasia with urinary frequency Previous PSAs have been within normal but elevated he also has a large prostate with multiple spells of getting up at night to urinate but his flow is good with Flomax.  Consultation with urology.  Patient concerned about the possibility of developing cancer. - Ambulatory referral to Urology  3. Screen for colon cancer Screening colonoscopy recommended - Ambulatory referral to Gastroenterology

## 2022-09-08 ENCOUNTER — Ambulatory Visit: Payer: 59 | Admitting: Gastroenterology

## 2022-09-09 ENCOUNTER — Telehealth: Payer: Self-pay | Admitting: Family Medicine

## 2022-09-09 NOTE — Telephone Encounter (Signed)
1.  Please let patient know that I can see that he did the x-rays but they have not been read by radiologist yet-hopefully radiologist to read this in the next 24 to 48 hours-they have been running behind 2.  Please touch base with radiology asking them to follow through on having x-rays read

## 2022-09-09 NOTE — Telephone Encounter (Signed)
Radiologist has read results and placed them in [place]. Please advise. Thank you

## 2022-09-09 NOTE — Telephone Encounter (Signed)
Degenerative low back disease noted.  No sign of any major issues.  The exercises that I showed him should help.  If you would like to do physical therapy that can be done.  If he is not doing better by the time he follows up we can discuss the next steps at that time.  In some situations we will have to set up with a specialist or to set up an MRI.  But currently right now MRI not indicated.  Please see result note for full details.

## 2022-09-09 NOTE — Telephone Encounter (Signed)
Pt calling to check on xray results. Pt had back xrays completed on Monday. Please advise. Thank you  [phone]

## 2022-09-10 ENCOUNTER — Other Ambulatory Visit: Payer: Self-pay

## 2022-09-10 ENCOUNTER — Encounter: Payer: Self-pay | Admitting: Family Medicine

## 2022-09-10 ENCOUNTER — Telehealth: Payer: Self-pay | Admitting: *Deleted

## 2022-09-10 DIAGNOSIS — R109 Unspecified abdominal pain: Secondary | ICD-10-CM

## 2022-09-10 DIAGNOSIS — M545 Low back pain, unspecified: Secondary | ICD-10-CM

## 2022-09-10 DIAGNOSIS — R110 Nausea: Secondary | ICD-10-CM

## 2022-09-10 MED ORDER — GABAPENTIN 100 MG PO CAPS: ORAL_CAPSULE | ORAL | 1 refills | Status: DC

## 2022-09-10 MED ORDER — ONDANSETRON HCL 8 MG PO TABS: 8.0000 mg | ORAL_TABLET | Freq: Three times a day (TID) | ORAL | 1 refills | Status: DC | PRN

## 2022-09-10 NOTE — Progress Notes (Signed)
Prescription was sent

## 2022-09-10 NOTE — Telephone Encounter (Signed)
Patient has been informed per drs notes and recommendations, pt states will wait on PT for now .

## 2022-09-10 NOTE — Telephone Encounter (Signed)
Sent message to patient on MyChart. Pt has multiple phone calls and messages regarding this. Please see additional messages.

## 2022-09-10 NOTE — Telephone Encounter (Signed)
Nurses-I did a very detailed message phone message please implement what I stated forth in that phone message and talk with Jihaad.  I would like for Mario Meyer to give me an update next week how things are going.  And definitely lets move his follow-up appointment up to later this month rather than the end of November Please see telephone message for full details thank

## 2022-09-10 NOTE — Telephone Encounter (Signed)
Leda Min, LPN      50/0/37  0:48 PM Note Patient has been informed per drs notes and recommendations, pt states will wait on PT for now .

## 2022-09-10 NOTE — Telephone Encounter (Signed)
1.  Given his symptoms he may use Zofran for nausea 8 mg, 1 taken every 8 hours as needed for nausea, #15, 1 refill  #2 for the sciatica he can add gabapentin low-dose 100 mg start off 1 in the evening for the first 3 to 4 days then 1 twice daily most people tolerate this it can help with the sciatica we start off with low-dose and gradually move up.  Also small number of people can have drowsiness or dizziness with this medicine but typically if we start it low it is well-tolerated.  After 4 days he can start taking 1 twice daily.  This medicine can tone down the pain from the nerves but it is not a controlled medicine.  Not addictive. If he is in agreements to trying this medicine then you can send in gabapentin 100 mg 1 twice daily but following the above directions, #60, 1 refill  #3 finally it would be wise to move up his appointment instead of the end of November I would recommend late October or early November in approximately 3 weeks  No defy [place] sooner if any problems  Physical therapy can help if he would like a physical therapy referral go ahead with that as well-evaluate and treat

## 2022-09-10 NOTE — Telephone Encounter (Signed)
Patient called and stated he is having a lot of pain with his sciatic nerve and also having nausea and wonders if the pain is what is making him feel nauseated and sick

## 2022-09-18 ENCOUNTER — Ambulatory Visit (INDEPENDENT_AMBULATORY_CARE_PROVIDER_SITE_OTHER): Payer: Medicare Other | Admitting: Urology

## 2022-09-18 ENCOUNTER — Encounter: Payer: Self-pay | Admitting: Family Medicine

## 2022-09-18 ENCOUNTER — Encounter: Payer: Self-pay | Admitting: Urology

## 2022-09-18 VITALS — BP 138/80 | HR 70

## 2022-09-18 DIAGNOSIS — N401 Enlarged prostate with lower urinary tract symptoms: Secondary | ICD-10-CM | POA: Diagnosis not present

## 2022-09-18 DIAGNOSIS — N138 Other obstructive and reflux uropathy: Secondary | ICD-10-CM | POA: Diagnosis not present

## 2022-09-18 LAB — POCT URINALYSIS DIPSTICK
Bilirubin, UA: NEGATIVE
Glucose, UA: NEGATIVE
Ketones, UA: NEGATIVE
Leukocytes, UA: NEGATIVE
Nitrite, UA: NEGATIVE
Protein, UA: NEGATIVE
Spec Grav, UA: 1.02 (ref 1.010–1.025)
Urobilinogen, UA: 0.2 E.U./dL
pH, UA: 5 (ref 5.0–8.0)

## 2022-09-18 LAB — BLADDER SCAN AMB NON-IMAGING: Scan Result: 115

## 2022-09-18 NOTE — Telephone Encounter (Signed)
Certainly can try MiraLAX 1 capful in 8 ounces  If the patient is certain that his abdominal discomfort is coming from constipation this is a reasonable approach.  Also may need to do Senokot 1/day until relief  But to be clear it is difficult to know for certain what is causing his abdominal pain through an electronic message.  In words we cannot know for certain via message what is causing his abdominal pain.  We are more than willing to see him this afternoon for further evaluation if he chooses or he may need to go to urgent care or ER if abdominal discomfort persists.

## 2022-09-18 NOTE — Progress Notes (Signed)
Assessment: 1. BPH with obstruction/lower urinary tract symptoms     Plan: I personally reviewed the patient's records from [facility] as well as lab results. His PSA has been within normal range and is likely consistent with BPH. Management of BPH with medical therapy and surgical therapy discussed.  His primary symptom is nocturia which is not bothersome for him.  He has not seen any change in his symptoms with tamsulosin.  I discussed possible causes of nocturia.  At this point he would like to discontinue the tamsulosin which I think is reasonable. Recommend continued screening with DRE and PSA annually. Return to office prn  Chief Complaint:  Chief Complaint  Patient presents with   Benign Prostatic Hypertrophy    History of Present Illness:  Mario Meyer is a 61 y.o. male who is seen in consultation from Kathyrn Drown, MD for evaluation of BPH.   He has a history of BPH with lower urinary tract symptoms.  His primary symptom is nocturia 1-2 times.  He does have occasional postvoid dribbling.  No dysuria or gross hematuria.  No difficulty starting his stream.  He has been managed with tamsulosin for about 1 month.  He has not seen any change in his nocturia while on the medication.  No history of UTIs.  PSA results: 10/18 3.7 10/21 2.6 11/22 3.6 8/23 2.9   Past Medical History:  Past Medical History:  Diagnosis Date   Panic attack     Past Surgical History:  Past Surgical History:  Procedure Laterality Date   BICEPT TENODESIS Right 07/23/2020   Procedure: RIGHT SHOULDER OPEN BICEPS TENODESIS;  Surgeon: Carole Civil, MD;  Location: [facility];  Service: Orthopedics;  Laterality: Right;   COLONOSCOPY N/A 10/04/2015   Procedure: COLONOSCOPY;  Surgeon: Daneil Dolin, MD;  Location: [facility];  Service: Endoscopy;  Laterality: N/A;  7:30 AM   KNEE ARTHROSCOPY W/ ACL RECONSTRUCTION Right    RIGHT/LEFT HEART CATH AND CORONARY ANGIOGRAPHY N/A 02/23/2019    Procedure: RIGHT/LEFT HEART CATH AND CORONARY ANGIOGRAPHY;  Surgeon: Belva Crome, MD;  Location: [facility];  Service: Cardiovascular;  Laterality: N/A;   rotator cuff surgery Right    SHOULDER ARTHROSCOPY WITH OPEN ROTATOR CUFF REPAIR Right 08/31/2019   Procedure: RIGHT SHOULDER ARTHROSCOPY with open rotator cuff repair;  Surgeon: Carole Civil, MD;  Location: [facility];  Service: Orthopedics;  Laterality: Right;   SHOULDER CLOSED REDUCTION Right 04/16/2020   Procedure: CLOSED MANIPULATION RIGHT SHOULDER;  Surgeon: Carole Civil, MD;  Location: [facility];  Service: Orthopedics;  Laterality: Right;    Allergies:  No Known Allergies  Family History:  Family History  Problem Relation Age of Onset   Cancer Mother     Social History:  Social History   Tobacco Use   Smoking status: Never   Smokeless tobacco: Never  Vaping Use   Vaping Use: Never used  Substance Use Topics   Alcohol use: No   Drug use: No    Review of symptoms:  Constitutional:  Negative for unexplained weight loss, night sweats, fever, chills ENT:  Negative for nose bleeds, sinus pain, painful swallowing CV:  Negative for chest pain, shortness of breath, exercise intolerance, palpitations, loss of consciousness Resp:  Negative for cough, wheezing, shortness of breath GI:  Negative for nausea, vomiting, diarrhea, bloody stools GU:  Positives noted in HPI; otherwise negative for gross hematuria, dysuria Neuro:  Negative for seizures, poor balance, Meyer weakness, slurred  speech Psych:  Negative for lack of energy Endocrine:  Negative for polydipsia, polyuria, symptoms of hypoglycemia (dizziness, hunger, sweating) Hematologic:  Negative for anemia, purpura, petechia, prolonged or excessive bleeding, use of anticoagulants  Allergic:  Negative for difficulty breathing or choking as a result of exposure to anything; no shellfish allergy; no allergic response (rash/itch) to materials, foods  Physical  exam: BP 138/80   Pulse 70  GENERAL APPEARANCE:  Well appearing, well developed, well nourished, NAD HEENT: Atraumatic, Normocephalic, oropharynx clear. NECK: Supple without lymphadenopathy or thyromegaly. LUNGS: Clear to auscultation bilaterally. HEART: Regular Rate and Rhythm without murmurs, gallops, or rubs. ABDOMEN: Soft, non-tender, No Masses. EXTREMITIES: Moves all extremities well.  Without clubbing, cyanosis, or edema. NEUROLOGIC:  Alert and oriented x 3, normal gait, CN II-XII grossly intact.  MENTAL STATUS:  Appropriate. BACK:  Non-tender to palpation.  No CVAT SKIN:  Warm, dry and intact.   GU: Penis:  uncircumcised Meatus: Normal Scrotum: normal, no masses Testis: normal without masses bilateral Prostate: 60 gm, NT, no nodules Rectum: Normal tone,  no masses or tenderness   Results: U/A: Dipstick negative  PVR:  115 ml

## 2022-09-23 ENCOUNTER — Encounter: Payer: Self-pay | Admitting: *Deleted

## 2022-10-14 DIAGNOSIS — G4700 Insomnia, unspecified: Secondary | ICD-10-CM | POA: Insufficient documentation

## 2022-10-14 DIAGNOSIS — G894 Chronic pain syndrome: Secondary | ICD-10-CM | POA: Insufficient documentation

## 2022-10-14 DIAGNOSIS — F419 Anxiety disorder, unspecified: Secondary | ICD-10-CM | POA: Insufficient documentation

## 2022-10-14 DIAGNOSIS — M25519 Pain in unspecified shoulder: Secondary | ICD-10-CM | POA: Insufficient documentation

## 2022-10-15 ENCOUNTER — Encounter: Payer: Self-pay | Admitting: Orthopedic Surgery

## 2022-10-15 ENCOUNTER — Ambulatory Visit (INDEPENDENT_AMBULATORY_CARE_PROVIDER_SITE_OTHER): Payer: Medicare Other

## 2022-10-15 ENCOUNTER — Ambulatory Visit (INDEPENDENT_AMBULATORY_CARE_PROVIDER_SITE_OTHER): Payer: Medicare Other | Admitting: Orthopedic Surgery

## 2022-10-15 VITALS — BP 122/74 | HR 65 | Ht 73.0 in | Wt 227.0 lb

## 2022-10-15 DIAGNOSIS — M7552 Bursitis of left shoulder: Secondary | ICD-10-CM | POA: Diagnosis not present

## 2022-10-15 DIAGNOSIS — M25512 Pain in left shoulder: Secondary | ICD-10-CM | POA: Diagnosis not present

## 2022-10-15 MED ORDER — METHYLPREDNISOLONE ACETATE 40 MG/ML IJ SUSP
40.0000 mg | Freq: Once | INTRAMUSCULAR | Status: AC
  Administered 2022-10-15: 40 mg via INTRA_ARTICULAR

## 2022-10-15 NOTE — Patient Instructions (Signed)

## 2022-10-15 NOTE — Progress Notes (Signed)
Chief Complaint  Patient presents with   Shoulder Pain    Lt shoulder pain for 2 mos.   History 61 year old male no trauma left shoulder pain some loss of motion with internal rotation and pain at night  Review of systems negative  Past Medical History:  Diagnosis Date   Panic attack     BP 122/74   Pulse 65   Ht '6\' 1"'$  (1.854 m)   Wt 227 lb (103 kg)   BMI 29.95 kg/m    Left shoulder: No tenderness but pain with forward elevation at 120 degrees strength is normal in abduction and flexion decreased internal rotation  X-ray was negative  Encounter Diagnoses  Name Primary?   Acute pain of left shoulder Yes   Bursitis of left shoulder     Procedure note the subacromial injection shoulder left   Verbal consent was obtained to inject the  Left   Shoulder  Timeout was completed to confirm the injection site is a subacromial space of the  left  shoulder  Medication used Depo-Medrol 40 mg and lidocaine 1% 3 cc  Anesthesia was provided by ethyl chloride  The injection was performed in the left  posterior subacromial space. After pinning the skin with alcohol and anesthetized the skin with ethyl chloride the subacromial space was injected using a 20-gauge needle. There were no complications  Sterile dressing was applied.

## 2022-10-25 ENCOUNTER — Other Ambulatory Visit: Payer: Self-pay | Admitting: Family Medicine

## 2022-11-03 ENCOUNTER — Ambulatory Visit: Payer: 59 | Admitting: Family Medicine

## 2022-12-09 ENCOUNTER — Ambulatory Visit (INDEPENDENT_AMBULATORY_CARE_PROVIDER_SITE_OTHER): Payer: Medicare Other | Admitting: Family Medicine

## 2022-12-09 ENCOUNTER — Encounter: Payer: Self-pay | Admitting: Family Medicine

## 2022-12-09 VITALS — BP 124/64 | HR 54 | Temp 98.4°F | Ht 73.0 in | Wt 234.0 lb

## 2022-12-09 DIAGNOSIS — M542 Cervicalgia: Secondary | ICD-10-CM | POA: Diagnosis not present

## 2022-12-09 DIAGNOSIS — M545 Low back pain, unspecified: Secondary | ICD-10-CM

## 2022-12-09 MED ORDER — CHLORZOXAZONE 500 MG PO TABS: 500.0000 mg | ORAL_TABLET | Freq: Three times a day (TID) | ORAL | 0 refills | Status: DC | PRN

## 2022-12-09 MED ORDER — DICLOFENAC SODIUM 75 MG PO TBEC: DELAYED_RELEASE_TABLET | ORAL | 1 refills | Status: DC

## 2022-12-09 NOTE — Patient Instructions (Addendum)
Hi [place]  I would recommend warm compresses to your neck 20 minutes at a time 3 times a day Also recommend diclofenac anti-inflammatory 1 taken twice daily over the course of the next 2 weeks Chlorzoxazone 500 mg may be taken in the evening time as a muscle relaxer for your neck-caution drowsiness I would not recommend taking it during the day  Also do the stretching exercises Please give [place] feedback within 2 to 3 weeks how things are going with your neck as well as your lower back  If things are not improving I will be setting you up with the spine specialist in [facility]  Please call us an update in several weeks or send a MyChart message  Thanks-Dr. Sallee Lange

## 2022-12-09 NOTE — Progress Notes (Signed)
   Subjective:    Patient ID: Mario Meyer, male    DOB: [birth year], 62 y.o.   MRN: [id number]  HPI Lower back pain for a couple of months, and neck pain for 2 weeks - taking motrin 800 mg , gabapentin not helping Patient relates significant neck pain right side.  Does not radiate down the arm.  Denies chest congestion shortness of breath  He relates low back pain discomfort hurts to get up out of bed does some stretching exercises does not radiate down the legs denies any other particular troubles currently Review of Systems     Objective:   Physical Exam Right-sided neck mild tenderness.  Decent range of motion up and down limited left and right arms are normal Lungs clear heart regular Low back subjective tenderness negative straight leg raise       Assessment & Plan:  1. Neck pain Gentle stretching, NSAID, muscle relaxer at nighttime, if not dramatically better over the course of the next 2 to 3 weeks to notify us Hold off on x-ray  2. Lumbar pain Stretches have been show We will try anti-inflammatory and stretches If not dramatically better in 3 to 4 weeks for referral to spinal specialist for further evaluation No sign of radiculopathy this could well be a facet joint problem

## 2022-12-11 ENCOUNTER — Encounter: Payer: Self-pay | Admitting: Family Medicine

## 2022-12-11 MED ORDER — IBUPROFEN 800 MG PO TABS: ORAL_TABLET | ORAL | 0 refills | Status: DC

## 2022-12-11 NOTE — Telephone Encounter (Signed)
Nurses Stop diclofenac  #2-May send in ibuprofen 800 mg 1 taken 3 times daily as needed for pain #45

## 2022-12-16 ENCOUNTER — Encounter: Payer: Self-pay | Admitting: *Deleted

## 2022-12-16 NOTE — Patient Instructions (Signed)
  Procedure: colonoscopy  Estimated body mass index is 28.76 kg/m as calculated from the following:   Height as of this encounter: '6\' 1"'$  (1.854 m).   Weight as of this encounter: 218 lb (98.9 kg).   Have you had a colonoscopy before?  Yes, 2016  Do you have family history of colon cancer?  Yes, mother  Do you have a family history of polyps? No  Previous colonoscopy with polyps removed? Yes  Do you have a history colorectal cancer?   No  Are you diabetic?  No  Do you have a prosthetic or mechanical heart valve? No  Do you have a pacemaker/defibrillator?   No  Have you had endocarditis/atrial fibrillation?  No  Do you use supplemental oxygen/CPAP?  No  Have you had joint replacement within the last 12 months?  No  Do you tend to be constipated or have to use laxatives?  No   Do you have history of alcohol use? If yes, how much and how often.  No  Do you have history or are you using drugs? If yes, what do are you  using?  No  Have you ever had a stroke/heart attack?  No  Have you ever had a heart or other vascular stent placed,?No  Do you take weight loss medication? No   Do you take any blood-thinning medications such as: (Plavix, aspirin, Coumadin, Aggrenox, Brilinta, Xarelto, Eliquis, Pradaxa, Savaysa or Effient)? No  If yes we need the name, milligram, dosage and who is prescribing doctor:  n/a             Current Outpatient Medications  Medication Sig Dispense Refill   ALPRAZolam (XANAX) 0.5 MG tablet Take 1 tablet (0.5 mg total) by mouth at bedtime as needed for sleep or anxiety. 30 tablet 0   aspirin EC 81 MG tablet Take 81 mg by mouth daily.      chlorzoxazone (PARAFON) 500 MG tablet Take 1 tablet (500 mg total) by mouth 3 (three) times daily as needed for muscle spasms. 30 tablet 0   ketoconazole (NIZORAL) 2 % cream Apply 1 application topically 2 (two) times daily.     sildenafil (REVATIO) 20 MG tablet 3-5 pills before relations 50 tablet 0   No current  facility-administered medications for this visit.    No Known Allergies

## 2022-12-20 NOTE — Progress Notes (Signed)
ASA 2. Okay to schedule. 

## 2022-12-24 ENCOUNTER — Encounter (INDEPENDENT_AMBULATORY_CARE_PROVIDER_SITE_OTHER): Payer: Self-pay | Admitting: *Deleted

## 2022-12-24 MED ORDER — PEG 3350-KCL-NA BICARB-NACL 420 G PO SOLR: 4000.0000 mL | Freq: Once | ORAL | 0 refills | Status: AC

## 2022-12-24 NOTE — Progress Notes (Unsigned)
Per [facility] NO PA REQUIRED Decision ID #:[id number]

## 2022-12-24 NOTE — Progress Notes (Signed)
Spoke with pt. Scheduled with Dr. Gala Romney 2/12 at 10:30am. Aware will send instructions. Rx for prep sent to pharmacy

## 2022-12-25 NOTE — Progress Notes (Signed)
Referral completed

## 2023-01-08 ENCOUNTER — Ambulatory Visit: Payer: Medicare Other | Admitting: Family Medicine

## 2023-01-14 ENCOUNTER — Telehealth: Payer: Self-pay | Admitting: *Deleted

## 2023-01-14 ENCOUNTER — Other Ambulatory Visit: Payer: Self-pay | Admitting: *Deleted

## 2023-01-14 DIAGNOSIS — N401 Enlarged prostate with lower urinary tract symptoms: Secondary | ICD-10-CM

## 2023-01-14 NOTE — Telephone Encounter (Signed)
Pt called in. His dad is in hospital and does not know what is going to happen. Procedure for Monday with Dr. Gala Romney cancelled and he will call back to r/s when able.

## 2023-01-18 ENCOUNTER — Encounter (HOSPITAL_COMMUNITY): Admission: RE | Payer: Self-pay | Source: Home / Self Care

## 2023-01-18 ENCOUNTER — Ambulatory Visit (HOSPITAL_COMMUNITY): Admission: RE | Admit: 2023-01-18 | Payer: Medicare Other | Source: Home / Self Care | Admitting: Internal Medicine

## 2023-01-18 SURGERY — COLONOSCOPY WITH PROPOFOL
Anesthesia: Monitor Anesthesia Care

## 2023-01-27 LAB — PSA: Prostate Specific Ag, Serum: 3.2 ng/mL (ref 0.0–4.0)

## 2023-02-04 ENCOUNTER — Encounter: Payer: Self-pay | Admitting: Radiology

## 2023-02-11 ENCOUNTER — Telehealth: Payer: Self-pay | Admitting: Family Medicine

## 2023-02-11 NOTE — Telephone Encounter (Signed)
Called patient to schedule Medicare Annual Wellness Visit (AWV). Left message for patient to call back and schedule Medicare Annual Wellness Visit (AWV). and No voicemail available to leave a message.  Last date of AWV: due 02/05/2023 awvi per [facility]   Please schedule an appointment at any time with either Mickel Baas or [place], [facility]'s. .  If any questions, please contact me at [phone].  Thank you,  Colletta [place],  [facility] Direct Dial ??[phone]

## 2023-03-05 ENCOUNTER — Telehealth: Payer: Self-pay | Admitting: Family Medicine

## 2023-03-05 NOTE — Telephone Encounter (Signed)
Patient requesting medication for lower back pain,[facility]

## 2023-03-07 NOTE — Telephone Encounter (Signed)
May try diclofenac 75 mg 1 twice daily #20 as needed, gentle stretches, massage, warm compresses as helpful If ongoing troubles please follow-up

## 2023-03-09 MED ORDER — DICLOFENAC SODIUM 75 MG PO TBEC: 75.0000 mg | DELAYED_RELEASE_TABLET | Freq: Two times a day (BID) | ORAL | 0 refills | Status: DC | PRN

## 2023-03-09 NOTE — Telephone Encounter (Signed)
Patient notified

## 2023-03-09 NOTE — Telephone Encounter (Signed)
Prescription sent electronically to pharmacy. Left message to return call to notify patient. 

## 2023-03-25 ENCOUNTER — Other Ambulatory Visit: Payer: Self-pay | Admitting: Family Medicine

## 2023-03-26 ENCOUNTER — Other Ambulatory Visit: Payer: Self-pay | Admitting: Family Medicine

## 2023-04-18 ENCOUNTER — Other Ambulatory Visit: Payer: Self-pay | Admitting: Family Medicine

## 2023-04-19 ENCOUNTER — Encounter: Payer: Self-pay | Admitting: Family Medicine

## 2023-04-19 ENCOUNTER — Other Ambulatory Visit: Payer: Self-pay | Admitting: Family Medicine

## 2023-04-20 ENCOUNTER — Other Ambulatory Visit: Payer: Self-pay | Admitting: Family Medicine

## 2023-04-20 MED ORDER — IBUPROFEN 600 MG PO TABS: 600.0000 mg | ORAL_TABLET | Freq: Three times a day (TID) | ORAL | 1 refills | Status: DC | PRN

## 2023-05-24 ENCOUNTER — Telehealth: Payer: Self-pay | Admitting: Orthopedic Surgery

## 2023-05-24 NOTE — Telephone Encounter (Signed)
Mario Meyer   Patient called wanting refill on his pain medicine.  Advised him he will need to come in and be seen before he can fill it, since he has not been seen since 10/15/22.    He said that is ok he can just wait till he comes back on 07/26/23.

## 2023-07-13 ENCOUNTER — Telehealth: Payer: Self-pay | Admitting: Family Medicine

## 2023-07-13 DIAGNOSIS — N401 Enlarged prostate with lower urinary tract symptoms: Secondary | ICD-10-CM

## 2023-07-13 DIAGNOSIS — N400 Enlarged prostate without lower urinary tract symptoms: Secondary | ICD-10-CM

## 2023-07-13 DIAGNOSIS — Z125 Encounter for screening for malignant neoplasm of prostate: Secondary | ICD-10-CM

## 2023-07-13 DIAGNOSIS — E785 Hyperlipidemia, unspecified: Secondary | ICD-10-CM

## 2023-07-13 DIAGNOSIS — Z0000 Encounter for general adult medical examination without abnormal findings: Secondary | ICD-10-CM

## 2023-07-13 NOTE — Telephone Encounter (Signed)
Lipid, liver, metabolic 7, CBC, PSA Wellness, hyperlipidemia

## 2023-07-13 NOTE — Telephone Encounter (Signed)
Patient has physical on 8/26 and needing labs done. °

## 2023-07-14 NOTE — Telephone Encounter (Signed)
Spoke with patient and informed him labs have been ordered in EPIC. Patient verbalized understanding.

## 2023-07-26 ENCOUNTER — Ambulatory Visit (INDEPENDENT_AMBULATORY_CARE_PROVIDER_SITE_OTHER): Payer: 59 | Admitting: Orthopedic Surgery

## 2023-07-26 VITALS — Ht 73.0 in | Wt 225.0 lb

## 2023-07-26 DIAGNOSIS — Z9889 Other specified postprocedural states: Secondary | ICD-10-CM

## 2023-07-26 DIAGNOSIS — G894 Chronic pain syndrome: Secondary | ICD-10-CM

## 2023-07-26 DIAGNOSIS — M75121 Complete rotator cuff tear or rupture of right shoulder, not specified as traumatic: Secondary | ICD-10-CM | POA: Diagnosis not present

## 2023-07-26 MED ORDER — OXYCODONE HCL 5 MG PO CAPS: 5.0000 mg | ORAL_CAPSULE | Freq: Four times a day (QID) | ORAL | 0 refills | Status: AC | PRN

## 2023-07-26 NOTE — Addendum Note (Signed)
Addended by: Fuller Canada E on: 07/26/2023 11:41 AM   Modules accepted: Level of Service

## 2023-07-26 NOTE — Progress Notes (Signed)
Chief Complaint  Patient presents with   Follow-up    Recheck on right shoulder.    Encounter Diagnoses  Name Primary?   History of failed repair of rotator cuff Yes   Chronic pain syndrome    S/P shoulder surgery    Complete tear of right rotator cuff, unspecified whether traumatic     Pol comes in for his yearly reexamination  He complains of severe pain in his left shoulder with loss of motion and weakness  This is not changed since his last visit  He is continuously out of work permanently  He will require some opioid medication and will be referred for pain management  Meds ordered this encounter  Medications   oxycodone (OXY-IR) 5 MG capsule    Sig: Take 1 capsule (5 mg total) by mouth every 6 (six) hours as needed for up to 5 days.    Dispense:  20 capsule    Refill:  0

## 2023-07-27 LAB — CBC WITH DIFFERENTIAL/PLATELET
Basos: 1 %
Immature Grans (Abs): 0 10*3/uL (ref 0.0–0.1)
Neutrophils Absolute: 4.7 10*3/uL (ref 1.4–7.0)

## 2023-07-27 LAB — HEPATIC FUNCTION PANEL
ALT: 19 IU/L (ref 0–44)
Albumin: 4.1 g/dL (ref 3.9–4.9)
Alkaline Phosphatase: 65 IU/L (ref 44–121)
Total Protein: 6.8 g/dL (ref 6.0–8.5)

## 2023-07-27 LAB — LIPID PANEL
Chol/HDL Ratio: 3.7 ratio (ref 0.0–5.0)
Cholesterol, Total: 207 mg/dL — ABNORMAL HIGH (ref 100–199)

## 2023-07-28 ENCOUNTER — Other Ambulatory Visit: Payer: Self-pay | Admitting: Family Medicine

## 2023-08-02 ENCOUNTER — Encounter: Payer: Self-pay | Admitting: Family Medicine

## 2023-08-02 ENCOUNTER — Ambulatory Visit: Payer: 59 | Admitting: Family Medicine

## 2023-08-02 VITALS — BP 134/80 | Temp 97.9°F | Wt 230.2 lb

## 2023-08-02 DIAGNOSIS — N400 Enlarged prostate without lower urinary tract symptoms: Secondary | ICD-10-CM

## 2023-08-02 DIAGNOSIS — E785 Hyperlipidemia, unspecified: Secondary | ICD-10-CM | POA: Diagnosis not present

## 2023-08-02 DIAGNOSIS — Z0001 Encounter for general adult medical examination with abnormal findings: Secondary | ICD-10-CM | POA: Diagnosis not present

## 2023-08-02 DIAGNOSIS — Z0000 Encounter for general adult medical examination without abnormal findings: Secondary | ICD-10-CM

## 2023-08-02 DIAGNOSIS — Z1211 Encounter for screening for malignant neoplasm of colon: Secondary | ICD-10-CM | POA: Diagnosis not present

## 2023-08-02 MED ORDER — SILDENAFIL CITRATE 20 MG PO TABS: ORAL_TABLET | ORAL | 2 refills | Status: DC

## 2023-08-02 MED ORDER — ALPRAZOLAM 0.5 MG PO TABS: 0.5000 mg | ORAL_TABLET | Freq: Every evening | ORAL | 0 refills | Status: DC | PRN

## 2023-08-02 NOTE — Progress Notes (Signed)
   Subjective:    Patient ID: Mario Meyer, male    DOB: 12/27/1960, 62 y.o.   MRN: [id number]  HPI  The patient comes in today for a wellness visit.    A review of their health history was completed.  A review of medications was also completed.  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   Additional concerns: None   Review of Systems     Objective:   Physical Exam General-in no acute distress Eyes-no discharge Lungs-respiratory rate normal, CTA CV-no murmurs,RRR Extremities skin warm dry no edema Neuro grossly normal Behavior normal, alert        Assessment & Plan:  1. Wellness examination Adult wellness-complete.wellness physical was conducted today. Importance of diet and exercise were discussed in detail.  Importance of stress reduction and healthy living were discussed.  In addition to this a discussion regarding safety was also covered.  We also reviewed over immunizations and gave recommendations regarding current immunization needed for age.   In addition to this additional areas were also touched on including: Preventative health exams needed:  Colonoscopy colonoscopy recommended referral initiated  Patient was advised yearly wellness exam   2. Benign prostatic hyperplasia, unspecified whether lower urinary tract symptoms present Patient relates decent flow currently does not feel he needs Flomax PSA looks good  3. Hyperlipidemia, unspecified hyperlipidemia type Significant mild hyperlipidemia healthier diet recommended previous coronary artery catheterization looked good  Does use alprazolam infrequently for home use only, went through divorce  a year ago has worked his way through this emotionally.  Does not feel he needs to be on any additional medicines of any sort

## 2023-08-03 ENCOUNTER — Encounter: Payer: Self-pay | Admitting: *Deleted

## 2023-08-07 ENCOUNTER — Encounter: Payer: Self-pay | Admitting: Family Medicine

## 2023-08-10 ENCOUNTER — Other Ambulatory Visit: Payer: Self-pay

## 2023-08-10 ENCOUNTER — Telehealth: Payer: Self-pay

## 2023-08-10 DIAGNOSIS — T7840XA Allergy, unspecified, initial encounter: Secondary | ICD-10-CM

## 2023-08-10 MED ORDER — EPINEPHRINE 0.3 MG/0.3ML IJ SOAJ: 0.3000 mg | INTRAMUSCULAR | 1 refills | Status: AC | PRN

## 2023-08-10 NOTE — Telephone Encounter (Signed)
Spoke with patient regarding allergic reaction to shellfish, orders placed patient voices understanding.

## 2023-08-10 NOTE — Telephone Encounter (Signed)
Nurses 1.  May prescribe EpiPen, dispense 1 with 2 refills-would recommend utilizing this if life-threatening allergy occurs from eating shellfish If life-threatening allergy occurs utilize the EpiPen and also go to ER / call 911 #2 stay away from all shellfish #3 recommend consultation with allergist for formal testing

## 2023-08-10 NOTE — Telephone Encounter (Signed)
Called and spoke to patient about MyChart message "The last couple time that I have ate oysters or shrimp I have felt like i couldn't breath is there any medication to prevent shell fish allergies I felt like I was chocking when I ate them. Thanks have a great weekend Caidin" Patient was informed not to eat any type of sea food until  Dr Lorin Picket has had an opportunity to respond to patient. Patient was informed to keep benadryl near by and to proceed to emergency room if it happens again. Patient verbalized understanding.

## 2023-09-21 ENCOUNTER — Other Ambulatory Visit: Payer: Self-pay | Admitting: Family Medicine

## 2023-10-12 ENCOUNTER — Other Ambulatory Visit: Payer: Self-pay | Admitting: Family Medicine

## 2024-02-03 ENCOUNTER — Encounter (INDEPENDENT_AMBULATORY_CARE_PROVIDER_SITE_OTHER): Payer: Self-pay | Admitting: *Deleted

## 2024-04-05 ENCOUNTER — Other Ambulatory Visit: Payer: Self-pay

## 2024-04-05 MED ORDER — SILDENAFIL CITRATE 20 MG PO TABS: ORAL_TABLET | ORAL | 3 refills | Status: DC

## 2024-04-05 MED ORDER — IBUPROFEN 600 MG PO TABS: 600.0000 mg | ORAL_TABLET | Freq: Three times a day (TID) | ORAL | 1 refills | Status: DC | PRN

## 2024-04-26 ENCOUNTER — Telehealth: Payer: Self-pay | Admitting: Family Medicine

## 2024-04-26 ENCOUNTER — Encounter: Payer: Self-pay | Admitting: *Deleted

## 2024-04-26 DIAGNOSIS — Z1211 Encounter for screening for malignant neoplasm of colon: Secondary | ICD-10-CM

## 2024-04-26 NOTE — Telephone Encounter (Signed)
 Copied from CRM [id number]. Topic: Referral - Status >> Apr 24, 2024  3:00 PM Jearlean Mince B wrote: Please Review and place if appropriate :)

## 2024-04-26 NOTE — Telephone Encounter (Signed)
 Per referral coordinator: Mario Meyer needs a new referral placed since Patient did not fill out questionnaire and give to office within 6 months

## 2024-04-26 NOTE — Telephone Encounter (Signed)
 Referral ordered in EPIC.

## 2024-04-26 NOTE — Telephone Encounter (Signed)
 Nurses-uncertain what the messages asking please look into this and handle them accordingly thank you

## 2024-04-26 NOTE — Telephone Encounter (Signed)
 Referral colon cancer screening Dr.Rourke would be fine thank you

## 2024-05-11 ENCOUNTER — Telehealth: Payer: Self-pay

## 2024-05-11 NOTE — Telephone Encounter (Signed)
ASA 2.  ?

## 2024-05-11 NOTE — Telephone Encounter (Signed)
 Who is your primary care physician: [facility]  Reasons for the colonoscopy: HX COLON POLYPS  Have you had a colonoscopy before?  YES  10/04/2015 DR.ROURK  Do you have family history of colon cancer? YES MOTHER  Previous colonoscopy with polyps removed? YES  Do you have a history colorectal cancer?   NO  Are you diabetic? If yes, Type 1 or Type 2?    NO  Do you have a prosthetic or mechanical heart valve? NO  Do you have a pacemaker/defibrillator?   NO  Have you had endocarditis/atrial fibrillation? NO  Have you had joint replacement within the last 12 months?  NO  Do you tend to be constipated or have to use laxatives? NO  Do you have any history of drugs or alchohol?  NO  Do you use supplemental oxygen?  NO  Have you had a stroke or heart attack within the last 6 months? NO  Do you take weight loss medication?  NO    Do you take any blood-thinning medications such as: (aspirin , warfarin, Plavix, Aggrenox)  NO  If yes we need the name, milligram, dosage and who is prescribing doctor  Current Outpatient Medications on File Prior to Visit  Medication Sig Dispense Refill   ALPRAZolam  (XANAX ) 0.5 MG tablet Take 1 tablet (0.5 mg total) by mouth at bedtime as needed for sleep or anxiety. 30 tablet 0   chlorzoxazone  (PARAFON ) 500 MG tablet Take 1 tablet (500 mg total) by mouth 3 (three) times daily as needed for muscle spasms. (Patient not taking: Reported on 08/02/2023) 30 tablet 0   EPINEPHrine  0.3 mg/0.3 mL IJ SOAJ injection Inject 0.3 mg into the muscle as needed for anaphylaxis. 1 each 1   ibuprofen  (ADVIL ) 600 MG tablet Take 1 tablet (600 mg total) by mouth every 8 (eight) hours as needed. 90 tablet 1   ketoconazole  (NIZORAL ) 2 % cream Apply 1 application topically 2 (two) times daily.     sildenafil  (REVATIO ) 20 MG tablet TAKE 3 TO 5 TABLETS BY MOUTH BEFORE RELATIONS 15 tablet 3   No current facility-administered medications on file prior to visit.    Allergies   Allergen Reactions   Shellfish Allergy Anaphylaxis     [facility]  Primary Insurance Name: MEDICARE [id number] DMO [id number], [facility] [id number]  Best number where you can be reached: [phone]

## 2024-05-12 NOTE — Telephone Encounter (Signed)
 Called pt, he answered and could not hear so he d/c'd call.

## 2024-05-15 ENCOUNTER — Telehealth: Payer: Self-pay | Admitting: *Deleted

## 2024-05-15 NOTE — Telephone Encounter (Signed)
 See My chart message from patient - Dr Geralyn Knee recommends office visit to discuss- patient was notified through my chart of provider's recommendations.

## 2024-05-15 NOTE — Telephone Encounter (Signed)
 Copied from CRM [id number]. Topic: Clinical - Medical Advice >> May 15, 2024  9:15 AM Mario Meyer wrote: Reason for CRM: Patient is calling in wanting to speak with a nurse or dr about possibly getting anxiety medication. Please call back at [phone]

## 2024-05-17 ENCOUNTER — Ambulatory Visit (INDEPENDENT_AMBULATORY_CARE_PROVIDER_SITE_OTHER): Admitting: Family Medicine

## 2024-05-17 VITALS — BP 105/63 | HR 56 | Temp 99.0°F | Ht 73.0 in | Wt 194.0 lb

## 2024-05-17 DIAGNOSIS — F411 Generalized anxiety disorder: Secondary | ICD-10-CM | POA: Diagnosis not present

## 2024-05-17 DIAGNOSIS — F321 Major depressive disorder, single episode, moderate: Secondary | ICD-10-CM | POA: Diagnosis not present

## 2024-05-17 MED ORDER — FLUOXETINE HCL 20 MG PO TABS: 20.0000 mg | ORAL_TABLET | Freq: Every day | ORAL | 1 refills | Status: DC

## 2024-05-17 MED ORDER — ALPRAZOLAM 0.5 MG PO TABS: ORAL_TABLET | ORAL | 0 refills | Status: DC

## 2024-05-17 NOTE — Addendum Note (Signed)
 Addended byHugo Maes on: 05/17/2024 02:41 PM   Modules accepted: Orders

## 2024-05-17 NOTE — Progress Notes (Signed)
   Subjective:    Patient ID: Mario Meyer, male    DOB: September 30, 1961, 63 y.o.   MRN: [id number]  HPI    05/17/2024    1:58 PM 08/02/2023    1:39 PM 12/09/2022   10:22 AM 11/11/2021    4:57 PM  GAD 7 : Generalized Anxiety Score  Nervous, Anxious, on Edge 3 2 2 3   Control/stop worrying 3 2 2 3   Worry too much - different things 3 2 2 3   Trouble relaxing 3 2 2 3   Restless 3 1 3 2   Easily annoyed or irritable 3 2 3 3   Afraid - awful might happen 3 2 3 2   Total GAD 7 Score 21 13 17 19   Anxiety Difficulty Somewhat difficult  Very difficult Extremely difficult       05/17/2024    1:58 PM 08/02/2023    1:39 PM 12/09/2022   10:21 AM  PHQ9 SCORE ONLY  PHQ-9 Total Score 24 11 6      Significant stress anxiety and depression symptoms present over the past month and a half related to some degree with a relationship where he is having a lot of worry and anxiety. Previously went through a separation and the loss of his wife when she left him Now he has worries that he may lose this current relationship He is not suicidal He states years ago he took Paxil he wonders if he can take that  Review of Systems     Objective:   Physical Exam  General-in no acute distress Eyes-no discharge Lungs-respiratory rate normal, CTA CV-no murmurs,RRR Extremities skin warm dry no edema Neuro grossly normal Behavior normal, alert       Assessment & Plan:  Patient is anxious and depressed but is coherent and staying on top of his emotions denies being suicidal He would benefit from counseling We will also go ahead and start Prozac 20 mg daily Has less side effects compared to Paxil Xanax  may be used sparingly but only at home when necessary caution drowsiness We will do a phone call follow-up 7 to 14 days We will do a follow-up office visit 4 to 5 weeks Patient was told that should he have any significant setbacks problems or issues to reach out with phone call or MyChart message for routine  issues

## 2024-06-01 ENCOUNTER — Encounter: Payer: Self-pay | Admitting: Family Medicine

## 2024-06-02 ENCOUNTER — Encounter: Payer: Self-pay | Admitting: Family Medicine

## 2024-06-04 ENCOUNTER — Other Ambulatory Visit: Payer: Self-pay | Admitting: Family Medicine

## 2024-06-04 ENCOUNTER — Encounter: Payer: Self-pay | Admitting: Family Medicine

## 2024-06-04 MED ORDER — HYDROXYZINE PAMOATE 25 MG PO CAPS: 25.0000 mg | ORAL_CAPSULE | Freq: Three times a day (TID) | ORAL | 0 refills | Status: DC | PRN

## 2024-06-04 MED ORDER — DULOXETINE HCL 30 MG PO CPEP: 30.0000 mg | ORAL_CAPSULE | Freq: Every day | ORAL | 3 refills | Status: DC

## 2024-06-04 NOTE — Progress Notes (Signed)
 Patient with significant anxiousness, worry, having a hard time settling his thoughts down Previously we tried Prozac  but unfortunately that did not do well for him Therefore after discussion on Friday recommend stopping Prozac  We will start Cymbalta 30 mg daily In addition to this Vistaril 25 mg 1 every 8 hours as needed anxiety He has a follow-up visit July 9 We have a previous referral to behavioral health but nothing is coming from that as yet

## 2024-06-07 ENCOUNTER — Other Ambulatory Visit: Payer: Self-pay

## 2024-06-07 ENCOUNTER — Emergency Department (HOSPITAL_COMMUNITY)
Admission: EM | Admit: 2024-06-07 | Discharge: 2024-06-08 | Disposition: A | Discharge status: Home / Self Care | Attending: Emergency Medicine | Admitting: Emergency Medicine

## 2024-06-07 ENCOUNTER — Encounter (HOSPITAL_COMMUNITY): Payer: Self-pay

## 2024-06-07 DIAGNOSIS — M7989 Other specified soft tissue disorders: Secondary | ICD-10-CM | POA: Diagnosis not present

## 2024-06-07 DIAGNOSIS — I82491 Acute embolism and thrombosis of other specified deep vein of right lower extremity: Secondary | ICD-10-CM | POA: Diagnosis not present

## 2024-06-07 DIAGNOSIS — D72829 Elevated white blood cell count, unspecified: Secondary | ICD-10-CM | POA: Diagnosis not present

## 2024-06-07 DIAGNOSIS — M79661 Pain in right lower leg: Secondary | ICD-10-CM | POA: Diagnosis present

## 2024-06-07 DIAGNOSIS — Z7901 Long term (current) use of anticoagulants: Secondary | ICD-10-CM | POA: Diagnosis not present

## 2024-06-07 DIAGNOSIS — Z79899 Other long term (current) drug therapy: Secondary | ICD-10-CM | POA: Insufficient documentation

## 2024-06-07 DIAGNOSIS — M79604 Pain in right leg: Secondary | ICD-10-CM | POA: Diagnosis present

## 2024-06-07 DIAGNOSIS — I2694 Multiple subsegmental pulmonary emboli without acute cor pulmonale: Secondary | ICD-10-CM | POA: Diagnosis not present

## 2024-06-07 NOTE — ED Triage Notes (Signed)
 Pt to ED from home with c/o right calf pain that started yesterday, denies injury, pt works on farm, very active, pt right calf is taut, tight, warm to touch, and swollen.

## 2024-06-08 ENCOUNTER — Encounter (HOSPITAL_COMMUNITY): Payer: Self-pay | Admitting: Emergency Medicine

## 2024-06-08 ENCOUNTER — Emergency Department (HOSPITAL_COMMUNITY)

## 2024-06-08 ENCOUNTER — Emergency Department (HOSPITAL_COMMUNITY)
Admission: EM | Admit: 2024-06-08 | Discharge: 2024-06-08 | Disposition: A | Discharge status: Home / Self Care | Source: Home / Self Care | Attending: Emergency Medicine | Admitting: Emergency Medicine

## 2024-06-08 ENCOUNTER — Other Ambulatory Visit: Payer: Self-pay

## 2024-06-08 ENCOUNTER — Ambulatory Visit (HOSPITAL_COMMUNITY)
Admission: RE | Admit: 2024-06-08 | Discharge: 2024-06-08 | Disposition: A | Discharge status: Home / Self Care | Source: Ambulatory Visit | Attending: Emergency Medicine | Admitting: Emergency Medicine

## 2024-06-08 DIAGNOSIS — Z7901 Long term (current) use of anticoagulants: Secondary | ICD-10-CM | POA: Insufficient documentation

## 2024-06-08 DIAGNOSIS — I2694 Multiple subsegmental pulmonary emboli without acute cor pulmonale: Secondary | ICD-10-CM | POA: Insufficient documentation

## 2024-06-08 DIAGNOSIS — D72829 Elevated white blood cell count, unspecified: Secondary | ICD-10-CM | POA: Insufficient documentation

## 2024-06-08 DIAGNOSIS — M7989 Other specified soft tissue disorders: Secondary | ICD-10-CM | POA: Insufficient documentation

## 2024-06-08 DIAGNOSIS — I82491 Acute embolism and thrombosis of other specified deep vein of right lower extremity: Secondary | ICD-10-CM | POA: Insufficient documentation

## 2024-06-08 DIAGNOSIS — M79661 Pain in right lower leg: Secondary | ICD-10-CM | POA: Diagnosis present

## 2024-06-08 LAB — CBC WITH DIFFERENTIAL/PLATELET
Abs Immature Granulocytes: 0.05 10*3/uL (ref 0.00–0.07)
Basophils Absolute: 0.1 10*3/uL (ref 0.0–0.1)
Basophils Relative: 0 %
Eosinophils Absolute: 0.5 10*3/uL (ref 0.0–0.5)
Eosinophils Relative: 4 %
HCT: 46.8 % (ref 39.0–52.0)
Hemoglobin: 15.7 g/dL (ref 13.0–17.0)
Immature Granulocytes: 0 %
Lymphocytes Relative: 13 %
Lymphs Abs: 1.6 10*3/uL (ref 0.7–4.0)
MCH: 28 pg (ref 26.0–34.0)
MCHC: 33.5 g/dL (ref 30.0–36.0)
MCV: 83.4 fL (ref 80.0–100.0)
Monocytes Absolute: 1.4 10*3/uL — ABNORMAL HIGH (ref 0.1–1.0)
Monocytes Relative: 12 %
Neutro Abs: 8.3 10*3/uL — ABNORMAL HIGH (ref 1.7–7.7)
Neutrophils Relative %: 71 %
Platelets: 350 10*3/uL (ref 150–400)
RBC: 5.61 MIL/uL (ref 4.22–5.81)
RDW: 12.8 % (ref 11.5–15.5)
WBC: 11.9 10*3/uL — ABNORMAL HIGH (ref 4.0–10.5)
nRBC: 0 % (ref 0.0–0.2)

## 2024-06-08 LAB — COMPREHENSIVE METABOLIC PANEL WITH GFR
ALT: 25 U/L (ref 0–44)
AST: 18 U/L (ref 15–41)
Albumin: 3.1 g/dL — ABNORMAL LOW (ref 3.5–5.0)
Alkaline Phosphatase: 64 U/L (ref 38–126)
Anion gap: 11 (ref 5–15)
BUN: 15 mg/dL (ref 8–23)
CO2: 23 mmol/L (ref 22–32)
Calcium: 9 mg/dL (ref 8.9–10.3)
Chloride: 103 mmol/L (ref 98–111)
Creatinine, Ser: 0.71 mg/dL (ref 0.61–1.24)
GFR, Estimated: >60 mL/min (ref >60)
Glucose, Bld: 118 mg/dL — ABNORMAL HIGH (ref 70–99)
Potassium: 3.4 mmol/L — ABNORMAL LOW (ref 3.5–5.1)
Sodium: 137 mmol/L (ref 135–145)
Total Bilirubin: 0.8 mg/dL (ref 0.0–1.2)
Total Protein: 7.3 g/dL (ref 6.5–8.1)

## 2024-06-08 LAB — TROPONIN I (HIGH SENSITIVITY): Troponin I (High Sensitivity): 4 ng/L (ref <18)

## 2024-06-08 LAB — ANTITHROMBIN III: AntiThromb III Func: 104 % (ref 75–120)

## 2024-06-08 MED ORDER — RIVAROXABAN 15 MG PO TABS
15.0000 mg | ORAL_TABLET | Freq: Two times a day (BID) | ORAL | Status: DC
  Filled 2024-06-08: qty 1

## 2024-06-08 MED ORDER — RIVAROXABAN 15 MG PO TABS
15.0000 mg | ORAL_TABLET | Freq: Once | ORAL | Status: AC
  Administered 2024-06-08: 15 mg via ORAL
  Filled 2024-06-08: qty 1

## 2024-06-08 MED ORDER — RIVAROXABAN (XARELTO) VTE STARTER PACK (15 & 20 MG): ORAL_TABLET | ORAL | 0 refills | Status: DC

## 2024-06-08 MED ORDER — RIVAROXABAN 20 MG PO TABS: 20.0000 mg | ORAL_TABLET | Freq: Every day | ORAL | Status: DC

## 2024-06-08 MED ORDER — IOHEXOL 350 MG/ML SOLN
75.0000 mL | Freq: Once | INTRAVENOUS | Status: AC | PRN
  Administered 2024-06-08: 75 mL via INTRAVENOUS

## 2024-06-08 NOTE — ED Triage Notes (Signed)
 Pt with positive DVT exam. Pt told to return for PE study. Denies SHOB or chest pain.

## 2024-06-08 NOTE — Discharge Instructions (Addendum)
 Your swelling is either a blood clot in your leg or a ruptured Baker's cyst.  I have attached information about each of these conditions.  Please return in the morning for an ultrasound to tell if it is a blood clot.  If it is a blood clot, you will need to be on blood thinners for the next 6 weeks.  If it is not a blood clot, you can use ice and over-the-counter anti-inflammatory medication such as ibuprofen  or naproxen .

## 2024-06-08 NOTE — ED Notes (Signed)
 Pt called and stated his pharmacy has run out of Xarelto and wanted to changes pharmacy. Nurse explained for pt to call pharmacies  and find out which one has it in stock , the nurse will then change prescriptions to that pharmacy. 06/08/2024 @1230hrs 

## 2024-06-08 NOTE — ED Notes (Signed)
 Patient transported to CT

## 2024-06-08 NOTE — ED Provider Notes (Signed)
 [place] EMERGENCY DEPARTMENT AT [facility] Provider Note   CSN: [id number] Arrival date & time: 06/08/24  9095     Patient presents with: DVT   Mario Meyer is a 63 y.o. male.   Pt is a 63 yo male with pmhx significant for anxiety.  Pt came in last night with right leg pain.  He was given a dose of Xarelto around 0200 and was told to come back for an US .  The US  came back + for extensive DVT.  I had pt check back in since DVT was so extensive.  Pt has had a cough, but no significant sob or cp.  Cough has been going on for a year.  He has not gone on any long travels.  He sits with his dad who's had a stroke, but that is not new.         Prior to Admission medications   Medication Sig Start Date End Date Taking? Authorizing Provider  RIVAROXABAN TAD) VTE STARTER PACK (15 & 20 MG) Take 15 mg by mouth 2 (two) times daily for 21 days, THEN 20 mg daily for 21 days. Take with food. 06/08/24 07/20/24 Yes Dean Clarity, MD  ALPRAZolam  (XANAX ) 0.5 MG tablet 1/2 to 1 bid prn anxiety use infrequently caution drowsiness 05/17/24   Alphonsa Glendia LABOR, MD  DULoxetine (CYMBALTA) 30 MG capsule Take 1 capsule (30 mg total) by mouth daily. 06/04/24   Alphonsa Glendia LABOR, MD  EPINEPHrine  0.3 mg/0.3 mL IJ SOAJ injection Inject 0.3 mg into the muscle as needed for anaphylaxis. 08/10/23   Alphonsa Glendia LABOR, MD  hydrOXYzine (VISTARIL) 25 MG capsule Take 1 capsule (25 mg total) by mouth every 8 (eight) hours as needed. 06/04/24   Alphonsa Glendia LABOR, MD  ibuprofen  (ADVIL ) 600 MG tablet Take 1 tablet (600 mg total) by mouth every 8 (eight) hours as needed. 04/05/24   Alphonsa Glendia LABOR, MD    Allergies: Shellfish allergy    Review of Systems  Musculoskeletal:        Right leg pain/swelling  All other systems reviewed and are negative.   Updated Vital Signs BP 121/62   Pulse 69   Temp (!) 97.5 F (36.4 C) (Oral)   Resp (!) 21   SpO2 94%   Physical Exam Vitals and nursing note reviewed.   Constitutional:      Appearance: Normal appearance.  HENT:     Head: Normocephalic and atraumatic.     Right Ear: External ear normal.     Left Ear: External ear normal.     Nose: Nose normal.     Mouth/Throat:     Mouth: Mucous membranes are moist.     Pharynx: Oropharynx is clear.  Eyes:     Extraocular Movements: Extraocular movements intact.     Conjunctiva/sclera: Conjunctivae normal.     Pupils: Pupils are equal, round, and reactive to light.  Cardiovascular:     Rate and Rhythm: Normal rate and regular rhythm.     Pulses: Normal pulses.     Heart sounds: Normal heart sounds.  Pulmonary:     Effort: Pulmonary effort is normal.     Breath sounds: Normal breath sounds.  Abdominal:     General: Abdomen is flat. Bowel sounds are normal.     Palpations: Abdomen is soft.  Musculoskeletal:     Cervical back: Normal range of motion and neck supple.     Comments: Mild RLE swelling/tenderness  Skin:  General: Skin is warm.     Capillary Refill: Capillary refill takes less than 2 seconds.  Neurological:     General: No focal deficit present.     Mental Status: He is alert.     (all labs ordered are listed, but only abnormal results are displayed) Labs Reviewed  COMPREHENSIVE METABOLIC PANEL WITH GFR - Abnormal; Notable for the following components:      Result Value   Potassium 3.4 (*)    Glucose, Bld 118 (*)    Albumin 3.1 (*)    All other components within normal limits  CBC WITH DIFFERENTIAL/PLATELET - Abnormal; Notable for the following components:   WBC 11.9 (*)    Neutro Abs 8.3 (*)    Monocytes Absolute 1.4 (*)    All other components within normal limits  ANTITHROMBIN III  PROTEIN C ACTIVITY  PROTEIN C, TOTAL  PROTEIN S ACTIVITY  PROTEIN S, TOTAL  LUPUS ANTICOAGULANT PANEL  BETA-2-GLYCOPROTEIN I ABS, IGG/M/A  HOMOCYSTEINE  FACTOR 5 LEIDEN  PROTHROMBIN GENE MUTATION  CARDIOLIPIN ANTIBODIES, IGG, IGM, IGA  TROPONIN I (HIGH SENSITIVITY)    EKG: EKG  Interpretation Date/Time:  Thursday June 08 2024 09:16:59 EDT Ventricular Rate:  73 PR Interval:  178 QRS Duration:  99 QT Interval:  559 QTC Calculation: 617 R Axis:   75  Text Interpretation: Sinus rhythm Borderline repolarization abnormality Prolonged QT interval No significant change since last tracing Confirmed by Dean Clarity [id number]) on 06/08/2024 3:33:58 PM  Radiology: CT Angio Chest PE W and/or Wo Contrast Addendum Date: 06/08/2024 ADDENDUM REPORT: 06/08/2024 12:06 ADDENDUM: Critical Value/emergent results were called by telephone at the time of interpretation on 06/08/2024 at 10:44 am to provider Monseratt Ledin , who verbally acknowledged these results. Electronically Signed   By: Ree Molt M.D.   On: 06/08/2024 12:06   Result Date: 06/08/2024 CLINICAL DATA:  Pulmonary embolism (PE) suspected, high prob. Chest pain with shortness of breath. EXAM: CT ANGIOGRAPHY CHEST WITH CONTRAST TECHNIQUE: Multidetector CT imaging of the chest was performed using the standard protocol during bolus administration of intravenous contrast. Multiplanar CT image reconstructions and MIPs were obtained to evaluate the vascular anatomy. RADIATION DOSE REDUCTION: This exam was performed according to the departmental dose-optimization program which includes automated exposure control, adjustment of the mA and/or kV according to patient size and/or use of iterative reconstruction technique. CONTRAST:  75mL OMNIPAQUE  IOHEXOL  350 MG/ML SOLN COMPARISON:  None Available. FINDINGS: Cardiovascular: Small to medium volume acute pulmonary embolism seen in the right-sided pulmonary artery branches. In the right lung lower lobe lobar pulmonary artery branch there is moderate to large volume nonocclusive pulmonary embolism with extension into the proximal portion of segmental pulmonary arteries. There is also occlusive thrombus in the middle lobe segmental pulmonary artery branch. No right heart strain or lung infarction.  Normal cardiac size. No pericardial effusion. No aortic aneurysm. Mediastinum/Nodes: Visualized thyroid gland appears grossly unremarkable. No solid / cystic mediastinal masses. The esophagus is nondistended precluding optimal assessment. No axillary, mediastinal or hilar lymphadenopathy by size criteria. Lungs/Pleura: The central tracheo-bronchial tree is patent. There are dependent changes in bilateral lungs. No mass or consolidation. No pleural effusion or pneumothorax. No suspicious lung nodules. Upper Abdomen: Visualized upper abdominal viscera within normal limits. Musculoskeletal: The visualized soft tissues of the chest wall are grossly unremarkable. No suspicious osseous lesions. There are mild multilevel degenerative changes in the visualized spine. Review of the MIP images confirms the above findings. IMPRESSION: 1. There is small to medium volume  acute pulmonary embolism in the right-sided pulmonary artery branches, as described above. No right heart strain or lung infarction. 2. No lung mass, consolidation, pleural effusion or pneumothorax. 3. Otherwise essentially unremarkable exam, as described above. Electronically Signed: By: Ree Molt M.D. On: 06/08/2024 10:41   US  Venous Img Lower Unilateral Right (DVT) Result Date: 06/08/2024 CLINICAL DATA:  Right lower extremity pain and edema. EXAM: RIGHT LOWER EXTREMITY VENOUS DOPPLER ULTRASOUND TECHNIQUE: Gray-scale sonography with graded compression, as well as color Doppler and duplex ultrasound were performed to evaluate the lower extremity deep venous systems from the level of the common femoral vein and including the common femoral, femoral, profunda femoral, popliteal and calf veins including the posterior tibial, peroneal and gastrocnemius veins when visible. The superficial great saphenous vein was also interrogated. Spectral Doppler was utilized to evaluate flow at rest and with distal augmentation maneuvers in the common femoral, femoral and  popliteal veins. COMPARISON:  None Available. FINDINGS: Contralateral Common Femoral Vein: Respiratory phasicity is normal and symmetric with the symptomatic side. No evidence of thrombus. Normal compressibility. Common Femoral Vein: Nonocclusive thrombus present within the distal aspect of the common femoral vein approaching the saphenofemoral junction. Saphenofemoral Junction: The saphenofemoral junction itself is normally patent. Profunda Femoral Vein: Nonocclusive thrombus near the origin of the profunda femoral vein but no thrombus within the profunda femoral vein itself. Femoral Vein: Essentially occlusive thrombus within the right femoral vein with venous distension. A small segment of the proximal femoral vein demonstrates some partial flow. Popliteal Vein: Occlusive thrombus in the right popliteal vein. Calf Veins: Thrombus visualized in segments of posterior tibial and peroneal veins as well as likely gastrocnemius vein. Superficial Great Saphenous Vein: No evidence of thrombus. Normal compressibility. Venous Reflux:  None. Other Findings: No evidence of superficial thrombophlebitis or abnormal fluid collection. IMPRESSION: Extensive right lower extremity deep venous thrombosis involving the common femoral, femoral popliteal, posterior tibial, peroneal and gastrocnemius veins. Electronically Signed   By: Marcey Moan M.D.   On: 06/08/2024 09:20     Procedures   Medications Ordered in the ED  Rivaroxaban (XARELTO) tablet 15 mg (has no administration in time range)    Followed by  rivaroxaban (XARELTO) tablet 20 mg (has no administration in time range)  iohexol  (OMNIPAQUE ) 350 MG/ML injection 75 mL (75 mLs Intravenous Contrast Given 06/08/24 1019)                                    Medical Decision Making Amount and/or Complexity of Data Reviewed Labs: ordered. Radiology: ordered.  Risk Prescription drug management.   This patient presents to the ED for concern of leg pain, this  involves an extensive number of treatment options, and is a complaint that carries with it a high risk of complications and morbidity.  The differential diagnosis includes dvt, pe   Co morbidities that complicate the patient evaluation  anxiety   Additional history obtained:  Additional history obtained from epic chart review External records from outside source obtained and reviewed including family   Lab Tests:  I Ordered, and personally interpreted labs.  The pertinent results include:  cbc with sl elevated wbc at 11.9; cmp nl; trop nl   Imaging Studies ordered:  I ordered imaging studies including cta chest  I independently visualized and interpreted imaging which showed  There is small to medium volume acute pulmonary embolism in the  right-sided pulmonary artery branches, as  described above. No right  heart strain or lung infarction.  2. No lung mass, consolidation, pleural effusion or pneumothorax.  3. Otherwise essentially unremarkable exam, as described above.   I agree with the radiologist interpretation   Cardiac Monitoring:  The patient was maintained on a cardiac monitor.  I personally viewed and interpreted the cardiac monitored which showed an underlying rhythm of: nsr   Medicines ordered and prescription drug management:  I ordered medication including xarelto  for dvt/pe  Reevaluation of the patient after these medicines showed that the patient improved I have reviewed the patients home medicines and have made adjustments as needed   Test Considered:  ct   Problem List / ED Course:  DVT/PE:  pt is not hypoxic or sob.  His PE quantity is small.  Hypercoagulable panel sent. He is stable for d/c.  He is to f/u with DVT clinic.  He is to return if worse.  Also f/u with pcp.   Reevaluation:  After the interventions noted above, I reevaluated the patient and found that they have :improved   Social Determinants of Health:  Lives at  home   Dispostion:  After consideration of the diagnostic results and the patients response to treatment, I feel that the patent would benefit from discharge with outpatient f/u.       Final diagnoses:  Multiple subsegmental pulmonary emboli without acute cor pulmonale (HCC)  Acute deep vein thrombosis (DVT) of other specified vein of right lower extremity [facility])    ED Discharge Orders          Ordered    RIVAROXABAN (XARELTO) VTE STARTER PACK (15 & 20 MG)  Multiple Frequencies        06/08/24 1137    AMB Referral to Deep Vein Thrombosis Clinic        06/08/24 1139               Dean Clarity, MD 06/08/24 1535

## 2024-06-08 NOTE — Discharge Instructions (Addendum)
Information on my medicine - XARELTO (rivaroxaban)  This medication education was reviewed with me or my healthcare representative as part of my discharge preparation.  The pharmacist that spoke with me during my hospital stay was:  Ramond Craver, [place]? Xarelto was prescribed to treat blood clots that may have been found in the veins of your legs (deep vein thrombosis) or in your lungs (pulmonary embolism) and to reduce the risk of them occurring again.  What do you need to know about Xarelto? The starting dose is one 15 mg tablet taken TWICE daily with food for the FIRST 21 DAYS then the dose is changed to one 20 mg tablet taken ONCE A DAY with your evening meal.  DO NOT stop taking Xarelto without talking to the health care provider who prescribed the medication.  Refill your prescription for 20 mg tablets before you run out.  After discharge, you should have regular check-up appointments with your healthcare provider that is prescribing your Xarelto.  In the future your dose may need to be changed if your kidney function changes by a significant amount.  What do you do if you miss a dose? If you are taking Xarelto TWICE DAILY and you miss a dose, take it as soon as you remember. You may take two 15 mg tablets (total 30 mg) at the same time then resume your regularly scheduled 15 mg twice daily the next day.  If you are taking Xarelto ONCE DAILY and you miss a dose, take it as soon as you remember on the same day then continue your regularly scheduled once daily regimen the next day. Do not take two doses of Xarelto at the same time.   Important Safety Information Xarelto is a blood thinner medicine that can cause bleeding. You should call your healthcare provider right away if you experience any of the following: ? Bleeding from an injury or your nose that does not stop. ? Unusual colored urine (red or dark brown) or unusual colored stools (red  or black). ? Unusual bruising for unknown reasons. ? A serious fall or if you hit your head (even if there is no bleeding).  Some medicines may interact with Xarelto and might increase your risk of bleeding while on Xarelto. To help avoid this, consult your healthcare provider or pharmacist prior to using any new prescription or non-prescription medications, including herbals, vitamins, non-steroidal anti-inflammatory drugs (NSAIDs) and supplements.  This website has more information on Xarelto: [email or web address]

## 2024-06-08 NOTE — ED Provider Notes (Signed)
  [place] EMERGENCY DEPARTMENT AT [facility] Provider Note   CSN: [id number] Arrival date & time: 06/07/24  2201     Patient presents with: Leg Pain   Mario Meyer is a 63 y.o. male.   The history is provided by the patient.  Leg Pain He has no significant past history and comes in complaining of pain and swelling of his right lower leg which started yesterday.  He denies any trauma or unusual activity.  He denies chest pain and shortness of breath.   Prior to Admission medications   Medication Sig Start Date End Date Taking? Authorizing Provider  ALPRAZolam  (XANAX ) 0.5 MG tablet 1/2 to 1 bid prn anxiety use infrequently caution drowsiness 05/17/24   Alphonsa Glendia LABOR, MD  DULoxetine (CYMBALTA) 30 MG capsule Take 1 capsule (30 mg total) by mouth daily. 06/04/24   Alphonsa Glendia LABOR, MD  EPINEPHrine  0.3 mg/0.3 mL IJ SOAJ injection Inject 0.3 mg into the muscle as needed for anaphylaxis. 08/10/23   Alphonsa Glendia LABOR, MD  hydrOXYzine (VISTARIL) 25 MG capsule Take 1 capsule (25 mg total) by mouth every 8 (eight) hours as needed. 06/04/24   Alphonsa Glendia LABOR, MD  ibuprofen  (ADVIL ) 600 MG tablet Take 1 tablet (600 mg total) by mouth every 8 (eight) hours as needed. 04/05/24   Alphonsa Glendia LABOR, MD    Allergies: Shellfish allergy    Review of Systems  All other systems reviewed and are negative.   Updated Vital Signs BP 120/72   Pulse 66   Temp 98 F (36.7 C)   Resp 17   Ht 6' 1 (1.854 m)   Wt 88 kg   SpO2 94%   BMI 25.60 kg/m   Physical Exam Vitals and nursing note reviewed.   63 year old male, resting comfortably and in no acute distress. Vital signs are normal. Oxygen saturation is 94%, which is normal. Head is normocephalic and atraumatic. PERRLA, EOMI.  Lungs are clear without rales, wheezes, or rhonchi. Chest is nontender. Heart has regular rate and rhythm without murmur. Abdomen is soft, flat, nontender. Extremities: There is swelling and tenderness of the right  calf.  Right calf circumference is 2 cm greater than left calf circumference.  [place]' sign is negative. Skin is warm and dry without rash. Neurologic: Awake and alert, moves all extremities equally.   Procedures   Medications Ordered in the ED  Rivaroxaban (XARELTO) tablet 15 mg (has no administration in time range)                                    Medical Decision Making  Right calf pain.  Differential diagnosis includes, but is not limited to, DVT, ruptured Baker's cyst, muscle strain.  No history of injury, some muscle strain is very unlikely.  I have ordered an outpatient ultrasound to look for evidence of DVT and I have ordered an initial dose of rivaroxaban.  He is to return in the morning for the ultrasound.     Final diagnoses:  Pain and swelling of right lower leg    ED Discharge Orders     None          Raford Lenis, MD 06/08/24 [phone]

## 2024-06-09 ENCOUNTER — Other Ambulatory Visit: Payer: Self-pay

## 2024-06-09 ENCOUNTER — Encounter (HOSPITAL_COMMUNITY): Payer: Self-pay | Admitting: Emergency Medicine

## 2024-06-09 ENCOUNTER — Emergency Department (HOSPITAL_COMMUNITY)
Admission: EM | Admit: 2024-06-09 | Discharge: 2024-06-09 | Disposition: A | Discharge status: Home / Self Care | Attending: Emergency Medicine | Admitting: Emergency Medicine

## 2024-06-09 DIAGNOSIS — Z7901 Long term (current) use of anticoagulants: Secondary | ICD-10-CM | POA: Insufficient documentation

## 2024-06-09 DIAGNOSIS — M79661 Pain in right lower leg: Secondary | ICD-10-CM | POA: Insufficient documentation

## 2024-06-09 LAB — COMPREHENSIVE METABOLIC PANEL WITH GFR
ALT: 30 U/L (ref 0–44)
AST: 21 U/L (ref 15–41)
Albumin: 3 g/dL — ABNORMAL LOW (ref 3.5–5.0)
Alkaline Phosphatase: 62 U/L (ref 38–126)
Anion gap: 7 (ref 5–15)
BUN: 11 mg/dL (ref 8–23)
CO2: 27 mmol/L (ref 22–32)
Calcium: 8.7 mg/dL — ABNORMAL LOW (ref 8.9–10.3)
Chloride: 103 mmol/L (ref 98–111)
Creatinine, Ser: 0.74 mg/dL (ref 0.61–1.24)
GFR, Estimated: >60 mL/min (ref >60)
Glucose, Bld: 128 mg/dL — ABNORMAL HIGH (ref 70–99)
Potassium: 3.7 mmol/L (ref 3.5–5.1)
Sodium: 137 mmol/L (ref 135–145)
Total Bilirubin: 0.7 mg/dL (ref 0.0–1.2)
Total Protein: 6.7 g/dL (ref 6.5–8.1)

## 2024-06-09 LAB — CBC WITH DIFFERENTIAL/PLATELET
Abs Immature Granulocytes: 0.04 K/uL (ref 0.00–0.07)
Basophils Absolute: 0.1 K/uL (ref 0.0–0.1)
Basophils Relative: 0 %
Eosinophils Absolute: 0.7 K/uL — ABNORMAL HIGH (ref 0.0–0.5)
Eosinophils Relative: 6 %
HCT: 46.7 % (ref 39.0–52.0)
Hemoglobin: 15.5 g/dL (ref 13.0–17.0)
Immature Granulocytes: 0 %
Lymphocytes Relative: 17 %
Lymphs Abs: 2.1 K/uL (ref 0.7–4.0)
MCH: 28.2 pg (ref 26.0–34.0)
MCHC: 33.2 g/dL (ref 30.0–36.0)
MCV: 85.1 fL (ref 80.0–100.0)
Monocytes Absolute: 1.2 K/uL — ABNORMAL HIGH (ref 0.1–1.0)
Monocytes Relative: 10 %
Neutro Abs: 8.2 K/uL — ABNORMAL HIGH (ref 1.7–7.7)
Neutrophils Relative %: 67 %
Platelets: 385 K/uL (ref 150–400)
RBC: 5.49 MIL/uL (ref 4.22–5.81)
RDW: 12.6 % (ref 11.5–15.5)
WBC: 12.4 K/uL — ABNORMAL HIGH (ref 4.0–10.5)
nRBC: 0 % (ref 0.0–0.2)

## 2024-06-09 LAB — HOMOCYSTEINE: Homocysteine: 12.9 umol/L (ref 0.0–17.2)

## 2024-06-09 LAB — MAGNESIUM: Magnesium: 2.1 mg/dL (ref 1.7–2.4)

## 2024-06-09 MED ORDER — LIDOCAINE 5 % EX PTCH
1.0000 | MEDICATED_PATCH | CUTANEOUS | 0 refills | Status: AC
Start: 2024-06-09 — End: ?

## 2024-06-09 MED ORDER — ACETAMINOPHEN 500 MG PO TABS
1000.0000 mg | ORAL_TABLET | Freq: Once | ORAL | Status: AC
  Administered 2024-06-09: 1000 mg via ORAL
  Filled 2024-06-09: qty 2

## 2024-06-09 MED ORDER — OXYCODONE HCL 5 MG PO TABS: 5.0000 mg | ORAL_TABLET | Freq: Four times a day (QID) | ORAL | 0 refills | Status: DC | PRN

## 2024-06-09 MED ORDER — OXYCODONE HCL 5 MG PO TABS
5.0000 mg | ORAL_TABLET | Freq: Once | ORAL | Status: AC
  Administered 2024-06-09: 5 mg via ORAL
  Filled 2024-06-09: qty 1

## 2024-06-09 NOTE — ED Triage Notes (Signed)
 Pt seen and treated recently for DVT to R calf. States pain is worse and was told to come back if pain got worse.

## 2024-06-09 NOTE — ED Notes (Signed)
 Pt/family received d/c paperwork at this time. After going over the paperwork any questions, comments, or concerns were answered to the best of this nurse's knowledge. The pt/family verbally acknowledged the teachings/instructions.

## 2024-06-09 NOTE — ED Notes (Signed)
Patient given drink at this time.

## 2024-06-09 NOTE — Discharge Instructions (Signed)
 We evaluated you for your calf pain.  Your pain and spasms are likely due to your DVT.  Your blood testing was reassuring.  Since you have not been taking anything for pain, please start with the 1000 mg of Tylenol  every 6 hours.  You can also try lidocaine  patches.   We have given you a small amount of oxycodone  which you can take for pain not relieved by Tylenol .  Do not drink alcohol, drive, or operate machinery when taking this medicine.  Schedule a follow-up with the vascular surgeons.  If you have any new or worsening symptoms such as increasing swelling in your leg, severe uncontrolled pain, color change in your leg, or any other new symptoms like chest pain or difficulty breathing, then please return immediately to the ER.

## 2024-06-09 NOTE — ED Provider Notes (Signed)
 [place] EMERGENCY DEPARTMENT AT [facility] Provider Note  CSN: [id number] Arrival date & time: 06/09/24 2019  Chief Complaint(s) Leg Pain  HPI Mario Meyer is a 63 y.o. male history of recent diagnosis of DVT presenting to the emergency department with right leg pain.  Was seen at the ER yesterday after diagnosed with DVT.  Started on a blood thinner.  Has been taking this.  Plans to follow-up with the vascular surgeon but has not done so just yet.  Also had a CT scan performed that shows small PE.  Other cardiac testing was negative.  Primarily came in today because he was having pain in the right calf.  He reports that he was told not to take anything for pain so he has not taken anything at all.  He also has noticed some spasming of his muscle in the calf.  Denies chest pain, shortness of breath.   Past Medical History Past Medical History:  Diagnosis Date   Panic attack    Patient Active Problem List   Diagnosis Date Noted   Anxiety 10/14/2022   Chronic pain syndrome 10/14/2022   Insomnia 10/14/2022   Shoulder joint pain 10/14/2022   BPH associated with nocturia 09/18/2022   S/P shoulder surgery right biceps tenodesis 07-23-20 08/19/2020   Tendonitis of upper biceps tendon of left shoulder    Status post right rotator cuff repair// MUA 04/16/20 04/24/2020   Arthrofibrosis of right shoulder    Adhesive capsulitis of right shoulder 03/21/2020   S/P arthroscopy of right shoulder  biceps tenodesis 08/31/19 12/07/2019   Nontraumatic incomplete tear of right rotator cuff    DOE (dyspnea on exertion)    Abnormal nuclear stress test    Hyperlipidemia 10/04/2016   History of colonic polyps    Home Medication(s) Prior to Admission medications   Medication Sig Start Date End Date Taking? Authorizing Provider  lidocaine  (LIDODERM ) 5 % Place 1 patch onto the skin daily. Remove & Discard patch within 12 hours or as directed by MD 06/09/24  Yes Francesca Elsie CROME, MD   oxyCODONE  (ROXICODONE ) 5 MG immediate release tablet Take 1 tablet (5 mg total) by mouth every 6 (six) hours as needed for severe pain (pain score 7-10). 06/09/24  Yes Francesca Elsie CROME, MD  ALPRAZolam  (XANAX ) 0.5 MG tablet 1/2 to 1 bid prn anxiety use infrequently caution drowsiness 05/17/24   Alphonsa Glendia LABOR, MD  DULoxetine  (CYMBALTA ) 30 MG capsule Take 1 capsule (30 mg total) by mouth daily. 06/04/24   Alphonsa Glendia LABOR, MD  EPINEPHrine  0.3 mg/0.3 mL IJ SOAJ injection Inject 0.3 mg into the muscle as needed for anaphylaxis. 08/10/23   Alphonsa Glendia LABOR, MD  hydrOXYzine  (VISTARIL ) 25 MG capsule Take 1 capsule (25 mg total) by mouth every 8 (eight) hours as needed. 06/04/24   Alphonsa Glendia LABOR, MD  ibuprofen  (ADVIL ) 600 MG tablet Take 1 tablet (600 mg total) by mouth every 8 (eight) hours as needed. 04/05/24   Alphonsa Glendia LABOR, MD  RIVAROXABAN  (XARELTO ) VTE STARTER PACK (15 & 20 MG) Take 15 mg by mouth 2 (two) times daily for 21 days, THEN 20 mg daily for 21 days. Take with food. 06/08/24 07/20/24  Dean Clarity, MD  Past Surgical History Past Surgical History:  Procedure Laterality Date   BICEPT TENODESIS Right 07/23/2020   Procedure: RIGHT SHOULDER OPEN BICEPS TENODESIS;  Surgeon: Margrette Taft BRAVO, MD;  Location: [facility];  Service: Orthopedics;  Laterality: Right;   COLONOSCOPY N/A 10/04/2015   Procedure: COLONOSCOPY;  Surgeon: Lamar CHRISTELLA Hollingshead, MD;  Location: [facility];  Service: Endoscopy;  Laterality: N/A;  7:30 AM   KNEE ARTHROSCOPY W/ ACL RECONSTRUCTION Right    RIGHT/LEFT HEART CATH AND CORONARY ANGIOGRAPHY N/A 02/23/2019   Procedure: RIGHT/LEFT HEART CATH AND CORONARY ANGIOGRAPHY;  Surgeon: Claudene Victory ORN, MD;  Location: [facility];  Service: Cardiovascular;  Laterality: N/A;   rotator cuff surgery Right    SHOULDER ARTHROSCOPY WITH OPEN ROTATOR CUFF REPAIR  Right 08/31/2019   Procedure: RIGHT SHOULDER ARTHROSCOPY with open rotator cuff repair;  Surgeon: Margrette Taft BRAVO, MD;  Location: [facility];  Service: Orthopedics;  Laterality: Right;   SHOULDER CLOSED REDUCTION Right 04/16/2020   Procedure: CLOSED MANIPULATION RIGHT SHOULDER;  Surgeon: Margrette Taft BRAVO, MD;  Location: [facility];  Service: Orthopedics;  Laterality: Right;   Family History Family History  Problem Relation Age of Onset   Cancer Mother     Social History Social History   Tobacco Use   Smoking status: Never   Smokeless tobacco: Never  Vaping Use   Vaping status: Never Used  Substance Use Topics   Alcohol use: No   Drug use: No   Allergies Shellfish allergy  Review of Systems Review of Systems  All other systems reviewed and are negative.   Physical Exam Vital Signs  I have reviewed the triage vital signs BP (!) 143/81 (BP Location: Right Arm)   Pulse 68   Temp 98.3 F (36.8 C) (Oral)   Resp 20   Ht 6' 1 (1.854 m)   Wt 88 kg   SpO2 97%   BMI 25.60 kg/m  Physical Exam Vitals and nursing note reviewed.  Constitutional:      General: He is not in acute distress.    Appearance: Normal appearance.  HENT:     Mouth/Throat:     Mouth: Mucous membranes are moist.  Eyes:     Conjunctiva/sclera: Conjunctivae normal.  Cardiovascular:     Rate and Rhythm: Normal rate and regular rhythm.  Pulmonary:     Effort: Pulmonary effort is normal. No respiratory distress.     Breath sounds: Normal breath sounds.  Abdominal:     General: Abdomen is flat.     Palpations: Abdomen is soft.     Tenderness: There is no abdominal tenderness.  Musculoskeletal:     Left lower leg: No edema.     Comments: Possible trace right calf edema, mild tenderness.  Occasional calf muscle fasciculation.  2+ distal pulse.  No significant edema or discoloration of the leg.  Less than 2-second capillary refill.  Skin:    General: Skin is warm and dry.     Capillary Refill: Capillary  refill takes less than 2 seconds.  Neurological:     Mental Status: He is alert and oriented to person, place, and time. Mental status is at baseline.  Psychiatric:        Mood and Affect: Mood normal.        Behavior: Behavior normal.     ED Results and Treatments Labs (all labs ordered are listed, but only abnormal results are displayed) Labs Reviewed  COMPREHENSIVE METABOLIC PANEL WITH GFR - Abnormal; Notable for the  following components:      Result Value   Glucose, Bld 128 (*)    Calcium  8.7 (*)    Albumin 3.0 (*)    All other components within normal limits  CBC WITH DIFFERENTIAL/PLATELET - Abnormal; Notable for the following components:   WBC 12.4 (*)    Neutro Abs 8.2 (*)    Monocytes Absolute 1.2 (*)    Eosinophils Absolute 0.7 (*)    All other components within normal limits  MAGNESIUM                                                                                                                          Radiology No results found.  Pertinent labs & imaging results that were available during my care of the patient were reviewed by me and considered in my medical decision making (see MDM for details).  Medications Ordered in ED Medications  acetaminophen  (TYLENOL ) tablet 1,000 mg (1,000 mg Oral Given 06/09/24 2140)  oxyCODONE  (Oxy IR/ROXICODONE ) immediate release tablet 5 mg (5 mg Oral Given 06/09/24 2140)                                                                                                                                     Procedures Procedures  (including critical care time)  Medical Decision Making / ED Course   MDM:  63 year old presenting with leg pain.  Patient was recently diagnosed with extensive DVT.  He has been compliant with his anticoagulation.  He is not taking anything for his pain.  Discussed pain control with the patient.  Recommended Tylenol , lidocaine  patches.  Give small amount of oxycodone  as needed.  Reviewed PDMP and safe  use of this medicine.  Do not think there is any indication for further imaging at this time.  No signs of alba dolens, arterial occlusion, cellulitis, or other dangerous process.  Also having some fasciculations in the calf.  Suspect this is related to his DVT but electrolytes are reassuring.  Patient was diagnosed with PE yesterday but continues to deny any chest pain or shortness of breath.  His vital signs here are reassuring with no tachycardia or hypoxia.  I do not think he needs any further cardiac testing today but was instructed to return should he develop any chest pain or shortness of breath.  Will discharge patient to home. All questions answered. Patient comfortable with plan of  discharge. Return precautions discussed with patient and specified on the after visit summary.       Additional history obtained: -Additional history obtained from family -External records from outside source obtained and reviewed including: Chart review including previous notes, labs, imaging, consultation notes including recent ER notes    Lab Tests: -I ordered, reviewed, and interpreted labs.   The pertinent results include:   Labs Reviewed  COMPREHENSIVE METABOLIC PANEL WITH GFR - Abnormal; Notable for the following components:      Result Value   Glucose, Bld 128 (*)    Calcium  8.7 (*)    Albumin 3.0 (*)    All other components within normal limits  CBC WITH DIFFERENTIAL/PLATELET - Abnormal; Notable for the following components:   WBC 12.4 (*)    Neutro Abs 8.2 (*)    Monocytes Absolute 1.2 (*)    Eosinophils Absolute 0.7 (*)    All other components within normal limits  MAGNESIUM    Notable for mild leukocytosis    Medicines ordered and prescription drug management: Meds ordered this encounter  Medications   acetaminophen  (TYLENOL ) tablet 1,000 mg   oxyCODONE  (Oxy IR/ROXICODONE ) immediate release tablet 5 mg    Refill:  0   oxyCODONE  (ROXICODONE ) 5 MG immediate release tablet     Sig: Take 1 tablet (5 mg total) by mouth every 6 (six) hours as needed for severe pain (pain score 7-10).    Dispense:  12 tablet    Refill:  0   lidocaine  (LIDODERM ) 5 %    Sig: Place 1 patch onto the skin daily. Remove & Discard patch within 12 hours or as directed by MD    Dispense:  30 patch    Refill:  0    -I have reviewed the patients home medicines and have made adjustments as needed   Reevaluation: After the interventions noted above, I reevaluated the patient and found that their symptoms have improved  Co morbidities that complicate the patient evaluation  Past Medical History:  Diagnosis Date   Panic attack       Dispostion: Disposition decision including need for hospitalization was considered, and patient discharged from emergency department.    Final Clinical Impression(s) / ED Diagnoses Final diagnoses:  Right calf pain     This chart was dictated using voice recognition software.  Despite best efforts to proofread,  errors can occur which can change the documentation meaning.    Francesca Elsie CROME, MD 06/09/24 [phone]

## 2024-06-10 LAB — BETA-2-GLYCOPROTEIN I ABS, IGG/M/A
Beta-2 Glyco I IgG: <9 GPI IgG units (ref 0–20)
Beta-2-Glycoprotein I IgA: <9 GPI IgA units (ref 0–25)
Beta-2-Glycoprotein I IgM: <9 GPI IgM units (ref 0–32)

## 2024-06-10 LAB — PROTEIN C, TOTAL: Protein C, Total: 82 % (ref 60–150)

## 2024-06-11 LAB — CARDIOLIPIN ANTIBODIES, IGG, IGM, IGA
Anticardiolipin IgA: <9 U/mL (ref 0–11)
Anticardiolipin IgG: <9 GPL U/mL (ref 0–14)
Anticardiolipin IgM: <9 [MPL'U]/mL (ref 0–12)

## 2024-06-12 ENCOUNTER — Telehealth: Payer: Self-pay | Admitting: Family Medicine

## 2024-06-12 NOTE — Telephone Encounter (Signed)
 Nurses Patient was recently in the ER with pulmonary embolus They treated him with a blood thinner He needs to do a follow-up visit within 30 days with us -preferably within the next 2 weeks Please set him up with myself or with Elveria if I am not available Same-day slot would be okay Please communicate with patient and move forward with the appointment thank you

## 2024-06-13 ENCOUNTER — Ambulatory Visit: Attending: Vascular Surgery | Admitting: Student-PharmD

## 2024-06-13 ENCOUNTER — Other Ambulatory Visit (HOSPITAL_COMMUNITY): Payer: Self-pay

## 2024-06-13 ENCOUNTER — Encounter: Payer: Self-pay | Admitting: Student-PharmD

## 2024-06-13 ENCOUNTER — Other Ambulatory Visit: Payer: Self-pay

## 2024-06-13 ENCOUNTER — Encounter: Payer: Self-pay | Admitting: Pharmacist

## 2024-06-13 VITALS — BP 108/65 | HR 65 | Wt 185.5 lb

## 2024-06-13 DIAGNOSIS — I82411 Acute embolism and thrombosis of right femoral vein: Secondary | ICD-10-CM | POA: Insufficient documentation

## 2024-06-13 LAB — FACTOR 5 LEIDEN

## 2024-06-13 LAB — LUPUS ANTICOAGULANT PANEL
DRVVT: 138.7 s — ABNORMAL HIGH (ref 0.0–47.0)
PTT Lupus Anticoagulant: 54.3 s — ABNORMAL HIGH (ref 0.0–43.5)

## 2024-06-13 LAB — PTT-LA MIX: PTT-LA Mix: 48.2 s — ABNORMAL HIGH (ref 0.0–40.5)

## 2024-06-13 LAB — PROTEIN S, TOTAL: Protein S Ag, Total: 141 % (ref 60–150)

## 2024-06-13 LAB — DRVVT CONFIRM: dRVVT Confirm: 1.6 ratio — ABNORMAL HIGH (ref 0.8–1.2)

## 2024-06-13 LAB — PROTEIN C ACTIVITY: Protein C Activity: 90 % (ref 73–180)

## 2024-06-13 LAB — HEXAGONAL PHASE PHOSPHOLIPID: Hexagonal Phase Phospholipid: 7 s (ref 0–11)

## 2024-06-13 LAB — PROTEIN S ACTIVITY: Protein S Activity: 108 % (ref 63–140)

## 2024-06-13 LAB — DRVVT MIX: dRVVT Mix: 89.2 s — ABNORMAL HIGH (ref 0.0–40.4)

## 2024-06-13 MED ORDER — RIVAROXABAN 20 MG PO TABS
20.0000 mg | ORAL_TABLET | Freq: Every day | ORAL | 1 refills | Status: DC
  Filled 2024-06-13: qty 90, 90d supply, fill #0

## 2024-06-13 NOTE — Patient Instructions (Addendum)
-  Continue rivaroxaban  (Xarelto ) 15 mg twice daily with food for 21 days followed by 20 mg daily with food. -Your refills have been sent to [facility], which is our pharmacy that delivers to your home for free. You may need to call the pharmacy to ask them to fill this when you start to run low on your current supply.   To get access to the coupon: You will need this to make the cost $10 per 3 month supply (compared to $478 on your insurance). -Please call your XARELTO  withMe at [email or web address]-[email or web address]  [phone]), Monday - Friday, 8:00 AM - 8:00 PM ET  to continue with the Savings Program enrollment. Once you have the coupon, give me a call at [phone] so we can apply this to your refill.   -It is important to take your medication around the same time every day.  -Avoid NSAIDs like ibuprofen  (Advil , Motrin ) and naproxen  (Aleve ) as well as aspirin  doses over 100 mg daily. -Tylenol  (acetaminophen ) is the preferred over the counter pain medication to lower the risk of bleeding. -Be sure to alert all of your health care providers that you are taking an anticoagulant prior to starting a new medication or having a procedure. -Monitor for signs and symptoms of bleeding (abnormal bruising, prolonged bleeding, nose bleeds, bleeding from gums, discolored urine, black tarry stools). If you have fallen and hit your head OR if your bleeding is severe or not stopping, seek emergency care.  -Go to the emergency room if emergent signs and symptoms of new clot occur (new or worse swelling and pain in an arm or leg, shortness of breath, chest pain, fast or irregular heartbeats, lightheadedness, dizziness, fainting, coughing up blood) or if you experience a significant color change (pale or blue) in the extremity that has the DVT.  -We recommend you wear compression stockings (20-30 mmHg) as long as you are having swelling or pain. Be sure to purchase the correct size and take them off at night.   If you have any questions  or need to reschedule an appointment, please call [phone]. If you are having an emergency, call 911 or present to the nearest emergency room.   What is a DVT?  -Deep vein thrombosis (DVT) is a condition in which a blood clot forms in a vein of the deep venous system which can occur in the lower leg, thigh, pelvis, arm, or neck. This condition is serious and can be life-threatening if the clot travels to the arteries of the lungs and causing a blockage (pulmonary embolism, PE). A DVT can also damage veins in the leg, which can lead to long-term venous disease, leg pain, swelling, discoloration, and ulcers or sores (post-thrombotic syndrome).  -Treatment may include taking an anticoagulant medication to prevent more clots from forming and the current clot from growing, wearing compression stockings, and/or surgical procedures to remove or dissolve the clot.

## 2024-06-13 NOTE — Progress Notes (Signed)
 DVT Clinic Note  Name: Mario Meyer     MRN: [id number]     DOB: [birth year]     Sex: male  PCP: Alphonsa Glendia LABOR, MD  Today's Visit: Visit Information: Initial Visit  Referred to DVT Clinic by: Emergency Department - Dr. Dean Referred to CPP by: Dr. Magda Reason for referral:  Chief Complaint  Patient presents with   Med Management - DVT   HISTORY OF PRESENT ILLNESS: Mario Meyer is a 63 y.o. male who presents after diagnosis of DVT for medication management. Patient initially presented to the ED 06/07/24 reporting RLE pain and swelling that started the day prior. He returned the following morning for ultrasound which was positive for extensive RLE DVT. CTA showed acute PE without right heart strain. He was discharged on Xarelto . Denies any history of DVT. No known VTE history in his family. Reports that symptoms came on overnight. He is very active, lives [place]. When his pain started he also started feeling very exhausted. Denies chest pain, SOB. Has lost 40 lbs in the last few months, used to be around 225 lb. Has woken up over the last few weeks with night sweats. Reports he is up to date on cancer screenings including colonoscopy, PSA checks. Sits with his dad who had a stroke but is still moving throughout the day when he does that. He is ambulating independently today and accompanied by his son.   Positive Thrombotic Risk Factors: None Present Bleeding Risk Factors: Anticoagulant therapy  Negative Thrombotic Risk Factors: Previous VTE, Recent surgery (within 3 months), Recent trauma (within 3 months), Recent admission to hospital with acute illness (within 3 months), Paralysis, paresis, or recent plaster cast immobilization of lower extremity, Central venous catheterization, Bed rest >72 hours within 3 months, Sedentary journey lasting >8 hours within 4 weeks, Pregnancy, Within 6 weeks postpartum, Recent cesarean section (within 3 months), Estrogen therapy, Testosterone  therapy, Erythropoiesis-stimulating agent, Recent COVID diagnosis (within 3 months), Active cancer, Non-malignant, chronic inflammatory condition, Known thrombophilic condition, Smoking, Obesity, Older age  Rx Insurance Coverage: Commercial Rx Affordability: Xarelto  is $173.99 for 30 days or $477.97 for 90 days. Used one time free card to fill starter pack. Will use copay card to reduce refills to $10/month. Rx Assistance Provided:  Free 30-day trial card Co-pay card Preferred Pharmacy: [facility] for free home delivery per patient preference  Past Medical History:  Diagnosis Date   Panic attack     Past Surgical History:  Procedure Laterality Date   BICEPT TENODESIS Right 07/23/2020   Procedure: RIGHT SHOULDER OPEN BICEPS TENODESIS;  Surgeon: Margrette Taft BRAVO, MD;  Location: [facility];  Service: Orthopedics;  Laterality: Right;   COLONOSCOPY N/A 10/04/2015   Procedure: COLONOSCOPY;  Surgeon: Lamar CHRISTELLA Hollingshead, MD;  Location: [facility];  Service: Endoscopy;  Laterality: N/A;  7:30 AM   KNEE ARTHROSCOPY W/ ACL RECONSTRUCTION Right    RIGHT/LEFT HEART CATH AND CORONARY ANGIOGRAPHY N/A 02/23/2019   Procedure: RIGHT/LEFT HEART CATH AND CORONARY ANGIOGRAPHY;  Surgeon: Claudene Victory LELON, MD;  Location: [facility];  Service: Cardiovascular;  Laterality: N/A;   rotator cuff surgery Right    SHOULDER ARTHROSCOPY WITH OPEN ROTATOR CUFF REPAIR Right 08/31/2019   Procedure: RIGHT SHOULDER ARTHROSCOPY with open rotator cuff repair;  Surgeon: Margrette Taft BRAVO, MD;  Location: [facility];  Service: Orthopedics;  Laterality: Right;   SHOULDER CLOSED REDUCTION Right 04/16/2020   Procedure: CLOSED MANIPULATION RIGHT SHOULDER;  Surgeon: Margrette Taft BRAVO, MD;  Location: [facility];  Service: Orthopedics;  Laterality: Right;    Social History   Socioeconomic History   Marital status: Married    Spouse name: Not on file   Number of children: Not on file   Years of education: Not on  file   Highest education level: Not on file  Occupational History   Not on file  Tobacco Use   Smoking status: Never   Smokeless tobacco: Never  Vaping Use   Vaping status: Never Used  Substance and Sexual Activity   Alcohol use: No   Drug use: No   Sexual activity: Yes  Other Topics Concern   Not on file  Social History Narrative   Not on file   Social Drivers of Health   Financial Resource Strain: Not on file  Food Insecurity: Not on file  Transportation Needs: Not on file  Physical Activity: Not on file  Stress: Not on file  Social Connections: Not on file  Intimate Partner Violence: Not on file    Family History  Problem Relation Age of Onset   Cancer Mother     Allergies as of 06/13/2024 - Review Complete 06/13/2024  Allergen Reaction Noted   Shellfish allergy Anaphylaxis 08/10/2023    Current Outpatient Medications on File Prior to Visit  Medication Sig Dispense Refill   oxyCODONE  (ROXICODONE ) 5 MG immediate release tablet Take 1 tablet (5 mg total) by mouth every 6 (six) hours as needed for severe pain (pain score 7-10). 12 tablet 0   RIVAROXABAN  (XARELTO ) VTE STARTER PACK (15 & 20 MG) Take 15 mg by mouth 2 (two) times daily for 21 days, THEN 20 mg daily for 21 days. Take with food. 51 each 0   sildenafil  (REVATIO ) 20 MG tablet Take by mouth.     DULoxetine  (CYMBALTA ) 30 MG capsule Take 1 capsule (30 mg total) by mouth daily. 30 capsule 3   EPINEPHrine  0.3 mg/0.3 mL IJ SOAJ injection Inject 0.3 mg into the muscle as needed for anaphylaxis. 1 each 1   hydrOXYzine  (VISTARIL ) 25 MG capsule Take 1 capsule (25 mg total) by mouth every 8 (eight) hours as needed. 30 capsule 0   lidocaine  (LIDODERM ) 5 % Place 1 patch onto the skin daily. Remove & Discard patch within 12 hours or as directed by MD 30 patch 0   No current facility-administered medications on file prior to visit.   REVIEW OF SYSTEMS:  Review of Systems  Respiratory:  Negative for shortness of breath.    Cardiovascular:  Positive for leg swelling. Negative for chest pain and palpitations.  Musculoskeletal:  Positive for myalgias.  Neurological:  Negative for dizziness and tingling.   PHYSICAL EXAMINATION:  Vitals:   06/13/24 1044  BP: 108/65  Pulse: 65  SpO2: 95%  Weight: 185 lb 8 oz (84.1 kg)    Body mass index is 24.47 kg/m.  Physical Exam Vitals reviewed.  Cardiovascular:     Rate and Rhythm: Normal rate.  Pulmonary:     Effort: Pulmonary effort is normal.  Musculoskeletal:        General: Tenderness present.     Right lower leg: Edema (1+) present.     Left lower leg: No edema.  Skin:    Findings: No bruising or erythema.  Psychiatric:        Mood and Affect: Mood normal.        Behavior: Behavior normal.        Thought Content:  Thought content normal.    Villalta Score for Post-Thrombotic Syndrome: Pain: Moderate Cramps: Absent Heaviness: Absent Paresthesia: Absent Pruritus: Absent Pretibial Edema: Mild Skin Induration: Absent Hyperpigmentation: Absent Redness: Absent Venous Ectasia: Absent Pain on calf compression: Mild Villalta Preliminary Score: 4 Is venous ulcer present?: No If venous ulcer is present and score is <15, then 15 points total are assigned: Absent Villalta Total Score: 4  LABS:  CBC     Component Value Date/Time   WBC 12.4 (H) 06/09/2024 2148   RBC 5.49 06/09/2024 2148   HGB 15.5 06/09/2024 2148   HGB 16.9 07/26/2023 0925   HCT 46.7 06/09/2024 2148   HCT 52.3 (H) 07/26/2023 0925   PLT 385 06/09/2024 2148   PLT 300 07/26/2023 0925   MCV 85.1 06/09/2024 2148   MCV 85 07/26/2023 0925   MCH 28.2 06/09/2024 2148   MCHC 33.2 06/09/2024 2148   RDW 12.6 06/09/2024 2148   RDW 13.4 07/26/2023 0925   LYMPHSABS 2.1 06/09/2024 2148   LYMPHSABS 1.9 07/26/2023 0925   MONOABS 1.2 (H) 06/09/2024 2148   EOSABS 0.7 (H) 06/09/2024 2148   EOSABS 0.3 07/26/2023 0925   BASOSABS 0.1 06/09/2024 2148   BASOSABS 0.1 07/26/2023 0925    Hepatic  Function      Component Value Date/Time   PROT 6.7 06/09/2024 2148   PROT 6.8 07/26/2023 0925   ALBUMIN 3.0 (L) 06/09/2024 2148   ALBUMIN 4.1 07/26/2023 0925   AST 21 06/09/2024 2148   ALT 30 06/09/2024 2148   ALKPHOS 62 06/09/2024 2148   BILITOT 0.7 06/09/2024 2148   BILITOT 0.4 07/26/2023 0925   BILIDIR 0.11 07/26/2023 0925    Renal Function   Lab Results  Component Value Date   CREATININE 0.74 06/09/2024   CREATININE 0.71 06/08/2024   CREATININE 0.88 07/26/2023    Estimated Creatinine Clearance: 108.2 mL/min (by C-G formula based on SCr of 0.74 mg/dL).   [facility] Vascular Lab Studies:  06/08/24 doppler  FINDINGS: Contralateral Common Femoral Vein: Respiratory phasicity is normal and symmetric with the symptomatic side. No evidence of thrombus. Normal compressibility.   Common Femoral Vein: Nonocclusive thrombus present within the distal aspect of the common femoral vein approaching the saphenofemoral junction.   Saphenofemoral Junction: The saphenofemoral junction itself is normally patent.   Profunda Femoral Vein: Nonocclusive thrombus near the origin of the profunda femoral vein but no thrombus within the profunda femoral vein itself.   Femoral Vein: Essentially occlusive thrombus within the right femoral vein with venous distension. A small segment of the proximal femoral vein demonstrates some partial flow.   Popliteal Vein: Occlusive thrombus in the right popliteal vein.   Calf Veins: Thrombus visualized in segments of posterior tibial and peroneal veins as well as likely gastrocnemius vein.   Superficial Great Saphenous Vein: No evidence of thrombus. Normal compressibility.   Venous Reflux:  None.   Other Findings: No evidence of superficial thrombophlebitis or abnormal fluid collection.   IMPRESSION: Extensive right lower extremity deep venous thrombosis involving the common femoral, femoral popliteal, posterior tibial, peroneal and gastrocnemius  veins.  06/08/24 CTA (PE) IMPRESSION: 1. There is small to medium volume acute pulmonary embolism in the right-sided pulmonary artery branches, as described above. No right heart strain or lung infarction. 2. No lung mass, consolidation, pleural effusion or pneumothorax. 3. Otherwise essentially unremarkable exam, as described above.  ASSESSMENT: Location of DVT: Right common femoral vein, Right femoral vein, Right popliteal vein, Right distal vein Cause of DVT: unprovoked  Patient  without prior history of DVT diagnosed with extensive RLE DVT and right sided PE without right heart strain. With DVT as proximal as the common femoral vein, discussed patient with Dr. Magda in clinic today. Given the thrombus in the CFV is nonocclusive and the patient is having no symptoms above the knee, would not be a candidate for vascular surgery intervention. Will continue anticoagulation with Xarelto . In light of no provoking risk factors identified today, will refer him to hematology for further work up. The ED drew a hypercoagulable panel that is in process. Given recent 40 lb weight loss and recent night sweats, would also benefit from malignancy workup. Will defer duration of anticoagulation to hematology. Working on helping patient access copay card to reduce cost of refills. Once able to resolve that, no barriers to medication adherence identified. Patient prefers to receive refills through the mail at no cost from our pharmacy. Counseled patient extensively on Xarelto . Encouraged compression and elevation for his swelling. All questions today have been answered.   PLAN: -Continue rivaroxaban  (Xarelto ) 15 mg twice daily with food for 21 days followed by 20 mg daily with food. -Expected duration of therapy: per hematology. Therapy started on 06/08/24. -Patient educated on purpose, proper use and potential adverse effects of rivaroxaban  (Xarelto ). -Discussed importance of taking medication around the same time  every day. -Advised patient of medications to avoid (NSAIDs, aspirin  doses >100 mg daily). -Educated that Tylenol  (acetaminophen ) is the preferred analgesic to lower the risk of bleeding. -Advised patient to alert all providers of anticoagulation therapy prior to starting a new medication or having a procedure. -Emphasized importance of monitoring for signs and symptoms of bleeding (abnormal bruising, prolonged bleeding, nose bleeds, bleeding from gums, discolored urine, black tarry stools). -Educated patient to present to the ED if emergent signs and symptoms of new thrombosis occur. -Counseled patient to wear compression stockings daily, removing at night. Counseled on proper leg elevation.   Follow up: Referred to hematology/oncology. DVT Clinic as needed.   Lum Herald, PharmD, [place], CPP Deep Vein Thrombosis Clinic Clinical Pharmacist Practitioner [phone]

## 2024-06-14 ENCOUNTER — Other Ambulatory Visit (HOSPITAL_COMMUNITY): Payer: Self-pay

## 2024-06-14 ENCOUNTER — Telehealth: Payer: Self-pay

## 2024-06-14 ENCOUNTER — Ambulatory Visit: Admitting: Family Medicine

## 2024-06-14 LAB — PROTHROMBIN GENE MUTATION

## 2024-06-14 NOTE — Telephone Encounter (Signed)
 Patient Advocate Encounter  Patient tried to enroll for Xarelto  [employer] savings card while in office. [employer] provided an error message that this patient may already be enrolled.  I have contacted [employer] to try to obtain billing information. [employer] was unable to provide me with the patients account information, though I was able to provide and confirm the patients current commercial coverage with the representative.  Representative offered her direct extension [phone] for the patient. She will provide the pt with account information, or help register for an account if there is not one already.  I have left a voicemail for the pt to reach out to [employer] or to me directly if there are further issues.  Rachel DEL, CPhT Rx Patient Advocate Phone: [phone]

## 2024-06-15 ENCOUNTER — Telehealth: Payer: Self-pay | Admitting: Student-PharmD

## 2024-06-15 NOTE — Telephone Encounter (Signed)
 Have had multiple calls with the patient today regarding his access to Xarelto . His copay on insurance is $174/month. He does have Medicare A/B but his drug insurance is a [employer] school [employer], therefore he qualifies to use copay cards. [employer]arel[employer]  has a card that reduces the cost to $10/month. I tried to get him set up for this in the clinic this week but because he had used their free card for the starter pack, they wouldn't give us  the card information for the pharmacy to run and required the patient to call. Our patient advocate Stef tried calling on his behalf yesterday and confirmed that he qualifies for the card but they would not give us  the information either, saying the patient needed to call for it. He has called multiple times since then and they will still not give him the information to access the program and are making him do multiple other steps that are unnecessary.   We could switch him from Xarelto  to Eliquis, which has the same $10 copay card but allows me to activate it for him. He is eager to switch from Xarelto  to Eliquis so that it will be more affordable and not require him to be on the phone with the drug company anymore. Will have him finish out the 30-day supply of the Xarelto  starter pack, then plan to switch to Eliquis 5 mg BID. I've discontinued the refills of Xarelto  that we sent previously. I will call the patient near the end of the month to discuss the transition to Eliquis, set him up with an activated copay card, and provide all of this information to our pharmacy so that it will be delivered to his home at no additional cost with plenty of time before he will run out of the Xarelto  starter pack. Patient confirms understanding of this plan.

## 2024-06-16 ENCOUNTER — Ambulatory Visit: Admitting: Orthopedic Surgery

## 2024-06-18 NOTE — Addendum Note (Signed)
 Addended by: ALPHONSA HAMILTON A on: 06/18/2024 05:08 PM   Modules accepted: Level of Service

## 2024-06-21 NOTE — Progress Notes (Addendum)
 [facility]  [phone] [phone]  ID: Mario Meyer OB: 1961/09/07  MR#: [id number]  RDW#:[id number]  Patient Care Team: Alphonsa Glendia LABOR, MD as PCP - General (Family Medicine) Charls Pearla LABOR, MD (Inactive) as PCP - Cardiology (Cardiology) Shaaron Lamar HERO, MD as Consulting Physician (Gastroenterology)  CHIEF COMPLAINT: DVT  INTERVAL HISTORY: Patient is a 63 year old male who was seen in the emergency room on 06/09/2024 for right calf pain. CTA showed right sided PE without heart strain.  Right lower extremity Doppler showed extensive right lower DVT involving the femoral, popliteal, posterior tibial and peroneal veins.  He was started on Xarelto .  It does not appear it was provoked.  ED performed hypercoagulable workup which showed heterozygous factor 5 leiden and a positive lupus anticoagulant.  Beta-2  glycoprotein was negative.  Protein S and C along with Antithrombin III  were negative.  Cardiolipin antibody was negative.  Reports he is feeling well since the pain in his right leg is gone.  States he is currently on Xarelto  but will be switched to Eliquis at the end of the month due to cost.   He denies history of previous blood clot.  No family history of blood clots.  Reports his symptoms started overnight.  Reports his mom had colon cancer.  He is retired and sits with his dad who had a stroke.  Reports he likes to hunt and fish for fun.  [place]  Reports he is very active and feels well.  Appetite is 100% energy levels are 100%.  No Pain.  Reports he is up-to-date on cancer screenings including colonoscopy and PSA check.  Past Medical History:  Diagnosis Date   Panic attack     REVIEW OF SYSTEMS:   Review of Systems  All other systems reviewed and are negative.   As per HPI. Otherwise, a complete review of systems is negative.  PAST MEDICAL HISTORY: Past Medical History:  Diagnosis Date   Panic attack     PAST  SURGICAL HISTORY: Past Surgical History:  Procedure Laterality Date   BICEPT TENODESIS Right 07/23/2020   Procedure: RIGHT SHOULDER OPEN BICEPS TENODESIS;  Surgeon: Margrette Taft BRAVO, MD;  Location: [facility];  Service: Orthopedics;  Laterality: Right;   COLONOSCOPY N/A 10/04/2015   Procedure: COLONOSCOPY;  Surgeon: Lamar HERO Shaaron, MD;  Location: [facility];  Service: Endoscopy;  Laterality: N/A;  7:30 AM   KNEE ARTHROSCOPY W/ ACL RECONSTRUCTION Right    RIGHT/LEFT HEART CATH AND CORONARY ANGIOGRAPHY N/A 02/23/2019   Procedure: RIGHT/LEFT HEART CATH AND CORONARY ANGIOGRAPHY;  Surgeon: Claudene Victory LELON, MD;  Location: [facility];  Service: Cardiovascular;  Laterality: N/A;   rotator cuff surgery Right    SHOULDER ARTHROSCOPY WITH OPEN ROTATOR CUFF REPAIR Right 08/31/2019   Procedure: RIGHT SHOULDER ARTHROSCOPY with open rotator cuff repair;  Surgeon: Margrette Taft BRAVO, MD;  Location: [facility];  Service: Orthopedics;  Laterality: Right;   SHOULDER CLOSED REDUCTION Right 04/16/2020   Procedure: CLOSED MANIPULATION RIGHT SHOULDER;  Surgeon: Margrette Taft BRAVO, MD;  Location: [facility];  Service: Orthopedics;  Laterality: Right;    FAMILY HISTORY: Family History  Problem Relation Age of Onset   Cancer Mother     ADVANCED DIRECTIVES (Y/N):  N  HEALTH MAINTENANCE: Social History   Tobacco Use   Smoking status: Never   Smokeless tobacco: Never  Vaping Use   Vaping status: Never Used  Substance Use Topics   Alcohol use:  No   Drug use: No     Colonoscopy:  PAP:  Bone density:  Lipid panel:  Allergies  Allergen Reactions   Shellfish Allergy Anaphylaxis    Current Outpatient Medications  Medication Sig Dispense Refill   DULoxetine  (CYMBALTA ) 30 MG capsule Take 1 capsule (30 mg total) by mouth daily. 30 capsule 3   EPINEPHrine  0.3 mg/0.3 mL IJ SOAJ injection Inject 0.3 mg into the muscle as needed for anaphylaxis. 1 each 1   hydrOXYzine  (VISTARIL ) 25 MG capsule Take 1 capsule  (25 mg total) by mouth every 8 (eight) hours as needed. 30 capsule 0   lidocaine  (LIDODERM ) 5 % Place 1 patch onto the skin daily. Remove & Discard patch within 12 hours or as directed by MD 30 patch 0   oxyCODONE  (ROXICODONE ) 5 MG immediate release tablet Take 1 tablet (5 mg total) by mouth every 6 (six) hours as needed for severe pain (pain score 7-10). 12 tablet 0   RIVAROXABAN  (XARELTO ) VTE STARTER PACK (15 & 20 MG) Take 15 mg by mouth 2 (two) times daily for 21 days, THEN 20 mg daily for 21 days. Take with food. 51 each 0   sildenafil  (REVATIO ) 20 MG tablet Take by mouth.     No current facility-administered medications for this visit.    OBJECTIVE: Vitals:   06/22/24 0807  BP: 117/71  Pulse: 61  Resp: 16  Temp: (!) 97.5 F (36.4 C)  SpO2: 95%     Body mass index is 25.44 kg/m.    ECOG FS:0 - Asymptomatic  Physical Exam Constitutional:      Appearance: Normal appearance.  Cardiovascular:     Rate and Rhythm: Normal rate and regular rhythm.  Pulmonary:     Effort: Pulmonary effort is normal.     Breath sounds: Normal breath sounds.  Abdominal:     General: Bowel sounds are normal.     Palpations: Abdomen is soft.  Musculoskeletal:        General: No swelling. Normal range of motion.  Neurological:     Mental Status: He is alert and oriented to person, place, and time. Mental status is at baseline.      LAB RESULTS:  Lab Results  Component Value Date   NA 137 06/09/2024   K 3.7 06/09/2024   CL 103 06/09/2024   CO2 27 06/09/2024   GLUCOSE 128 (H) 06/09/2024   BUN 11 06/09/2024   CREATININE 0.74 06/09/2024   CALCIUM  8.7 (L) 06/09/2024   PROT 6.7 06/09/2024   ALBUMIN 3.0 (L) 06/09/2024   AST 21 06/09/2024   ALT 30 06/09/2024   ALKPHOS 62 06/09/2024   BILITOT 0.7 06/09/2024   GFRNONAA >60 06/09/2024   GFRAA >60 07/18/2020    Lab Results  Component Value Date   WBC 12.4 (H) 06/09/2024   NEUTROABS 8.2 (H) 06/09/2024   HGB 15.5 06/09/2024   HCT 46.7  06/09/2024   MCV 85.1 06/09/2024   PLT 385 06/09/2024     STUDIES: CT Angio Chest PE W and/or Wo Contrast Addendum Date: 06/08/2024 ADDENDUM REPORT: 06/08/2024 12:06 ADDENDUM: Critical Value/emergent results were called by telephone at the time of interpretation on 06/08/2024 at 10:44 am to provider JULIE HAVILAND , who verbally acknowledged these results. Electronically Signed   By: Ree Molt M.D.   On: 06/08/2024 12:06   Result Date: 06/08/2024 CLINICAL DATA:  Pulmonary embolism (PE) suspected, high prob. Chest pain with shortness of breath. EXAM: CT ANGIOGRAPHY CHEST WITH CONTRAST TECHNIQUE:  Multidetector CT imaging of the chest was performed using the standard protocol during bolus administration of intravenous contrast. Multiplanar CT image reconstructions and MIPs were obtained to evaluate the vascular anatomy. RADIATION DOSE REDUCTION: This exam was performed according to the departmental dose-optimization program which includes automated exposure control, adjustment of the mA and/or kV according to patient size and/or use of iterative reconstruction technique. CONTRAST:  75mL OMNIPAQUE  IOHEXOL  350 MG/ML SOLN COMPARISON:  None Available. FINDINGS: Cardiovascular: Small to medium volume acute pulmonary embolism seen in the right-sided pulmonary artery branches. In the right lung lower lobe lobar pulmonary artery branch there is moderate to large volume nonocclusive pulmonary embolism with extension into the proximal portion of segmental pulmonary arteries. There is also occlusive thrombus in the middle lobe segmental pulmonary artery branch. No right heart strain or lung infarction. Normal cardiac size. No pericardial effusion. No aortic aneurysm. Mediastinum/Nodes: Visualized thyroid gland appears grossly unremarkable. No solid / cystic mediastinal masses. The esophagus is nondistended precluding optimal assessment. No axillary, mediastinal or hilar lymphadenopathy by size criteria. Lungs/Pleura:  The central tracheo-bronchial tree is patent. There are dependent changes in bilateral lungs. No mass or consolidation. No pleural effusion or pneumothorax. No suspicious lung nodules. Upper Abdomen: Visualized upper abdominal viscera within normal limits. Musculoskeletal: The visualized soft tissues of the chest wall are grossly unremarkable. No suspicious osseous lesions. There are mild multilevel degenerative changes in the visualized spine. Review of the MIP images confirms the above findings. IMPRESSION: 1. There is small to medium volume acute pulmonary embolism in the right-sided pulmonary artery branches, as described above. No right heart strain or lung infarction. 2. No lung mass, consolidation, pleural effusion or pneumothorax. 3. Otherwise essentially unremarkable exam, as described above. Electronically Signed: By: Ree Molt M.D. On: 06/08/2024 10:41   US  Venous Img Lower Unilateral Right (DVT) Result Date: 06/08/2024 CLINICAL DATA:  Right lower extremity pain and edema. EXAM: RIGHT LOWER EXTREMITY VENOUS DOPPLER ULTRASOUND TECHNIQUE: Gray-scale sonography with graded compression, as well as color Doppler and duplex ultrasound were performed to evaluate the lower extremity deep venous systems from the level of the common femoral vein and including the common femoral, femoral, profunda femoral, popliteal and calf veins including the posterior tibial, peroneal and gastrocnemius veins when visible. The superficial great saphenous vein was also interrogated. Spectral Doppler was utilized to evaluate flow at rest and with distal augmentation maneuvers in the common femoral, femoral and popliteal veins. COMPARISON:  None Available. FINDINGS: Contralateral Common Femoral Vein: Respiratory phasicity is normal and symmetric with the symptomatic side. No evidence of thrombus. Normal compressibility. Common Femoral Vein: Nonocclusive thrombus present within the distal aspect of the common femoral vein  approaching the saphenofemoral junction. Saphenofemoral Junction: The saphenofemoral junction itself is normally patent. Profunda Femoral Vein: Nonocclusive thrombus near the origin of the profunda femoral vein but no thrombus within the profunda femoral vein itself. Femoral Vein: Essentially occlusive thrombus within the right femoral vein with venous distension. A small segment of the proximal femoral vein demonstrates some partial flow. Popliteal Vein: Occlusive thrombus in the right popliteal vein. Calf Veins: Thrombus visualized in segments of posterior tibial and peroneal veins as well as likely gastrocnemius vein. Superficial Great Saphenous Vein: No evidence of thrombus. Normal compressibility. Venous Reflux:  None. Other Findings: No evidence of superficial thrombophlebitis or abnormal fluid collection. IMPRESSION: Extensive right lower extremity deep venous thrombosis involving the common femoral, femoral popliteal, posterior tibial, peroneal and gastrocnemius veins. Electronically Signed   By: Marcey Luverne HERO.D.  On: 06/08/2024 09:20    ASSESSMENT: Mr. 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  He will be switched to Eliquis this month due to cost.  PLAN:   -He does not need any additional lab work done today. -I discussed case with Dr. Rogers who reviewed his labs from the emergency room. -He was positive for factor V Leiden heterozygous and had a positive lupus anticoagulant. -Given blood clot was unprovoked and extensive, Dr. Rogers recommends lifelong anticoagulation.  There is no need to check lupus anticoagulant unless he is wanting to stop his anticoagulation but he must be off of Eliquis for at least 30 days for this to be accurate.  This can also be elevated in the presence of a fresh clot.   -We discussed returning to clinic in approximately 6 months with labs a few days before.  -We discussed in detail the signs and  symptoms of a new blood clot along with side effects of blood thinners.  He will call us  with any concerns or questions.  Patient expressed understanding and was in agreement with this plan. He also understands that He can call clinic at any time with any questions, concerns, or complaints.    Cancer Staging  No matching staging information was found for the patient.   Delon FORBES Hope, NP   06/22/2024 9:04 AM  .[email or web address]

## 2024-06-22 ENCOUNTER — Encounter: Payer: Self-pay | Admitting: Family Medicine

## 2024-06-22 ENCOUNTER — Inpatient Hospital Stay: Attending: Oncology | Admitting: Oncology

## 2024-06-22 ENCOUNTER — Inpatient Hospital Stay

## 2024-06-22 ENCOUNTER — Encounter: Payer: Self-pay | Admitting: Oncology

## 2024-06-22 VITALS — BP 117/71 | HR 61 | Temp 97.5°F | Resp 16 | Ht 72.0 in | Wt 187.6 lb

## 2024-06-22 DIAGNOSIS — Z79899 Other long term (current) drug therapy: Secondary | ICD-10-CM | POA: Insufficient documentation

## 2024-06-22 DIAGNOSIS — D6851 Activated protein C resistance: Secondary | ICD-10-CM | POA: Insufficient documentation

## 2024-06-22 DIAGNOSIS — R600 Localized edema: Secondary | ICD-10-CM | POA: Insufficient documentation

## 2024-06-22 DIAGNOSIS — Z86718 Personal history of other venous thrombosis and embolism: Secondary | ICD-10-CM | POA: Insufficient documentation

## 2024-06-22 DIAGNOSIS — Z800 Family history of malignant neoplasm of digestive organs: Secondary | ICD-10-CM | POA: Diagnosis not present

## 2024-06-22 DIAGNOSIS — I82419 Acute embolism and thrombosis of unspecified femoral vein: Secondary | ICD-10-CM | POA: Insufficient documentation

## 2024-06-22 DIAGNOSIS — I82441 Acute embolism and thrombosis of right tibial vein: Secondary | ICD-10-CM | POA: Diagnosis present

## 2024-06-22 DIAGNOSIS — Z7901 Long term (current) use of anticoagulants: Secondary | ICD-10-CM | POA: Diagnosis not present

## 2024-06-22 DIAGNOSIS — I82491 Acute embolism and thrombosis of other specified deep vein of right lower extremity: Secondary | ICD-10-CM | POA: Diagnosis not present

## 2024-06-22 DIAGNOSIS — Z86711 Personal history of pulmonary embolism: Secondary | ICD-10-CM | POA: Diagnosis not present

## 2024-07-03 ENCOUNTER — Other Ambulatory Visit (HOSPITAL_COMMUNITY): Payer: Self-pay

## 2024-07-03 ENCOUNTER — Telehealth: Payer: Self-pay | Admitting: Student-PharmD

## 2024-07-03 ENCOUNTER — Other Ambulatory Visit: Payer: Self-pay

## 2024-07-03 MED ORDER — APIXABAN 5 MG PO TABS
5.0000 mg | ORAL_TABLET | Freq: Two times a day (BID) | ORAL | 5 refills | Status: DC
  Filled 2024-07-03: qty 60, 30d supply, fill #0
  Filled 2024-08-08: qty 180, 90d supply, fill #1

## 2024-07-03 NOTE — Telephone Encounter (Signed)
 Due to difficulties with patient accessing Xarelto  copay cards (see phone note from 06/15/24), we have planned to switch him to Eliquis  which has a more accessible copay program so that he can access the medication and be more likely to adhere to treatment. I have activated his copay card for him and sent the prescription to [facility] for home delivery with the copay card information. Provided phone number to [facility] to patient's son to set up payment. Discussed how to transition from Xarelto  to Eliquis . Patient/son will call if any questions come up. Will defer further refills/management to hematology.

## 2024-07-04 ENCOUNTER — Other Ambulatory Visit: Payer: Self-pay | Admitting: Family Medicine

## 2024-07-24 ENCOUNTER — Ambulatory Visit: Payer: Medicare Other | Admitting: Orthopedic Surgery

## 2024-07-31 ENCOUNTER — Ambulatory Visit (INDEPENDENT_AMBULATORY_CARE_PROVIDER_SITE_OTHER): Admitting: Orthopedic Surgery

## 2024-07-31 VITALS — BP 130/76 | HR 49 | Ht 73.0 in | Wt 186.0 lb

## 2024-07-31 DIAGNOSIS — G894 Chronic pain syndrome: Secondary | ICD-10-CM

## 2024-07-31 DIAGNOSIS — Z9889 Other specified postprocedural states: Secondary | ICD-10-CM

## 2024-07-31 DIAGNOSIS — M75121 Complete rotator cuff tear or rupture of right shoulder, not specified as traumatic: Secondary | ICD-10-CM | POA: Diagnosis not present

## 2024-07-31 NOTE — Progress Notes (Addendum)
 Chief Complaint  Patient presents with   Shoulder Pain    Right-     Encounter Diagnoses  Name Primary?   Complete tear of right rotator cuff, unspecified whether traumatic Yes   S/P shoulder surgery    Chronic pain syndrome    History of failed repair of rotator cuff     HPI: Here for annual disability check  Status post arthroscopy right shoulder with biceps tenodesis in 2020 developed adhesive capsulitis and arthrofibrosis Status post right rotator cuff repair and MUA 04/16/2020  Biceps tenodesis in 2021 again  Range of motion abduction 90  Forward elevation in the scapular plane 100  Painful range of motion from 90- 100 degrees  External rotation limited 10 to 15 degrees   Follow-up in a year for repeat disability examination

## 2024-07-31 NOTE — Progress Notes (Signed)
   There were no vitals taken for this visit.  There is no height or weight on file to calculate BMI.  Chief Complaint  Patient presents with   Shoulder Pain    Right-     No diagnosis found.  DOI/DOS/ Date: 07/23/20  Unchanged- hurts when I sleep on it and still have difficulty with ROM

## 2024-08-04 ENCOUNTER — Ambulatory Visit

## 2024-08-08 ENCOUNTER — Other Ambulatory Visit (HOSPITAL_COMMUNITY): Payer: Self-pay

## 2024-08-08 ENCOUNTER — Other Ambulatory Visit: Payer: Self-pay

## 2024-08-08 ENCOUNTER — Telehealth: Payer: Self-pay | Admitting: Student-PharmD

## 2024-08-08 MED ORDER — APIXABAN 5 MG PO TABS
5.0000 mg | ORAL_TABLET | Freq: Two times a day (BID) | ORAL | 1 refills | Status: AC
  Filled 2024-08-08: qty 180, 90d supply, fill #0
  Filled 2024-10-31: qty 180, 90d supply, fill #1

## 2024-08-08 NOTE — Telephone Encounter (Signed)
 Patient called reporting that he ran out of Eliquis  today. He signed up for home delivery through our pharmacy and is wondering if it is on the way or if he needs to find another way to get it today. I've reached out to the pharmacy to look into this and I'll give the patient a call back with an update today.

## 2024-08-08 NOTE — Telephone Encounter (Signed)
 Discussed with pharmacist, sees that payment was set up but refill was not requested. They will get it sent out today and let him know. Counseled patient that he will need to call the pharmacy before he runs out in the future to set up the next shipment. I sent a new Rx for 90 day supplies to prevent him needing to request it as often and he was appreciative.

## 2024-10-09 ENCOUNTER — Ambulatory Visit: Admitting: Family Medicine

## 2024-10-30 ENCOUNTER — Encounter (INDEPENDENT_AMBULATORY_CARE_PROVIDER_SITE_OTHER): Payer: Self-pay | Admitting: *Deleted

## 2024-11-05 ENCOUNTER — Other Ambulatory Visit: Payer: Self-pay | Admitting: Family Medicine

## 2024-11-22 ENCOUNTER — Ambulatory Visit: Admitting: Family Medicine

## 2024-11-22 VITALS — BP 130/69 | HR 53 | Temp 98.6°F | Ht 73.0 in | Wt 221.0 lb

## 2024-11-22 DIAGNOSIS — L509 Urticaria, unspecified: Secondary | ICD-10-CM | POA: Insufficient documentation

## 2024-11-22 MED ORDER — SILDENAFIL CITRATE 20 MG PO TABS: 20.0000 mg | ORAL_TABLET | Freq: Every day | ORAL | 6 refills | Status: DC | PRN

## 2024-11-22 MED ORDER — FAMOTIDINE 20 MG PO TABS: 20.0000 mg | ORAL_TABLET | Freq: Two times a day (BID) | ORAL | 1 refills | Status: DC

## 2024-11-22 MED ORDER — PREDNISONE 10 MG PO TABS: ORAL_TABLET | ORAL | 0 refills | Status: DC

## 2024-11-22 MED ORDER — CETIRIZINE HCL 10 MG PO TABS: 10.0000 mg | ORAL_TABLET | Freq: Every day | ORAL | 1 refills | Status: DC

## 2024-11-22 NOTE — Progress Notes (Signed)
 Subjective:  Patient ID: Mario Meyer, male    DOB: 13-May-1961  Age: 63 y.o. MRN: [id number]  CC:   Chief Complaint  Patient presents with   rash unexplained 3 weeks    All over body some days worse than others, located on back, shoulders, hips Taken benadryl by mouth    HPI:  63 year old male presents for evaluation of rash.  3-week history of diffuse, raised, erythematous rash which is very pruritic.  Photos reviewed today.  Consistent with hives.  No known inciting factor.  No relieving factors.  He states that this is very troublesome.  Reports of facial swelling.  He has not had to use his epinephrine  pen.  Social Hx   Social History   Socioeconomic History   Marital status: Married    Spouse name: Not on file   Number of children: Not on file   Years of education: Not on file   Highest education level: Not on file  Occupational History   Not on file  Tobacco Use   Smoking status: Never   Smokeless tobacco: Never  Vaping Use   Vaping status: Never Used  Substance and Sexual Activity   Alcohol use: No   Drug use: No   Sexual activity: Yes  Other Topics Concern   Not on file  Social History Narrative   Not on file   Social Drivers of Health   Tobacco Use: Low Risk (06/22/2024)   Patient History    Smoking Tobacco Use: Never    Smokeless Tobacco Use: Never    Passive Exposure: Not on file  Financial Resource Strain: Not on file  Food Insecurity: No Food Insecurity (06/22/2024)   Epic    Worried About Programme Researcher, Broadcasting/film/video in the Last Year: Never true    Ran Out of Food in the Last Year: Never true  Transportation Needs: No Transportation Needs (06/22/2024)   Epic    Lack of Transportation (Medical): No    Lack of Transportation (Non-Medical): No  Physical Activity: Not on file  Stress: Not on file  Social Connections: Not on file  Depression (PHQ2-9): Medium Risk (11/22/2024)   Depression (PHQ2-9)    PHQ-2 Score: 9  Alcohol Screen: Not on file   Housing: Low Risk (06/22/2024)   Epic    Unable to Pay for Housing in the Last Year: No    Number of Times Moved in the Last Year: 0    Homeless in the Last Year: No  Utilities: Not At Risk (06/22/2024)   Epic    Threatened with loss of utilities: No  Health Literacy: Not on file    Review of Systems Per HPI  Objective:  BP 130/69   Pulse (!) 53   Temp 98.6 F (37 C)   Ht 6' 1 (1.854 m)   Wt 221 lb (100.2 kg)   SpO2 97%   BMI 29.16 kg/m      11/22/2024    9:53 AM 07/31/2024   10:38 AM 06/22/2024    8:07 AM  BP/Weight  Systolic BP 130 130 117  Diastolic BP 69 76 71  Wt. (Lbs) 221 186 187.61  BMI 29.16 kg/m2 24.54 kg/m2 25.44 kg/m2    Physical Exam Vitals and nursing note reviewed.  Constitutional:      General: He is not in acute distress.    Appearance: Normal appearance.  HENT:     Head: Normocephalic and atraumatic.  Pulmonary:     Effort: Pulmonary effort is  normal. No respiratory distress.  Skin:    Comments: Scattered areas of erythema mostly noted on the back.  Neurological:     Mental Status: He is alert.     Lab Results  Component Value Date   WBC 12.4 (H) 06/09/2024   HGB 15.5 06/09/2024   HCT 46.7 06/09/2024   PLT 385 06/09/2024   GLUCOSE 128 (H) 06/09/2024   CHOL 207 (H) 07/26/2023   TRIG 245 (H) 07/26/2023   HDL 56 07/26/2023   LDLCALC 109 (H) 07/26/2023   ALT 30 06/09/2024   AST 21 06/09/2024   NA 137 06/09/2024   K 3.7 06/09/2024   CL 103 06/09/2024   CREATININE 0.74 06/09/2024   BUN 11 06/09/2024   CO2 27 06/09/2024   PSA 2.20 12/22/2014     Assessment & Plan:  Urticaria Assessment & Plan: Treating with Prednisone , Zyrtec , and Pepcid .  Orders: -     predniSONE ; 50 mg daily x 3 days, then 40 mg daily x 3 days, then 30 mg daily x 3 days, then 20 mg daily x 3 days, then 10 mg daily x 3 days.  Dispense: 45 tablet; Refill: 0 -     Cetirizine  HCl; Take 1 tablet (10 mg total) by mouth daily.  Dispense: 30 tablet; Refill: 1 -      Famotidine ; Take 1 tablet (20 mg total) by mouth 2 (two) times daily.  Dispense: 60 tablet; Refill: 1  Other orders -     Sildenafil  Citrate; Take 1 tablet (20 mg total) by mouth daily as needed.  Dispense: 10 tablet; Refill: 6    Follow-up:  Return if symptoms worsen or fail to improve.  Jacqulyn Ahle DO [facility]

## 2024-11-22 NOTE — Patient Instructions (Addendum)
Medications as prescribed.  If continues to persist, please let me know.  Take care  Dr. Veleka Djordjevic  

## 2024-11-22 NOTE — Assessment & Plan Note (Signed)
 Treating with Prednisone , Zyrtec , and Pepcid .

## 2024-12-14 ENCOUNTER — Other Ambulatory Visit: Payer: Self-pay | Admitting: Family Medicine

## 2024-12-14 DIAGNOSIS — L509 Urticaria, unspecified: Secondary | ICD-10-CM

## 2024-12-15 ENCOUNTER — Other Ambulatory Visit: Payer: Self-pay | Admitting: Family Medicine

## 2024-12-15 DIAGNOSIS — L509 Urticaria, unspecified: Secondary | ICD-10-CM

## 2024-12-18 ENCOUNTER — Inpatient Hospital Stay: Attending: Oncology

## 2024-12-18 DIAGNOSIS — I82491 Acute embolism and thrombosis of other specified deep vein of right lower extremity: Secondary | ICD-10-CM

## 2024-12-18 LAB — COMPREHENSIVE METABOLIC PANEL WITH GFR
ALT: 14 U/L (ref 0–44)
AST: 14 U/L — ABNORMAL LOW (ref 15–41)
Albumin: 4.4 g/dL (ref 3.5–5.0)
Alkaline Phosphatase: 56 U/L (ref 38–126)
Anion gap: 13 (ref 5–15)
BUN: 14 mg/dL (ref 8–23)
CO2: 25 mmol/L (ref 22–32)
Calcium: 9.5 mg/dL (ref 8.9–10.3)
Chloride: 105 mmol/L (ref 98–111)
Creatinine, Ser: 0.95 mg/dL (ref 0.61–1.24)
GFR, Estimated: >60 mL/min
Glucose, Bld: 73 mg/dL (ref 70–99)
Potassium: 4.2 mmol/L (ref 3.5–5.1)
Sodium: 143 mmol/L (ref 135–145)
Total Bilirubin: 0.7 mg/dL (ref 0.0–1.2)
Total Protein: 7.3 g/dL (ref 6.5–8.1)

## 2024-12-18 LAB — CBC WITH DIFFERENTIAL/PLATELET
Abs Immature Granulocytes: 0.03 K/uL (ref 0.00–0.07)
Basophils Absolute: 0.1 K/uL (ref 0.0–0.1)
Basophils Relative: 1 %
Eosinophils Absolute: 0.3 K/uL (ref 0.0–0.5)
Eosinophils Relative: 3 %
HCT: 55 % — ABNORMAL HIGH (ref 39.0–52.0)
Hemoglobin: 18.1 g/dL — ABNORMAL HIGH (ref 13.0–17.0)
Immature Granulocytes: 0 %
Lymphocytes Relative: 25 %
Lymphs Abs: 2.7 K/uL (ref 0.7–4.0)
MCH: 28.8 pg (ref 26.0–34.0)
MCHC: 32.9 g/dL (ref 30.0–36.0)
MCV: 87.4 fL (ref 80.0–100.0)
Monocytes Absolute: 1.2 K/uL — ABNORMAL HIGH (ref 0.1–1.0)
Monocytes Relative: 11 %
Neutro Abs: 6.7 K/uL (ref 1.7–7.7)
Neutrophils Relative %: 60 %
Platelets: 284 K/uL (ref 150–400)
RBC: 6.29 MIL/uL — ABNORMAL HIGH (ref 4.22–5.81)
RDW: 13.6 % (ref 11.5–15.5)
WBC: 11 K/uL — ABNORMAL HIGH (ref 4.0–10.5)
nRBC: 0 % (ref 0.0–0.2)

## 2024-12-18 LAB — D-DIMER, QUANTITATIVE: D-Dimer, Quant: <0.27 ug{FEU}/mL (ref 0.00–0.50)

## 2024-12-21 NOTE — Progress Notes (Signed)
 "  [facility] [address], [place] [address]   CLINIC:  Medical Oncology/Hematology  PCP:  Alphonsa Glendia LABOR, MD [address] [place] [address] [phone]   REASON FOR VISIT:  Follow-up for unprovoked DVT and PE  PRIOR THERAPY: Xarelto  (switched to Eliquis  due to cost)  CURRENT THERAPY: Eliquis   INTERVAL HISTORY:   Mr. Mario Meyer 64 y.o. male returns for routine follow-up of unprovoked DVT and PE. He was last seen on 06/17/2024 by NP Delon Hope.  In the interim since last visit, he has not had any surgeries, hospitalizations, or changes in baseline health status.   At today's visit, he reports feeling well.  He  reports 100% energy and 100% appetite.   He  is maintaining stable weight at this time. He remains active and overall feels well.    Patient denies any interval DVT or PE since last visit.   Right lower extremity pain resolved after initiation of anticoagulation.   He remains on Eliquis  to date and denies any major bleeding events. No symptoms concerning for recurrent DVT/PE such as new onset unilateral leg pain/swelling, hemoptysis, difficulty breathing, or chest pain. He denies any recent falls.  Labs from last week showed new erythrocytosis, which may be due to recent steroid taper. He is a lifelong non-smoker. He denies any diagnosis of sleep apnea, but has been told that he snores on occasion. No diuretics, testosterone supplements, or car monoxide exposure. He reports excellent hydration.  ASSESSMENT & PLAN:  1.  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  No lung masses noted. - Right lower extremity venous Doppler US  (06/08/2024) showed extensive right lower extremity DVT involving the femoral, popliteal, posterior tibial, peroneal, and gastrocnemius veins - No obvious  provoking factors.  He is a non-smoker. - He is active and very mobile.   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 Protein S, protein C, Antithrombin III  negative PT gene mutation negative - No prior history of blood clot.  No family history of blood clot. - Remains on Eliquis  to date.  Tolerating well without bleeding events.   - Denies any falls within the past year.   - No symptoms concerning for recurrent VTE. - Most recent labs (12/18/2024): D-dimer undetectable.  CMP normal.  CBC/D with erythrocytosis (addressed below). - PLAN:  Recommend indefinite anticoagulation due to extensive unprovoked DVT/PE as well as factor V Leiden heterozygosity. - Recommend rechecking lupus anticoagulant to determine if it was falsely positive in the past.   (May still be false positive in the presence of Eliquis ) - If lupus anticoagulant is negative, patient can discharge to PCP (in terms of DVT/PE), for ongoing management of chronic anticoagulation.    2.  Erythrocytosis - Labs from 12/18/2024 showed elevated hemoglobin/hematocrit (Hgb 18.1/hematocrit 55.0, WBC 11.0 with monocytes 1.2), which is apparently new - Patient started prednisone  taper x 2 weeks in 11/22/2024, finished steroid taper about 2 weeks ago - Prednisone  and other steroids can cause increased red blood cell production, usually seen within 4 to 6 weeks after starting corticosteroid therapy.   - He is a lifelong non-smoker. - Denies  any diagnosis of sleep apnea, but has been told that he snores on occasion. - No diuretics, testosterone supplements, or car monoxide exposure. - He reports excellent hydration. - PLAN: Recommend repeat CBC/D in the next 4 weeks with HOLD TUBE for possible  erythropoietin and JAK2 with reflex.  Further plan TBD based on those results.  3.  Age-appropriate cancer screenings - Colonoscopy by Dr. Shaaron (10/04/2015): Polyps x 2 (tubular adenoma), otherwise normal. - PLAN: Patient is OVERDUE for colonoscopy, was originally scheduled for colonoscopy with Dr. Shaaron in February 2024, but this was canceled.  Second attempt to schedule colonoscopy in June 2025, but not completed.  Referral for colonoscopy was placed by PCP in November 2025, but closed due to office being unable to contact patient.   - Discussed with patient the importance of age-appropriate cancer screenings, particularly in the setting of unprovoked VTE.    4.  Other history - Mother had colon cancer, diagnosed at age 31. - Patient is retired, helps to care for his father who had stroke. - Enjoys hunting and fishing.  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  PLAN SUMMARY: >> Referral has been entered to GI >> Labs in 4-6 weeks = CBC/D, lupus anticoagulant panel + HOLD TUBE x2 (possible erythropoietin + possible JAK2 with reflex) >> PHONE visit in 6-8 weeks      REVIEW OF SYSTEMS:   Review of Systems  Constitutional:  Negative for appetite change, chills, diaphoresis, fatigue, fever and unexpected weight change.  HENT:   Negative for lump/mass and nosebleeds.   Eyes:  Negative for eye problems.  Respiratory:  Negative for cough, hemoptysis and shortness of breath.   Cardiovascular:  Negative for chest pain, leg swelling and palpitations.  Gastrointestinal:  Negative for abdominal pain, blood in stool, constipation, diarrhea, nausea and vomiting.  Genitourinary:  Negative for hematuria.   Skin: Negative.   Neurological:  Negative for dizziness, headaches and light-headedness.  Hematological:  Does not bruise/bleed easily.     PHYSICAL EXAM:  ECOG PERFORMANCE STATUS: 0 - Asymptomatic  Vitals:   12/25/24 0847  BP: 127/86  Pulse: (!) 52  Resp: 18  Temp: 97.9 F (36.6 C)  SpO2: 99%    Filed Weights   12/25/24 0847  Weight: 221 lb 8 oz (100.5 kg)   Physical Exam Constitutional:      Appearance: Normal appearance. He is obese.  Cardiovascular:     Heart sounds: Normal heart sounds.  Pulmonary:     Breath sounds: Normal breath sounds.  Neurological:     General: No focal deficit present.     Mental Status: Mental status is at baseline.  Psychiatric:        Behavior: Behavior normal. Behavior is cooperative.     PAST MEDICAL/SURGICAL HISTORY:  Past Medical History:  Diagnosis Date   Panic attack    Past Surgical History:  Procedure Laterality Date   BICEPT TENODESIS Right 07/23/2020   Procedure: RIGHT SHOULDER OPEN BICEPS TENODESIS;  Surgeon: Margrette Taft BRAVO, MD;  Location: [facility];  Service: Orthopedics;  Laterality: Right;   COLONOSCOPY N/A 10/04/2015   Procedure: COLONOSCOPY;  Surgeon: Lamar CHRISTELLA Shaaron, MD;  Location: [facility];  Service: Endoscopy;  Laterality: N/A;  7:30 AM   KNEE ARTHROSCOPY W/ ACL RECONSTRUCTION Right    RIGHT/LEFT HEART CATH AND CORONARY ANGIOGRAPHY N/A 02/23/2019   Procedure: RIGHT/LEFT HEART CATH AND CORONARY ANGIOGRAPHY;  Surgeon: Claudene Victory ORN, MD;  Location: [facility];  Service: Cardiovascular;  Laterality: N/A;  rotator cuff surgery Right    SHOULDER ARTHROSCOPY WITH OPEN ROTATOR CUFF REPAIR Right 08/31/2019   Procedure: RIGHT SHOULDER ARTHROSCOPY with open rotator cuff repair;  Surgeon: Margrette Taft BRAVO, MD;  Location: [facility];  Service: Orthopedics;  Laterality: Right;   SHOULDER CLOSED REDUCTION Right 04/16/2020   Procedure: CLOSED MANIPULATION RIGHT SHOULDER;  Surgeon: Margrette Taft BRAVO, MD;  Location: [facility];  Service: Orthopedics;  Laterality: Right;    SOCIAL HISTORY:  Social History   Socioeconomic History   Marital status: Married    Spouse name: Not on file   Number of children: Not on file   Years of education: Not on file   Highest education level: Not on file  Occupational History   Not  on file  Tobacco Use   Smoking status: Never   Smokeless tobacco: Never  Vaping Use   Vaping status: Never Used  Substance and Sexual Activity   Alcohol use: No   Drug use: No   Sexual activity: Yes  Other Topics Concern   Not on file  Social History Narrative   Not on file   Social Drivers of Health   Tobacco Use: Low Risk (06/22/2024)   Patient History    Smoking Tobacco Use: Never    Smokeless Tobacco Use: Never    Passive Exposure: Not on file  Financial Resource Strain: Not on file  Food Insecurity: No Food Insecurity (06/22/2024)   Epic    Worried About Programme Researcher, Broadcasting/film/video in the Last Year: Never true    Ran Out of Food in the Last Year: Never true  Transportation Needs: No Transportation Needs (06/22/2024)   Epic    Lack of Transportation (Medical): No    Lack of Transportation (Non-Medical): No  Physical Activity: Not on file  Stress: Not on file  Social Connections: Not on file  Intimate Partner Violence: Not At Risk (06/22/2024)   Epic    Fear of Current or Ex-Partner: No    Emotionally Abused: No    Physically Abused: No    Sexually Abused: No  Depression (PHQ2-9): Low Risk (12/25/2024)   Depression (PHQ2-9)    PHQ-2 Score: 0  Recent Concern: Depression (PHQ2-9) - Medium Risk (11/22/2024)   Depression (PHQ2-9)    PHQ-2 Score: 9  Alcohol Screen: Not on file  Housing: Low Risk (06/22/2024)   Epic    Unable to Pay for Housing in the Last Year: No    Number of Times Moved in the Last Year: 0    Homeless in the Last Year: No  Utilities: Not At Risk (06/22/2024)   Epic    Threatened with loss of utilities: No  Health Literacy: Not on file    FAMILY HISTORY:  Family History  Problem Relation Age of Onset   Cancer Mother     CURRENT MEDICATIONS:  Outpatient Encounter Medications as of 12/25/2024  Medication Sig   apixaban  (ELIQUIS ) 5 MG TABS tablet Take 1 tablet (5 mg total) by mouth 2 (two) times daily.   cetirizine  (ZYRTEC ) 10 MG tablet TAKE 1 TABLET  BY MOUTH EVERY DAY   DULoxetine  (CYMBALTA ) 30 MG capsule TAKE 1 CAPSULE BY MOUTH EVERY DAY   EPINEPHrine  0.3 mg/0.3 mL IJ SOAJ injection Inject 0.3 mg into the muscle as needed for anaphylaxis.   famotidine  (PEPCID ) 20 MG tablet TAKE 1 TABLET BY MOUTH TWICE A DAY   hydrOXYzine  (VISTARIL ) 25 MG capsule Take 1 capsule (25 mg total) by mouth every 8 (eight) hours as  needed.   lidocaine  (LIDODERM ) 5 % 1 patch daily. Remove & discard patch within 12 hours or as directed by MD   predniSONE  (DELTASONE ) 10 MG tablet 50 mg daily x 3 days, then 40 mg daily x 3 days, then 30 mg daily x 3 days, then 20 mg daily x 3 days, then 10 mg daily x 3 days.   sildenafil  (REVATIO ) 20 MG tablet Take 1 tablet (20 mg total) by mouth daily as needed.   No facility-administered encounter medications on file as of 12/25/2024.    ALLERGIES:  Allergies[1]  LABORATORY DATA:  I have reviewed the labs as listed.  CBC    Component Value Date/Time   WBC 11.0 (H) 12/18/2024 0844   RBC 6.29 (H) 12/18/2024 0844   HGB 18.1 (H) 12/18/2024 0844   HGB 16.9 07/26/2023 0925   HCT 55.0 (H) 12/18/2024 0844   HCT 52.3 (H) 07/26/2023 0925   PLT 284 12/18/2024 0844   PLT 300 07/26/2023 0925   MCV 87.4 12/18/2024 0844   MCV 85 07/26/2023 0925   MCH 28.8 12/18/2024 0844   MCHC 32.9 12/18/2024 0844   RDW 13.6 12/18/2024 0844   RDW 13.4 07/26/2023 0925   LYMPHSABS 2.7 12/18/2024 0844   LYMPHSABS 1.9 07/26/2023 0925   MONOABS 1.2 (H) 12/18/2024 0844   EOSABS 0.3 12/18/2024 0844   EOSABS 0.3 07/26/2023 0925   BASOSABS 0.1 12/18/2024 0844   BASOSABS 0.1 07/26/2023 0925      Latest Ref Rng & Units 12/18/2024    8:44 AM 06/09/2024    9:48 PM 06/08/2024    9:35 AM  CMP  Glucose 70 - 99 mg/dL 73  871  881   BUN 8 - 23 mg/dL 14  11  15    Creatinine 0.61 - 1.24 mg/dL 9.04  9.25  9.28   Sodium 135 - 145 mmol/L 143  137  137   Potassium 3.5 - 5.1 mmol/L 4.2  3.7  3.4   Chloride 98 - 111 mmol/L 105  103  103   CO2 22 - 32 mmol/L 25   27  23    Calcium  8.9 - 10.3 mg/dL 9.5  8.7  9.0   Total Protein 6.5 - 8.1 g/dL 7.3  6.7  7.3   Total Bilirubin 0.0 - 1.2 mg/dL 0.7  0.7  0.8   Alkaline Phos 38 - 126 U/L 56  62  64   AST 15 - 41 U/L 14  21  18    ALT 0 - 44 U/L 14  30  25      DIAGNOSTIC IMAGING:  I have independently reviewed the relevant imaging and discussed with the patient.   WRAP UP:  All questions were answered. The patient knows to call the clinic with any problems, questions or concerns.  Medical decision making: Moderate  Time spent on visit: I spent 20 minutes counseling the patient face to face. The total time spent in the appointment was 30 minutes and more than 50% was on counseling.  Pleasant CHRISTELLA Barefoot, PA-C  12/25/24 9:49 AM      [1]  Allergies Allergen Reactions   Shellfish Allergy Anaphylaxis   "

## 2024-12-23 ENCOUNTER — Other Ambulatory Visit: Payer: Self-pay | Admitting: Family Medicine

## 2024-12-25 ENCOUNTER — Encounter: Payer: Self-pay | Admitting: Family Medicine

## 2024-12-25 ENCOUNTER — Inpatient Hospital Stay: Admitting: Physician Assistant

## 2024-12-25 ENCOUNTER — Other Ambulatory Visit: Payer: Self-pay

## 2024-12-25 VITALS — BP 127/86 | HR 52 | Temp 97.9°F | Resp 18 | Ht 73.0 in | Wt 221.5 lb

## 2024-12-25 DIAGNOSIS — I82491 Acute embolism and thrombosis of other specified deep vein of right lower extremity: Secondary | ICD-10-CM | POA: Diagnosis not present

## 2024-12-25 DIAGNOSIS — Z860100 Personal history of colon polyps, unspecified: Secondary | ICD-10-CM

## 2024-12-25 DIAGNOSIS — I2699 Other pulmonary embolism without acute cor pulmonale: Secondary | ICD-10-CM

## 2024-12-25 DIAGNOSIS — D751 Secondary polycythemia: Secondary | ICD-10-CM | POA: Diagnosis not present

## 2024-12-25 MED ORDER — SILDENAFIL CITRATE 20 MG PO TABS: 20.0000 mg | ORAL_TABLET | Freq: Every day | ORAL | 6 refills | Status: DC | PRN

## 2024-12-25 NOTE — Patient Instructions (Signed)
 [facility] at [facility] **VISIT SUMMARY & IMPORTANT INSTRUCTIONS **   You were seen today by Pleasant Barefoot PA-C for your follow-up visit.    BLOOD CLOTS: Since your blood clots were unprovoked, you will need to be on lifelong blood thinner. Continue taking Eliquis  twice daily. Seek IMMEDIATE medical attention... If you have any evidence of major bleeding such as bright red blood in the toilet or black tarry bowel movements. If you have any symptoms of another blood clot, such as new onset swelling and pain in one leg more than the other, chest pain, difficulty breathing, or coughing up blood. We will recheck your lupus anticoagulant panel in about 1 month to make sure that you do not have any underlying blood clotting disorder.  ELEVATED RED BLOOD CELLS Your labs from 12/18/2024 showed elevated hemoglobin/red blood cells. This may be related to your prednisone  medication from December/January. However, since elevated red blood cells can also cause blood clots, we will recheck your blood count in 1 month.  If it is still elevated, we will check additional tests to look for any DNA mutation that could be causing your bone marrow to make too many blood cells.  OTHER TESTS: Unprovoked blood clots can sometimes be seen in the setting of undiagnosed cancer.  For this reason, it is important that you stay up-to-date on age-appropriate cancer screenings.  You are currently overdue for your colonoscopy.  Please contact Dr. Ivonne office [facility] at [phone] to schedule office visit and colonoscopy.  FOLLOW-UP APPOINTMENT: - Labs in 4 weeks - Phone visit in about 6 weeks  ** Thank you for trusting me with your healthcare!  I strive to provide all of my patients with quality care at each visit.  If you receive a survey for this visit, I would be so grateful to you for taking the time to provide feedback.  Thank you in advance!  ~ Willene Holian                                         Dr. Mickiel Davonna Pleasant Barefoot, PA-C          Delon Hope, NP   - - - - - - - - - - - - - - - - - -    Thank you for choosing [facility] at [facility] to provide your oncology and hematology care.  To afford each patient quality time with our provider, please arrive at least 15 minutes before your scheduled appointment time.   If you have a lab appointment with the Cancer Center please come in thru the Main Entrance and check in at the main information desk.  You need to re-schedule your appointment should you arrive 10 or more minutes late.  We strive to give you quality time with our providers, and arriving late affects you and other patients whose appointments are after yours.  Also, if you no show three or more times for appointments you may be dismissed from the clinic at the providers discretion.     Again, thank you for choosing [facility].  Our hope is that these requests will decrease the amount of time that you wait before being seen by our physicians.       _____________________________________________________________  Should you have questions after your  visit to [facility], please contact our office at [phone] and follow the prompts.  Our office hours are 8:00 a.m. and 4:30 p.m. Monday - Friday.  Please note that voicemails left after 4:00 p.m. may not be returned until the following business day.  We are closed weekends and major holidays.  You do have access to a nurse 24-7, just call the main number to the clinic [phone] and do not press any options, hold on the line and a nurse will answer the phone.    For prescription refill requests, have your pharmacy contact our office and allow 72 hours.

## 2025-01-03 ENCOUNTER — Encounter: Payer: Self-pay | Admitting: Gastroenterology

## 2025-01-10 ENCOUNTER — Ambulatory Visit: Admitting: Family Medicine

## 2025-01-10 ENCOUNTER — Ambulatory Visit: Payer: Self-pay | Admitting: Family Medicine

## 2025-01-10 ENCOUNTER — Ambulatory Visit (HOSPITAL_COMMUNITY)
Admission: RE | Admit: 2025-01-10 | Discharge: 2025-01-10 | Disposition: A | Source: Ambulatory Visit | Attending: Family Medicine | Admitting: Family Medicine

## 2025-01-10 VITALS — BP 132/70 | HR 64 | Temp 99.7°F | Ht 73.0 in | Wt 219.2 lb

## 2025-01-10 DIAGNOSIS — Z0000 Encounter for general adult medical examination without abnormal findings: Secondary | ICD-10-CM

## 2025-01-10 DIAGNOSIS — R351 Nocturia: Secondary | ICD-10-CM

## 2025-01-10 DIAGNOSIS — N401 Enlarged prostate with lower urinary tract symptoms: Secondary | ICD-10-CM

## 2025-01-10 DIAGNOSIS — Z125 Encounter for screening for malignant neoplasm of prostate: Secondary | ICD-10-CM

## 2025-01-10 DIAGNOSIS — E785 Hyperlipidemia, unspecified: Secondary | ICD-10-CM | POA: Diagnosis not present

## 2025-01-10 DIAGNOSIS — Z0001 Encounter for general adult medical examination with abnormal findings: Secondary | ICD-10-CM | POA: Diagnosis not present

## 2025-01-10 DIAGNOSIS — Z79899 Other long term (current) drug therapy: Secondary | ICD-10-CM | POA: Diagnosis not present

## 2025-01-10 DIAGNOSIS — Z9181 History of falling: Secondary | ICD-10-CM | POA: Diagnosis not present

## 2025-01-10 DIAGNOSIS — R0789 Other chest pain: Secondary | ICD-10-CM | POA: Diagnosis not present

## 2025-01-10 DIAGNOSIS — W19XXXA Unspecified fall, initial encounter: Secondary | ICD-10-CM

## 2025-01-10 MED ORDER — HYDROCODONE-ACETAMINOPHEN 5-325 MG PO TABS: 1.0000 | ORAL_TABLET | ORAL | 0 refills | Status: AC | PRN

## 2025-01-10 MED ORDER — SILDENAFIL CITRATE 20 MG PO TABS: ORAL_TABLET | ORAL | 4 refills | Status: AC
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
## 2025-01-11 ENCOUNTER — Telehealth: Payer: Self-pay | Admitting: Family Medicine

## 2025-01-11 NOTE — Telephone Encounter (Signed)
 Nurses Please try calling the patient with the results of his x-rays Make sure that he saw my message See message below Let the patient know that I did try to call him as well as send him a MyChart message If you try a couple different times and unsuccessful please send message back to me Thank you so much-Dr. Glendia   Hi Mario Meyer-your x-ray shows fracture of the fifth and the eighth rib on the left side. This will gradually heal itself over the course of the next 8 to 12 weeks but during that time you will probably have significant pain and discomfort for the next few weeks then it will gradually get better. You can do mild activity as tolerated but I would avoid any type of very heavy lifting. If you are not seeing significant improvement over the next 8 weeks let us  know. If you feel you are running into any problems let me know. I tried calling your phone but all I got was the voicemail and it was full-if you are having additional questions send me a message-thanks-Dr. Glendia

## 2025-01-12 NOTE — Telephone Encounter (Signed)
 Essentially he has a broken bone in 2 places-2 different ribs Any twisting rolling over in bed sitting up etc. will cause movement of those bones which in turn causes significant pain unfortunately pain medicine will not totally take away the pain and we will just take the edge off the pain if he feels the pain is unbearable next week we can try different medicine to take the edge off But unfortunately no matter how we approach this the pain from fractured ribs take several weeks to get better

## 2025-01-12 NOTE — Telephone Encounter (Signed)
 Called patient and he verbalized he understood everything. He stated he would give us  a call if any other pain or anything worsens over the next few weeks.  Patient stated he does not feel like the medication is helping. I told him since he has took it for 2 days to see if it starts to kick in within a few more days. Stated to patient if it don't feel like it is helping to give us  a call back

## 2025-01-12 NOTE — Telephone Encounter (Signed)
 Sent patient a message with further information regarding his fall per Dr.

## 2025-01-25 ENCOUNTER — Ambulatory Visit

## 2025-02-05 ENCOUNTER — Inpatient Hospital Stay: Attending: Oncology

## 2025-02-06 ENCOUNTER — Ambulatory Visit: Admitting: Gastroenterology

## 2025-02-20 ENCOUNTER — Inpatient Hospital Stay: Admitting: Physician Assistant
